# Patient Record
Sex: Female | Born: 1935 | Race: White | Hispanic: No | Smoking: Former smoker
Health system: Southern US, Community
[De-identification: ages and names within clinical notes are randomized; demographics above are authoritative.]

## PROBLEM LIST (undated history)

## (undated) DIAGNOSIS — I2510 Atherosclerotic heart disease of native coronary artery without angina pectoris: Secondary | ICD-10-CM

## (undated) DIAGNOSIS — Z87440 Personal history of urinary (tract) infections: Secondary | ICD-10-CM

## (undated) DIAGNOSIS — K219 Gastro-esophageal reflux disease without esophagitis: Secondary | ICD-10-CM

## (undated) DIAGNOSIS — Z8719 Personal history of other diseases of the digestive system: Secondary | ICD-10-CM

## (undated) DIAGNOSIS — N183 Chronic kidney disease, stage 3 unspecified: Secondary | ICD-10-CM

## (undated) DIAGNOSIS — E039 Hypothyroidism, unspecified: Secondary | ICD-10-CM

## (undated) DIAGNOSIS — E785 Hyperlipidemia, unspecified: Secondary | ICD-10-CM

## (undated) DIAGNOSIS — J449 Chronic obstructive pulmonary disease, unspecified: Secondary | ICD-10-CM

## (undated) DIAGNOSIS — G309 Alzheimer's disease, unspecified: Secondary | ICD-10-CM

## (undated) DIAGNOSIS — F419 Anxiety disorder, unspecified: Secondary | ICD-10-CM

## (undated) DIAGNOSIS — F0280 Dementia in other diseases classified elsewhere without behavioral disturbance: Secondary | ICD-10-CM

## (undated) DIAGNOSIS — I4891 Unspecified atrial fibrillation: Principal | ICD-10-CM

## (undated) DIAGNOSIS — I10 Essential (primary) hypertension: Secondary | ICD-10-CM

## (undated) HISTORY — PX: OTHER SURGICAL HISTORY: SHX169

## (undated) HISTORY — PX: TOTAL VAGINAL HYSTERECTOMY: SHX2548

## (undated) HISTORY — PX: ABDOMINAL HYSTERECTOMY: SHX81

## (undated) HISTORY — PX: CORONARY ANGIOPLASTY WITH STENT PLACEMENT: SHX49

---

## 1997-07-29 ENCOUNTER — Ambulatory Visit (HOSPITAL_COMMUNITY): Admission: RE | Admit: 1997-07-29 | Discharge: 1997-07-29 | Payer: Self-pay | Admitting: Specialist

## 1998-09-27 ENCOUNTER — Encounter: Payer: Self-pay | Admitting: Neurological Surgery

## 1998-09-27 ENCOUNTER — Ambulatory Visit (HOSPITAL_COMMUNITY): Admission: RE | Admit: 1998-09-27 | Discharge: 1998-09-27 | Payer: Self-pay | Admitting: Neurological Surgery

## 1998-12-16 ENCOUNTER — Ambulatory Visit (HOSPITAL_COMMUNITY): Admission: RE | Admit: 1998-12-16 | Discharge: 1998-12-16 | Payer: Self-pay | Admitting: Neurological Surgery

## 1998-12-16 ENCOUNTER — Encounter: Payer: Self-pay | Admitting: Neurological Surgery

## 1999-07-28 ENCOUNTER — Encounter: Admission: RE | Admit: 1999-07-28 | Discharge: 1999-07-28 | Payer: Self-pay | Admitting: Internal Medicine

## 1999-07-28 ENCOUNTER — Encounter: Payer: Self-pay | Admitting: Internal Medicine

## 1999-08-25 ENCOUNTER — Ambulatory Visit (HOSPITAL_COMMUNITY): Admission: RE | Admit: 1999-08-25 | Discharge: 1999-08-26 | Payer: Self-pay | Admitting: Cardiology

## 2000-05-03 ENCOUNTER — Other Ambulatory Visit: Admission: RE | Admit: 2000-05-03 | Discharge: 2000-05-03 | Payer: Self-pay | Admitting: *Deleted

## 2000-08-11 ENCOUNTER — Encounter: Admission: RE | Admit: 2000-08-11 | Discharge: 2000-08-11 | Payer: Self-pay | Admitting: Internal Medicine

## 2000-08-11 ENCOUNTER — Encounter: Payer: Self-pay | Admitting: Internal Medicine

## 2001-11-08 ENCOUNTER — Encounter: Admission: RE | Admit: 2001-11-08 | Discharge: 2001-11-08 | Payer: Self-pay | Admitting: Internal Medicine

## 2001-11-08 ENCOUNTER — Encounter: Payer: Self-pay | Admitting: Internal Medicine

## 2002-08-18 ENCOUNTER — Encounter: Payer: Self-pay | Admitting: Emergency Medicine

## 2002-08-19 ENCOUNTER — Inpatient Hospital Stay (HOSPITAL_COMMUNITY): Admission: EM | Admit: 2002-08-19 | Discharge: 2002-08-21 | Payer: Self-pay | Admitting: Emergency Medicine

## 2002-11-12 ENCOUNTER — Encounter: Payer: Self-pay | Admitting: Internal Medicine

## 2002-11-12 ENCOUNTER — Encounter: Admission: RE | Admit: 2002-11-12 | Discharge: 2002-11-12 | Payer: Self-pay | Admitting: Internal Medicine

## 2004-02-17 ENCOUNTER — Ambulatory Visit (HOSPITAL_COMMUNITY): Admission: RE | Admit: 2004-02-17 | Discharge: 2004-02-17 | Payer: Self-pay | Admitting: Internal Medicine

## 2005-02-16 ENCOUNTER — Ambulatory Visit (HOSPITAL_COMMUNITY): Admission: RE | Admit: 2005-02-16 | Discharge: 2005-02-16 | Payer: Self-pay | Admitting: Internal Medicine

## 2005-10-12 ENCOUNTER — Emergency Department (HOSPITAL_COMMUNITY): Admission: EM | Admit: 2005-10-12 | Discharge: 2005-10-12 | Payer: Self-pay | Admitting: Emergency Medicine

## 2006-02-22 ENCOUNTER — Ambulatory Visit (HOSPITAL_COMMUNITY): Admission: RE | Admit: 2006-02-22 | Discharge: 2006-02-22 | Payer: Self-pay | Admitting: Internal Medicine

## 2007-02-01 ENCOUNTER — Encounter: Payer: Self-pay | Admitting: Emergency Medicine

## 2007-02-01 ENCOUNTER — Inpatient Hospital Stay (HOSPITAL_COMMUNITY): Admission: AD | Admit: 2007-02-01 | Discharge: 2007-02-10 | Payer: Self-pay | Admitting: Internal Medicine

## 2007-02-06 ENCOUNTER — Encounter (INDEPENDENT_AMBULATORY_CARE_PROVIDER_SITE_OTHER): Payer: Self-pay | Admitting: Internal Medicine

## 2007-03-30 ENCOUNTER — Inpatient Hospital Stay (HOSPITAL_COMMUNITY): Admission: EM | Admit: 2007-03-30 | Discharge: 2007-03-31 | Payer: Self-pay | Admitting: Emergency Medicine

## 2007-04-05 ENCOUNTER — Ambulatory Visit: Payer: Self-pay | Admitting: Gastroenterology

## 2007-10-27 ENCOUNTER — Ambulatory Visit (HOSPITAL_COMMUNITY): Admission: RE | Admit: 2007-10-27 | Discharge: 2007-10-27 | Payer: Self-pay | Admitting: Cardiology

## 2008-06-05 ENCOUNTER — Encounter: Admission: RE | Admit: 2008-06-05 | Discharge: 2008-06-05 | Payer: Self-pay | Admitting: Cardiology

## 2008-06-14 ENCOUNTER — Inpatient Hospital Stay (HOSPITAL_COMMUNITY): Admission: AD | Admit: 2008-06-14 | Discharge: 2008-06-15 | Payer: Self-pay | Admitting: Cardiology

## 2008-08-02 ENCOUNTER — Ambulatory Visit (HOSPITAL_COMMUNITY): Admission: RE | Admit: 2008-08-02 | Discharge: 2008-08-02 | Payer: Self-pay | Admitting: Cardiology

## 2009-01-10 ENCOUNTER — Ambulatory Visit (HOSPITAL_COMMUNITY): Admission: RE | Admit: 2009-01-10 | Discharge: 2009-01-10 | Payer: Self-pay | Admitting: Family Medicine

## 2009-01-23 ENCOUNTER — Inpatient Hospital Stay (HOSPITAL_COMMUNITY): Admission: EM | Admit: 2009-01-23 | Discharge: 2009-01-25 | Payer: Self-pay | Admitting: Emergency Medicine

## 2009-05-10 ENCOUNTER — Inpatient Hospital Stay (HOSPITAL_COMMUNITY): Admission: EM | Admit: 2009-05-10 | Discharge: 2009-05-12 | Payer: Self-pay | Admitting: Emergency Medicine

## 2009-07-01 ENCOUNTER — Encounter: Admission: RE | Admit: 2009-07-01 | Discharge: 2009-07-01 | Payer: Self-pay | Admitting: Family Medicine

## 2010-02-09 ENCOUNTER — Observation Stay (HOSPITAL_COMMUNITY)
Admission: EM | Admit: 2010-02-09 | Discharge: 2010-02-10 | Payer: Self-pay | Source: Home / Self Care | Admitting: Emergency Medicine

## 2010-06-16 LAB — CBC
Hemoglobin: 12.7 g/dL (ref 12.0–15.0)
MCH: 29 pg (ref 26.0–34.0)
MCHC: 32.6 g/dL (ref 30.0–36.0)
MCV: 89 fL (ref 78.0–100.0)
Platelets: 161 10*3/uL (ref 150–400)
RDW: 13.9 % (ref 11.5–15.5)
WBC: 10.9 10*3/uL — ABNORMAL HIGH (ref 4.0–10.5)

## 2010-06-16 LAB — URINE CULTURE
Colony Count: >=100000
Culture  Setup Time: 201111071322

## 2010-06-16 LAB — URINALYSIS, ROUTINE W REFLEX MICROSCOPIC
Bilirubin Urine: NEGATIVE
Ketones, ur: NEGATIVE mg/dL
Nitrite: POSITIVE — AB
Protein, ur: >300 mg/dL — AB
Urobilinogen, UA: 0.2 mg/dL (ref 0.0–1.0)

## 2010-06-16 LAB — BASIC METABOLIC PANEL
Calcium: 8.5 mg/dL (ref 8.4–10.5)
GFR calc Af Amer: 45 mL/min — ABNORMAL LOW (ref >60)
GFR calc Af Amer: 53 mL/min — ABNORMAL LOW (ref >60)
GFR calc non Af Amer: 37 mL/min — ABNORMAL LOW (ref >60)
GFR calc non Af Amer: 43 mL/min — ABNORMAL LOW (ref >60)
Glucose, Bld: 120 mg/dL — ABNORMAL HIGH (ref 70–99)
Sodium: 137 mEq/L (ref 135–145)
Sodium: 141 mEq/L (ref 135–145)

## 2010-06-16 LAB — POCT CARDIAC MARKERS
CKMB, poc: 1.7 ng/mL (ref 1.0–8.0)
Troponin i, poc: <0.05 ng/mL (ref 0.00–0.09)

## 2010-06-16 LAB — POCT I-STAT, CHEM 8
Calcium, Ion: 1.07 mmol/L — ABNORMAL LOW (ref 1.12–1.32)
Chloride: 107 mEq/L (ref 96–112)
Glucose, Bld: 124 mg/dL — ABNORMAL HIGH (ref 70–99)
HCT: 41 % (ref 36.0–46.0)
TCO2: 27 mmol/L (ref 0–100)

## 2010-06-16 LAB — PROTIME-INR: INR: 0.97 (ref 0.00–1.49)

## 2010-06-16 LAB — URINE MICROSCOPIC-ADD ON

## 2010-06-16 LAB — DIFFERENTIAL
Lymphocytes Relative: 12 % (ref 12–46)
Lymphs Abs: 1.3 10*3/uL (ref 0.7–4.0)
Monocytes Relative: 6 % (ref 3–12)
Neutro Abs: 8.8 10*3/uL — ABNORMAL HIGH (ref 1.7–7.7)

## 2010-06-16 LAB — TROPONIN I: Troponin I: 0.01 ng/mL (ref 0.00–0.06)

## 2010-06-16 LAB — CK TOTAL AND CKMB (NOT AT ARMC): Total CK: 129 U/L (ref 7–177)

## 2010-06-16 LAB — T4, FREE: Free T4: 0.68 ng/dL — ABNORMAL LOW (ref 0.80–1.80)

## 2010-06-16 LAB — TSH: TSH: 23.079 u[IU]/mL — ABNORMAL HIGH (ref 0.350–4.500)

## 2010-06-16 LAB — APTT: aPTT: 28 seconds (ref 24–37)

## 2010-06-16 LAB — T3: T3, Total: 91.1 ng/dl (ref 80.0–204.0)

## 2010-06-16 LAB — CARDIAC PANEL(CRET KIN+CKTOT+MB+TROPI)
Relative Index: INVALID (ref 0.0–2.5)
Total CK: 102 U/L (ref 7–177)
Total CK: 96 U/L (ref 7–177)

## 2010-06-24 LAB — CBC
HCT: 28.9 % — ABNORMAL LOW (ref 36.0–46.0)
HCT: 32 % — ABNORMAL LOW (ref 36.0–46.0)
MCHC: 33.7 g/dL (ref 30.0–36.0)
MCHC: 33.7 g/dL (ref 30.0–36.0)
MCV: 87.3 fL (ref 78.0–100.0)
MCV: 87.8 fL (ref 78.0–100.0)
MCV: 88.5 fL (ref 78.0–100.0)
Platelets: 168 10*3/uL (ref 150–400)
Platelets: 181 10*3/uL (ref 150–400)
Platelets: 199 10*3/uL (ref 150–400)
RBC: 3.32 MIL/uL — ABNORMAL LOW (ref 3.87–5.11)
RBC: 3.65 MIL/uL — ABNORMAL LOW (ref 3.87–5.11)
RDW: 13.4 % (ref 11.5–15.5)
RDW: 13.7 % (ref 11.5–15.5)
WBC: 6.7 10*3/uL (ref 4.0–10.5)
WBC: 6.9 10*3/uL (ref 4.0–10.5)
WBC: 9.6 10*3/uL (ref 4.0–10.5)

## 2010-06-24 LAB — CK TOTAL AND CKMB (NOT AT ARMC): Relative Index: 3.8 — ABNORMAL HIGH (ref 0.0–2.5)

## 2010-06-24 LAB — URINALYSIS, ROUTINE W REFLEX MICROSCOPIC
Hgb urine dipstick: NEGATIVE
Ketones, ur: NEGATIVE mg/dL
Nitrite: NEGATIVE
Protein, ur: 100 mg/dL — AB
Specific Gravity, Urine: 1.021 (ref 1.005–1.030)
Urobilinogen, UA: 0.2 mg/dL (ref 0.0–1.0)

## 2010-06-24 LAB — BASIC METABOLIC PANEL
BUN: 20 mg/dL (ref 6–23)
CO2: 26 mEq/L (ref 19–32)
Calcium: 9.5 mg/dL (ref 8.4–10.5)
Chloride: 102 mEq/L (ref 96–112)
Creatinine, Ser: 1.19 mg/dL (ref 0.4–1.2)
Creatinine, Ser: 1.29 mg/dL — ABNORMAL HIGH (ref 0.4–1.2)
GFR calc Af Amer: 48 mL/min — ABNORMAL LOW (ref >60)
GFR calc non Af Amer: 40 mL/min — ABNORMAL LOW (ref >60)
Glucose, Bld: 151 mg/dL — ABNORMAL HIGH (ref 70–99)

## 2010-06-24 LAB — CARDIAC PANEL(CRET KIN+CKTOT+MB+TROPI)
CK, MB: 6.6 ng/mL (ref 0.3–4.0)
Relative Index: 5.4 — ABNORMAL HIGH (ref 0.0–2.5)
Total CK: 109 U/L (ref 7–177)
Total CK: 123 U/L (ref 7–177)
Troponin I: 0.02 ng/mL (ref 0.00–0.06)
Troponin I: 0.02 ng/mL (ref 0.00–0.06)

## 2010-06-24 LAB — POCT CARDIAC MARKERS

## 2010-06-24 LAB — LIPID PANEL
Total CHOL/HDL Ratio: 1.9 RATIO
Triglycerides: 69 mg/dL (ref <150)
VLDL: 14 mg/dL (ref 0–40)

## 2010-06-24 LAB — BRAIN NATRIURETIC PEPTIDE: Pro B Natriuretic peptide (BNP): 614 pg/mL — ABNORMAL HIGH (ref 0.0–100.0)

## 2010-06-24 LAB — HEPATIC FUNCTION PANEL
ALT: 11 U/L (ref 0–35)
Indirect Bilirubin: 0.3 mg/dL (ref 0.3–0.9)
Total Protein: 5.9 g/dL — ABNORMAL LOW (ref 6.0–8.3)

## 2010-06-24 LAB — DIFFERENTIAL
Basophils Absolute: 0 10*3/uL (ref 0.0–0.1)
Basophils Relative: 0 % (ref 0–1)
Monocytes Absolute: 0.2 10*3/uL (ref 0.1–1.0)
Neutro Abs: 11.1 10*3/uL — ABNORMAL HIGH (ref 1.7–7.7)
Neutrophils Relative %: 93 % — ABNORMAL HIGH (ref 43–77)

## 2010-06-24 LAB — HEPARIN LEVEL (UNFRACTIONATED): Heparin Unfractionated: 0.41 IU/mL (ref 0.30–0.70)

## 2010-06-24 LAB — LIPASE, BLOOD: Lipase: 25 U/L (ref 11–59)

## 2010-06-24 LAB — HEMOGLOBIN A1C
Hgb A1c MFr Bld: 6 % (ref 4.6–6.1)
Mean Plasma Glucose: 126 mg/dL

## 2010-06-24 LAB — TSH: TSH: 1.539 u[IU]/mL (ref 0.350–4.500)

## 2010-06-24 LAB — PROTIME-INR: INR: 1.06 (ref 0.00–1.49)

## 2010-06-24 LAB — AMYLASE: Amylase: 82 U/L (ref 0–105)

## 2010-07-09 LAB — CBC
HCT: 30.3 % — ABNORMAL LOW (ref 36.0–46.0)
HCT: 36.8 % (ref 36.0–46.0)
Hemoglobin: 10.3 g/dL — ABNORMAL LOW (ref 12.0–15.0)
MCHC: 34.1 g/dL (ref 30.0–36.0)
MCHC: 34.8 g/dL (ref 30.0–36.0)
MCV: 87.4 fL (ref 78.0–100.0)
MCV: 87.6 fL (ref 78.0–100.0)
Platelets: 171 10*3/uL (ref 150–400)
Platelets: 178 10*3/uL (ref 150–400)
RBC: 3.43 MIL/uL — ABNORMAL LOW (ref 3.87–5.11)
RBC: 4.21 MIL/uL (ref 3.87–5.11)
RDW: 13.2 % (ref 11.5–15.5)
RDW: 13.4 % (ref 11.5–15.5)
WBC: 4.9 10*3/uL (ref 4.0–10.5)
WBC: 6.1 10*3/uL (ref 4.0–10.5)

## 2010-07-09 LAB — CK TOTAL AND CKMB (NOT AT ARMC)
CK, MB: 11.5 ng/mL — ABNORMAL HIGH (ref 0.3–4.0)
Total CK: 308 U/L — ABNORMAL HIGH (ref 7–177)

## 2010-07-09 LAB — BASIC METABOLIC PANEL
BUN: 22 mg/dL (ref 6–23)
BUN: 28 mg/dL — ABNORMAL HIGH (ref 6–23)
BUN: 31 mg/dL — ABNORMAL HIGH (ref 6–23)
CO2: 21 mEq/L (ref 19–32)
CO2: 29 mEq/L (ref 19–32)
Calcium: 8.4 mg/dL (ref 8.4–10.5)
Calcium: 8.6 mg/dL (ref 8.4–10.5)
Chloride: 101 mEq/L (ref 96–112)
Creatinine, Ser: 1.19 mg/dL (ref 0.4–1.2)
Creatinine, Ser: 1.2 mg/dL (ref 0.4–1.2)
GFR calc Af Amer: 53 mL/min — ABNORMAL LOW (ref >60)
GFR calc non Af Amer: 51 mL/min — ABNORMAL LOW (ref >60)
Glucose, Bld: 134 mg/dL — ABNORMAL HIGH (ref 70–99)
Potassium: 3.9 mEq/L (ref 3.5–5.1)
Potassium: 4.2 mEq/L (ref 3.5–5.1)
Sodium: 136 mEq/L (ref 135–145)

## 2010-07-09 LAB — LIPID PANEL
LDL Cholesterol: 59 mg/dL (ref 0–99)
Total CHOL/HDL Ratio: 2.6 RATIO
VLDL: 19 mg/dL (ref 0–40)

## 2010-07-09 LAB — PROTIME-INR
INR: 1.2 (ref 0.00–1.49)
INR: 1.2 (ref 0.00–1.49)
Prothrombin Time: 15.1 seconds (ref 11.6–15.2)

## 2010-07-09 LAB — HEPATIC FUNCTION PANEL
Albumin: 4.4 g/dL (ref 3.5–5.2)
Total Protein: 6.9 g/dL (ref 6.0–8.3)

## 2010-07-09 LAB — URINE MICROSCOPIC-ADD ON

## 2010-07-09 LAB — POCT CARDIAC MARKERS
CKMB, poc: 10.7 ng/mL (ref 1.0–8.0)
Myoglobin, poc: 268 ng/mL (ref 12–200)
Troponin i, poc: <0.05 ng/mL (ref 0.00–0.09)

## 2010-07-09 LAB — CARDIAC PANEL(CRET KIN+CKTOT+MB+TROPI)
CK, MB: 5.9 ng/mL — ABNORMAL HIGH (ref 0.3–4.0)
Relative Index: 3.6 — ABNORMAL HIGH (ref 0.0–2.5)
Total CK: 163 U/L (ref 7–177)
Troponin I: 0.02 ng/mL (ref 0.00–0.06)

## 2010-07-09 LAB — URINALYSIS, ROUTINE W REFLEX MICROSCOPIC
Bilirubin Urine: NEGATIVE
Ketones, ur: NEGATIVE mg/dL
Leukocytes, UA: NEGATIVE
Nitrite: NEGATIVE
Protein, ur: 30 mg/dL — AB
Urobilinogen, UA: 0.2 mg/dL (ref 0.0–1.0)

## 2010-07-09 LAB — HEMOGLOBIN A1C
Hgb A1c MFr Bld: 5.6 % (ref 4.6–6.1)
Mean Plasma Glucose: 114 mg/dL

## 2010-07-09 LAB — TSH: TSH: 5.614 u[IU]/mL — ABNORMAL HIGH (ref 0.350–4.500)

## 2010-07-09 LAB — TROPONIN I: Troponin I: 0.02 ng/mL (ref 0.00–0.06)

## 2010-07-15 LAB — BASIC METABOLIC PANEL
Calcium: 9 mg/dL (ref 8.4–10.5)
Creatinine, Ser: 1 mg/dL (ref 0.4–1.2)
GFR calc Af Amer: >60 mL/min (ref >60)
Sodium: 139 mEq/L (ref 135–145)

## 2010-07-15 LAB — PROTIME-INR
INR: 4.5 — ABNORMAL HIGH (ref 0.00–1.49)
Prothrombin Time: 47.3 seconds — ABNORMAL HIGH (ref 11.6–15.2)

## 2010-07-15 LAB — APTT: aPTT: 45 seconds — ABNORMAL HIGH (ref 24–37)

## 2010-07-16 LAB — CARDIAC PANEL(CRET KIN+CKTOT+MB+TROPI)
CK, MB: 3 ng/mL (ref 0.3–4.0)
Relative Index: 2.5 (ref 0.0–2.5)
Total CK: 122 U/L (ref 7–177)

## 2010-07-16 LAB — BASIC METABOLIC PANEL
Calcium: 9 mg/dL (ref 8.4–10.5)
GFR calc Af Amer: >60 mL/min (ref >60)
GFR calc non Af Amer: 52 mL/min — ABNORMAL LOW (ref >60)
Potassium: 4.4 mEq/L (ref 3.5–5.1)
Sodium: 137 mEq/L (ref 135–145)

## 2010-07-16 LAB — CBC
Hemoglobin: 11.8 g/dL — ABNORMAL LOW (ref 12.0–15.0)
RBC: 4.09 MIL/uL (ref 3.87–5.11)
RDW: 14.2 % (ref 11.5–15.5)
WBC: 6.9 10*3/uL (ref 4.0–10.5)

## 2010-07-16 LAB — PROTIME-INR: INR: 1.1 (ref 0.00–1.49)

## 2010-08-18 NOTE — Cardiovascular Report (Signed)
NAMEFOY, MUNGIA                 ACCOUNT NO.:  [id number]   MEDICAL RECORD NO.:  [id number]          PATIENT TYPE:  INP   LOCATION:  2501                         FACILITY:  [facility]   PHYSICIAN:  Cristy Hilts. Jacinto Halim, MD       DATE OF BIRTH:  [birth year]   DATE OF PROCEDURE:  06/14/2008  DATE OF DISCHARGE:                            CARDIAC CATHETERIZATION   PROCEDURES PERFORMED:  Left heart catheterization including:  1. Left ventriculography.  2. Selective right and left coronary arteriography.  3. Percutaneous transluminal coronary angioplasty and stenting of the      proximal right coronary artery.  4. Right femoral arteriography and closure of the right femoral      arterial access with [employer].   INDICATIONS:  [name] is a pleasant 77 year old female with  known coronary artery disease.  She had undergone balloon angioplasty to  the mid LAD in 2001.  She also had moderate disease in the right  coronary artery, which she had intravascular ultrasound interrogation in  2004 and was opted for medical therapy.  She was admitted with non-Q-  wave myocardial infarction in December 2008 with a negative nuclear  stress test, hence was being medically treated.  Again however she had  developed atrial fibrillation and she underwent a stress test to exclude  ischemic etiology, and this revealed severe inferior wall ischemia.  Given this, she was brought to the cardiac catheterization lab to  evaluate her coronary anatomy.   HEMODYNAMIC DATA:  The left ventricular pressure was 106/4 with an end-  diastolic pressure of 11 mmHg.  Aortic pressure was 108/52 with a mean  of 76 mmHg.  There is no significant pressure gradient across the aortic  valve.   ANGIOGRAPHIC DATA:  Left ventricle:  Left ventricular systolic function  was normal with an ejection fraction of 65%.  There was no significant  wall motion abnormality or mitral regurgitation.   Right coronary artery:  Right coronary  artery is a large caliber and a  super dominant vessel.  It supplies a large part of the posterolateral  wall and inferior wall.  It has got diffuse mild calcification.  The  proximal segment shows a focal 80% stenoses.  This was progressed from  prior cardiac catheterization.   Left main coronary artery:  Left main coronary artery is large-caliber  vessel.  It has got mild calcification.   Circumflex artery:  Circumflex artery is a very small vessel.  It has  got mild luminal irregularity.   LAD:  LAD is a moderate caliber vessel, gives origin to several small to  moderate sized diagonals.  The LAD has diffuse 30-40% luminal  irregularity.   INTERVENTION DATA:  Successful PTCA and direct stenting of the proximal  right coronary artery with implantation of a 4.0 x 18 mm Vision stent  deployed at a peak of 16 atmospheric pressure.  The stenosis was reduced  from 80% to 0% with brisk TIMI 3 to TIMI-3 flow maintained at the end of  the procedure.   RECOMMENDATIONS:  The patient is on Coumadin for  atrial fibrillation.  I  am going to restart her back on Coumadin.  She will need Plavix at least  for a period of 6 weeks, then she will be on Coumadin and aspirin only.  She will be discharged home on Coumadin plus Plavix.   A total of 130 mL of contrast was utilized for diagnostic angiography.   I will probably attempt to cardiovert her in 3-4 weeks if she does not  spontaneously convert after her revascularization of the right coronary  artery.   TECHNIQUE OF THE PROCEDURE:  Under usual sterile precautions, using a 6-  French right femoral arterial access and 6-Frech multipurpose B2  catheter was advanced into ascending aorta, then the left ventricle.  The catheter was advanced into the left ventricle, left ventricular  pressure was monitored, catheter was pulled into the ascending aorta,  right coronary artery was selectively engaged, and angiography was  performed.  The left main  coronary artery was selectively engaged and  angiography was performed, then catheter advanced into the left  ventricle.  Left ventriculography was performed both in LAO and RAO  projection.  Catheter was pulled out of the body.   Using Angiomax for anticoagulation, a 6-French JR4 guide with side hole  was utilized to engage the right coronary artery, and using ATW  guidewire, I directly stented this with a 4.0 x 18 mm Vision stent at 12  atmospheric pressure.  I gently pulled the balloon proximally and second  inflation was performed at 16 atmospheric pressure.  Excellent position  of the balloon was noted, then the wire was withdrawn, angiography  repeated, guide catheter pulled out of the body.  The patient tolerated  the procedure.  No immediate complications.      Cristy Hilts. Jacinto Halim, MD  Electronically Signed     JRG/MEDQ  D:  06/14/2008  T:  06/14/2008  Job:  [id number]   cc:   Holley Bouche, M.D.

## 2010-08-18 NOTE — Assessment & Plan Note (Signed)
[name]                                 ON-CALL NOTE   NAME:FIELDSGenavie, [name]                          MRN:          [id number]  DATE:03/30/2007                            DOB:          [birth year]    TELEPHONE NOTE:  [name] called early this a.m. stating that she is passing bright red  blood per rectum.  Two days prior, she underwent an upper endoscopy and  colonoscopy with polypectomy.  She is without abdominal pain.   I instructed [name] to go to [facility] where she will be  evaluated.     Barbette Hair. Arlyce Dice, MD,FACG  Electronically Signed    RDK/MedQ  DD: 03/30/2007  DT: 03/30/2007  Job #: [id number]   cc:   Jordan Hawks. Elnoria Howard, MD

## 2010-08-18 NOTE — Discharge Summary (Signed)
[name], [name]                 ACCOUNT NO.:  [id number]   MEDICAL RECORD NO.:  [id number]          PATIENT TYPE:  INP   LOCATION:  3706                         FACILITY:  [facility]   PHYSICIAN:  Isidor Holts, M.D.  DATE OF BIRTH:  1926-04-05   DATE OF ADMISSION:  02/01/2007  DATE OF DISCHARGE:                               DISCHARGE SUMMARY   DATE OF DISCHARGE:  To be determined.   PMD:  Unassigned.   PRIMARY CARDIOLOGIST:  Dr. Aram Candela. Tysinger   DISCHARGE DIAGNOSES:  1. Paroxysmal atrial fibrillation.  2. Acute coronary syndrome.  3. Hypertension.  4. History of coronary artery disease, status post PCI to LAD.  5. Sepsis secondary to E-coli and Klebsiella pneumoniae UTI/E-coli      bacteremia.  6. Aspiration  7. History of gout.  8. History of osteoarthritis.  9. History of degenerative joint disease  status post back surgery.  10.Iron deficiency anemia.   DISCHARGE MEDICATIONS:  These will be listed in addendum at the  appropriate time by discharging MD   CONSULTATIONS:  1. Dr. Charolette Child,  cardiologist.   PROCEDURES:  1. Chest x-ray dated 02/01/2007.  This showed minimal bibasilar      atelectasis without acute findings.  2. Chest CT angiogram dated 02/01/2007.  This showed no evidence of      pulmonary embolism.  There was small left pleural effusion.  3. Chest x-ray dated 02/03/2007.  This showed new bibasilar airspace      disease, left greater than right, suspicious for aspiration and/or      multilobar pneumonia.  4. Abdominal x-ray dated 02/03/2007.  This showed nonobstructive bowel      gas pattern.   ADMISSION HISTORY:  As in H&P notes of 02/01/2007, dictated by Dr.  Michaelyn Barter.  However in brief, this is a 77 year old female, with  known history of coronary artery disease status post cardiac  catheterization 08/05/2002 which revealed a 80% stenosis of LAD addressed  with PTCA.  History of herniated disk, osteoarthritis, gout,  hypertension,  macular degeneration, previous hysterectomy, status post  bilateral cataract removal, status post back surgery, who presents with  palpitations and recurrent chest pain.  In addition she had a pyrexia of  101.  She was admitted for further evaluation, investigation and  management.   CLINICAL COURSE.:  1. Paroxysmal atrial fibrillation.  The patient at the time of      presentation, was in rapid atrial fibrillation with a ventricular      response rate in the 140s.  This was addressed with intravenous      infusion of Cardizem.  Cardiac enzymes were cycled.  Chest x-ray      demonstrated no acute pulmonary pathology.  Chest CT angiogram was      unremarkable for pulmonary embolism.  Cardiology consultation was      provided by Dr. Charolette Child.  The patient was placed on      intravenous infusion of heparin with a view to possible direct      current cardioversion.  The patient's heart rate became adequately  controlled with Cardizem infusion as well as beta-blocker      treatment.  As of 02/06/2007 she had reverted to sinus rhythm with      frequent PACs. The idea of possible cardioversion has been      abandoned and decision at present is to manage the patient      medically.   1. Acute coronary syndrome.  The patient's cardiac enzymes were cycled      and was found to be elevated, ranging from about 0.78-1.01, raising      the specter of possible sub-endocardial ischemia versus acute      coronary syndrome.  The patient has been continued on intravenous      heparin during the course of her hospitalization and beta-blocker      treatment.  2-D echocardiogram has been requested although report      is not yet available at the time of this dictation on 02/06/2007.      Per cardiology it is likely that the patient may indeed due course,      require cardiac catheterization.   1. Sepsis.  As mentioned in the admission history above, the patient      was noted to have a pyrexia of  101.  She continued to spike pyrexia      in the initial few days of hospitalization.  Septic workup was done      and urinalysis demonstrated positive urinary sediment consistent      with urinary tract infection.  The patient was therefore commenced      on intravenous Ciprofloxacin.  Subsequent urine cultures grew E-      coli, as well as Klebsiella pneumonia sensitive to Ciprofloxacin.      However, the patient's blood cultures also grew E-coli.  It is      clear that the patient has urosepsis secondary to gram-negative      organisms.  She has done well with antibiotic therapy, defervesced      and her clinical wellbeing significantly improved as of 02/06/2007.      At the time of this dictation she was on day #6 of Ciprofloxacin      therapy, i.e. on 02/06/2007.  A total course of antibiotic therapy      of 14 days is anticipated, although she may be transitioned to oral      Ciprofloxacin, in due course.   1. Aspiration.  The patient on 02/03/2007 felt very unwell, with      nausea, abdominal cramps and vomiting.  Chest auscultation revealed      bibasilar and expiratory crackles.  Chest x-ray done on the same      day revealed bilateral airspace disease which was new, compared to      initial admitting chest x-ray.  Clearly the patient had aspirated.      She was therefore commenced on Zosyn intravenously and speech      pathology consultation was requested.  The patient however, showed      no evidence of aspiration and has been continued on a regular diet.      Repeat chest x-ray of 02/06/2007 showed no evidence of pulmonary      consolidation.  Clearly the patient has only aspirated without      actually having developed a pneumonia.  Zosyn was therefore      discontinued after 4 days of treatment.   1. Hypertension.  This was addressed with a combination of  Imdur,  calcium channel blocker, ACE inhibitor and beta-blocker.   1. Iron-deficiency anemia.  The patient was  found to have an anemia      with hemoglobin ranging between 8.8 and 9.0 during the course of      this hospitalization.  Iron studies showed an iron of 25, TIBC 238,      percentage saturation 11, ferritin 534, B12 was 1184, folate over      20.  These findings appeared consistent with iron-deficiency      anemia.  The patient has been commenced on iron supplements      accordingly.  Also stool guaiac studies have been requested but at      the time of this dictation, results were still pending.   1. History of gout. There have been no problems referable to this,      during the course of the patient's hospitalization.   DISPOSITION:  This to be detailed in addendum at the appropriate time,  by discharging MD.  However, it is anticipated that over the next few  days the patient will recover sufficiently to be discharged. The  question of possible cardiac catheterization will be addressed by  cardiologist, at the appropriate time.      Isidor Holts, M.D.  Electronically Signed     CO/MEDQ  D:  02/06/2007  T:  02/07/2007  Job:  [id number]   cc:   Antionette Char, MD

## 2010-08-18 NOTE — Discharge Summary (Signed)
[name], BRALLEY                 ACCOUNT NO.:  [id number]   MEDICAL RECORD NO.:  [id number]          PATIENT TYPE:  INP   LOCATION:  2501                         FACILITY:  [facility]   PHYSICIAN:  Cristy Hilts. Jacinto Halim, MD       DATE OF BIRTH:  01-05-1926   DATE OF ADMISSION:  06/14/2008  DATE OF DISCHARGE:  06/15/2008                               DISCHARGE SUMMARY   DISCHARGE DIAGNOSES:  1. Atrial fibrillation secondary to ischemia.  2. Coronary artery disease with history of angioplasty to the right      coronary artery in 2004.      a.     Severe inferior wall ischemia on lexiscan Myoview.      b.     Right coronary artery stenosis with percutaneous       transluminal coronary arteriography and Multilink Vision stent.      c.     Nonobstructive disease within the small circumflex and the       left anterior descending coronary artery.      d.     Ejection fraction 65%.  3. Hypertension.  4. Anticoagulation with Lovenox, Coumadin, and Plavix; and then      Coumadin and Plavix for 6 weeks; then aspirin and Coumadin.   DISCHARGE CONDITION:  Improved.   PROCEDURES:  1. Combined left heart cath, June 14, 2008, by Dr. Yates Decamp, MD.  2. June 14, 2008, PTCA and stent deployment to the RCA with the      Multilink Vision stent.   DISCHARGE MEDICATIONS:  1. Imdur 30 mg daily.  2. Zocor 40 mg daily.  3. Lopressor 50 mg twice a day.  4. Quinapril 40 mg daily.  5. Cardizem CD 180 mg daily.  6. Do not take omeprazole use Pepcid 40 mg daily.  7. Potassium 10 mEq daily.  8. Lasix 20 mg daily.  9. Digoxin 0.125 mg daily.  10.Lovenox injection 60 mg subcu twice a day.  11.Aricept 5 mg daily.  12.Plavix 75 mg daily.  Do not stop, stopping can cause a heart      attack.  13.Coumadin 3 mg tablet take 2 tablets tonight, June 15, 2008; 2      tablets, June 16, 2008; then resume 1-1/2 tablets, which is equal      to 4.5 mg daily.   DISCHARGE INSTRUCTIONS:  1. Low-sodium, heart-healthy diet.  2. Wash cath site with soap and water.  3. Call if any bleeding, swelling, or drainage.  4. Increase activity slowly.  May shower.  No lifting for 2 days.  No      driving.  5. Follow up with Dr. Jacinto Halim in 2 weeks.  The office will call with      date and time.  6. Follow up with [facility] on Monday.   Please note, the patient was also given a prescription for nitroglycerin  1/150 one sublingual for chest pain, may have one every 5 minutes, up to  2 tablets every 10 minutes while sitting.  If the pain continues, call  [phone].   HISTORY  OF PRESENT ILLNESS:  An 77 year old female patient of Dr. Jacinto Halim  with a history of right coronary angioplasty in 2004 and history of non-  Q-wave MI in December 2008 with a negative nuclear stress test after the  hospital discharge, has been managed medically until the patient began  having atrial fibrillation with rapid ventricular response.  She was  arranged to undergo stress test.  The concern was this was ischemic  mediated atrial fib.  She had the stress test which was positive for  severe inferior wall ischemia, which was new and was followed up with  Dr. Jacinto Halim.  The patient complained of extreme fatigue, but no chest  pain, shortness of breath, or paroxysmal nocturnal dyspnea or orthopnea.   She was arranged to come in for cardiac catheterization on June 14, 2008, when she did underwent cath and then stent to the RCA.  The  patient did well and by the next morning, she had no complaints.  Vital  signs were stable.  She ambulated with cardiac rehab without problems  and was ready for discharge home.   PAST MEDICAL HISTORY:  As stated.   ALLERGIES:  CODEINE, VALIUM, DARVOCET, MORPHINE, B12, and FLU VACCINE.   FAMILY HISTORY:  See H&P.   SOCIAL HISTORY:  See H&P.   REVIEW OF SYSTEMS:  See H&P.   PHYSICAL EXAMINATION AT DISCHARGE:  VITAL SIGNS:  Blood pressure 119/81,  pulse 72, respiratory rate 17, temperature was 99.1 at the peak,  oxygen  saturation 95%.  HEART:  S1 and S2, irregularly irregular.  LUNGS:  Clear.  ABDOMEN: Positive bowel sounds.  EXTREMITIES: Without edema.  Right groin cath site was stable.   DISCHARGE LABORATORIES:  Hemoglobin 11.8, hematocrit 34.7, and platelets  203, WBC 6.9.  Sodium 137, potassium 4.4, BUN 18, creatinine 1.02,  glucose 117, INR of 1.1.  CK-MBs postprocedure 122-132, CKs MBs 3.01 and  3.4, troponin I 0.03-0.01.   EKG postprocedure atrial fibrillation.  Nonspecific ST-T wave  abnormalities.  After the patient ambulated, she was discharged home and  will follow up as an outpatient.      [name]. [name], [name]      Cristy Hilts. Jacinto Halim, MD  Electronically Signed    LRI/MEDQ  D:  07/26/2008  T:  07/27/2008  Job:  [id number]   cc:   Holley Bouche, M.D.

## 2010-08-18 NOTE — Discharge Summary (Signed)
NAMESTARLA, DELLER                 ACCOUNT NO.:  [id number]   MEDICAL RECORD NO.:  [id number]          PATIENT TYPE:  INP   LOCATION:  3706                         FACILITY:  [facility]   PHYSICIAN:  Hettie Holstein, D.O.    DATE OF BIRTH:  30-Apr-1925   DATE OF ADMISSION:  02/01/2007  DATE OF DISCHARGE:  02/10/2007                               DISCHARGE SUMMARY   MEDICATION ADDENDUM  Please append to job number [id number]    This is to address medications as well as the subsequent course. Mrs.  Terrones hospital course has been that of a improvement and she it was  otherwise uneventful.  She underwent a consultation by Dr. Aleen Campi  who  had extended discussions with Mrs. Schmit at which point it was  ultimately concluded that her cardiac catheterization be deferred until  resolution of her acute illness concludes, she was diagnosed with E-coli  sepsis with most recent blood cultures performed on October 31 revealing  no growth to date, on appropriate therapy with quinolones.  She had some  spikes of temperature in the evenings.  This began to subside. We are  transitioning her home on Levaquin to complete another week's course and  follow-up with Dr. Aleen Campi next week for blood work.  She had a  exacerbation of her gout which promptly responded to multiple doses of  colchicine, enabling her to ambulate pain free.  In any event, she was  on heparin drip for acute coronary syndrome.  I discussed this with Dr.  Aleen Campi and due to her diagnosed history of warfarin intolerance  ?allergy with reference her paroxysmal atrial fibrillation.  This will  be further deferred to address in the outpatient setting by her  cardiologist.  Mrs. Glanzer upon discussion states that she cannot recall  the reaction she had with warfarin but feels as though this was  predominantly gastrointestinal with some nausea.  This was quite a long  time ago. I suspect that Dr. Aleen Campi may have records as to what her  intolerances were with regards to her warfarin in the clinic records and  she states that she amendable to trying warfarin again if this is  implemented in the outpatient setting, as we certainly have no clear  history of where her reaction was to this medication.  In any event,  also she had some loose stools with implementation of colchicine though  her gout responded.  Her creatinine went up slightly to 1.47.  For this  reason I am decreasing her ACE inhibitor and recommending follow-up  basic metabolic panel to assure that her renal function remains intact.   MEDICATIONS ON DISCHARGE:  1. Levaquin 500 mg daily for another weeks duration  2. Metoprolol 50 mg twice daily.  She is dispense #30 to conduct      refills of these medications through her primary care physician.  3. Lisinopril 10 mg daily, dispense #15, to receive subsequent refills      through her primary care physician.  4. Cardizem CD 180 mg daily  5. She will resume her allopurinol 150 mg daily  as before  6. Aspirin 81 mg daily,  7. Atenolol she would discontinue and replace this with metoprolol as      prescribed  8. Isosorbide mononitrate 30 mg daily as before  9. Lasix 20 mg daily as before, with instructions to have a basic      metabolic panel performed within the next week  10.Protonix 40 mg as before  11.Plavix as prescribed by Dr. Aleen Campi at 75 daily  12.I will also write a p.r.n. dose of colchicine that she can take      during her gouty flares.      Hettie Holstein, D.O.  Electronically Signed     ESS/MEDQ  D:  02/10/2007  T:  02/10/2007  Job:  [id number]   cc:   Antionette Char, MD

## 2010-08-18 NOTE — H&P (Signed)
[name], [name]                 ACCOUNT NO.:  [id number]   MEDICAL RECORD NO.:  [id number]          PATIENT TYPE:  INP   LOCATION:  1234                         FACILITY:  [facility]   PHYSICIAN:  Barbette Hair. Arlyce Dice, MD,FACGDATE OF BIRTH:  04-22-1925   DATE OF ADMISSION:  03/30/2007  DATE OF DISCHARGE:  03/31/2007                              HISTORY & PHYSICAL   CHIEF COMPLAINT:  Rectal bleeding.   [name] is a pleasant 77 year old white female admitted with  hematochezia.  Two days ago she underwent colonoscopy and upper  endoscopy for workup of an iron deficiency anemia.  Endoscopy was  pertinent only for a single duodenal diverticulum.  On colonoscopy,  several dimunitive polyps were removed with snare, possibly cautery and  cold snare.  Few diverticula were seen.  Capsule endoscopy was ordered  for further workup of her iron deficiency anemia.  Early this a.m., she  developed painless hematochezia.  She has had several bloody bowel  movements subsequently.  In the emergency room, her hemoglobin was 8.4.  It had previously been approximately 11.   PAST MEDICAL HISTORY:  Pertinent for coronary artery disease.  She is  status post MI.  She apparently had two MIs in October 2008.  She has  hypertension.   FAMILY HISTORY:  Noncontributory.   MEDICATIONS:  Isordil, Lopressor, Plavix, diltiazem, Maxzide, Zocor,  allopurinol, Protonix, and quinapril.   ALLERGIES:  CODEINE AND DARVOCET.   SOCIAL HISTORY:  She neither smokes or drinks.  She is widowed and lives  alone.   REVIEW OF SYSTEMS:  Reviewed and is negative.   PHYSICAL EXAMINATION:  VITAL SIGNS:  Pulse 73, blood pressure 124/69.  She is afebrile.  She is anicteric.  HEENT:  Within normal limits.  CHEST:  Clear.  CARDIAC:  No murmurs, gallops, or rubs.  ABDOMEN:  She has hyperactive bowel sounds.  She has mild right lower  quadrant tenderness without guarding or rebound.  There are no abdominal  masses or  organomegaly.  EXTREMITIES:  Without cyanosis, clubbing, or edema.  RECTAL:  She has frank blood in the rectal area.   Hemoglobin is 8.4, MCV is 82, platelet count 219.  Electrolytes were  within normal limits.  Glucose is 143.  BUN 31, creatinine 1.16.   IMPRESSION:  1. Acute lower gastrointestinal (GI) bleed.  This very likely is a      post polypectomy bleed.  2. Coronary artery disease.  3. History of iron deficiency anemia.  4. Hypertension.   RECOMMENDATIONS:  1. Transfuse 2 units of packed cells.  We will keep hemoglobin not      less than 9, up to 10.  2. Protonix.  3. Observe over the next 12 to 24 hours.  If bleeding continues, then      I will prep her for colonoscopy with the intention of cauterizing      or otherwise treating a bleeding source.  4. Hold Plavix.      Barbette Hair. Arlyce Dice, MD,FACG  Electronically Signed     RDK/MEDQ  D:  03/30/2007  T:  03/30/2007  Job:  [id number]   cc:   [name]. Teresa Howard, MD  Fax: [phone]   [name]. Teresa Halim, MD  Fax: [phone]

## 2010-08-18 NOTE — H&P (Signed)
[name], [name]                 ACCOUNT NO.:  [id number]   MEDICAL RECORD NO.:  [id number]          PATIENT TYPE:  EMS   LOCATION:  ED                           FACILITY:  [facility]   PHYSICIAN:  Michaelyn Barter, M.D. DATE OF BIRTH:  February 12, 1926   DATE OF ADMISSION:  02/01/2007  DATE OF DISCHARGE:                              HISTORY & PHYSICAL   CHIEF COMPLAINT:  Chest pain and fast heartbeat.   HISTORY OF PRESENT ILLNESS:  [name] is a 77 year old female with the  past medical history of coronary artery disease, status post stent  placement.  She indicates that she experienced at least one episode of a  rapid heartbeat approximately two months ago, which was shortly lived.  She did not have any repeat episodes up until this morning at  approximately 2:30 a.m.  She indicates that, shortly after waking up,  she felt her heart beating very fast.  She stated that it felt as if her  heart was going to leave her chest.  Shortly afterwards, she became  diaphoretic and felt very weak.  She indicates that she also experienced  chest pain off and on since approximately 2:30 this morning.  She has  had at least three episodes of chest pain.  Later this morning, she  became nauseated and experienced at least one episode of emesis.  She  indicates that she had a fever this past Sunday of 101.  She denies any  cough, no shortness of breath.  She had at least one episode of  diarrhea.  Currently, she indicates that she feels very cold and she  states that this is new for her.  She denies having any current chest  pain.   PAST MEDICAL HISTORY:  1. Coronary artery disease.  The patient underwent a cardiac      catheterization, Aug 20, 2002, by Dr. Aleen Campi.  At that particular      time, the cardiac cath report indicated that the patient's left      ventricular function was normal.  It appears that the patient also      underwent a prior cardiac catheterization, back in May of 2001.  At  that time, Dr. Aleen Campi also performed the procedure.  It appeared      that the mid-LAD had a focal 80% stenosis at that particular time      and patient underwent PTCA.  2. Herniated disc.  3. Hyperglycemia.  4. Osteoarthritis.  5. Gout.  6. Angina.  7. Vertigo.  8. Hypertension.  9. Macular degeneration.  10.Tachycardia.  11.Palpitations.   PAST SURGICAL HISTORY:  1. Hysterectomy, secondary to menorrhagia.  2. Bilateral cataract removal.  3. Back surgery.   ALLERGIES:  1. DARVOCET.  2. CODEINE.  3. VALIUM.  4. COUMADIN.  The patient actually indicates that Coumadin makes her      sick on the stomach.  Therefore, this does not sound like a true      allergy, per se.   CURRENT HOME MEDICATIONS:  1. Allopurinol.  The patient currently takes 150 mg p.o. daily.  2.  Aspirin 81 mg p.o. daily.  3. Atenolol 25 mg p.o. b.i.d.  4. Isosorbide mononitrate 30 mg p.o. daily.  5. Lasix 20 mg p.o. daily.  6. Lisinopril/hydrochlorothiazide 20/12.5 p.o. daily.  7. Pantoprazole 40 mg p.o. daily.  8. Plavix 75 mg p.o. daily.   FAMILY HISTORY:  The patient's father died at the age of 79, secondary  to a heart attack.  The patient's mother died at the age of 38.  She  suffered from heart disease.  The patient also has one brother who died  at the age of 45 from an MI.  A second brother died from heart-related  problems.   SOCIAL HISTORY:  Cigarettes:  The patient started smoking at the age of  89.  She smoked up to one half pack of cigarettes per day.  She stopped  smoking approximately 40 years ago.  Alcohol:  The patient drinks  alcohol only occasionally.   REVIEW OF SYSTEMS:  Positive for chest pain, positive for nausea and  vomiting, positive for diarrhea.  All other systems are as per HPI.   PHYSICAL EXAM:  GENERAL:  The patient is awake.  She is cooperative.  She is in no obvious respiratory distress.  VITALS:  Her temperature is 97.2, blood pressure 138/75.  Heart rate:   Prior to IV Diltiazem being started, the patient's heart rate was noted  to be in the 140s.  After Diltiazem was started, the patient's heart  rate declined into the 60s and 50s.  Respiratory rate 16, O2 sat is 99%.  HEENT:  Normocephalic, atraumatic, anicteric.  Extraocular movements are  intact.  Oral mucosa is pink.  The patient has dentures present.  No  thrush is noted in the posterior region of the patient's oral mucosa.  NECK:  Supple, no JVD, no lymphadenopathy, no thyromegaly.  CARDIAC:  Heart sounds are very distant.  RESPIRATORY:  Breath sounds are slightly decreased bilaterally.  No  crackles or wheezes are auscultated.  ABDOMEN:  Flat.  There is a well-healed surgical scar, distal to the  patient's umbilicus.  Her abdomen is soft, nontender, nondistended.  Bowel sounds are present times four quadrants, no masses are palpated.  EXTREMITIES:  No leg edema appreciated.  NEUROLOGICALLY:  The patient is alert and oriented times three.  Cranial  nerves II through XII are intact.  Gag reflex was not assessed, however,  secondary to the patient's complaint of nausea and previous episodes of  vomiting.  MUSCULOSKELETAL:  5/5 upper and lower extremity strength.   LABORATORY DATA:  The patient's hemoglobin is 11.6, hematocrit is 33.5,  white blood cell count 14.7, platelets are 239.  Sodium is 140,  potassium 3.6, chloride 100, CO2 26, BUN is 28, creatinine 1.24, glucose  191, calcium 9.3.  PTT 37, PT 15.2, INR is 1.2.  The patient had a  urinalysis completed.  Nitrites are positive, moderate leukocytes are  present.  WBC are 21-50, bacteria - many are noted.   The patient had an initial EKG completed at 4:46 this morning, which  reveals atrial fibrillation with RVR.  No obvious Q-wave abnormalities  were noted.  No obvious ST segment abnormalities were noted.  At  approximately 11:39, the patient had a repeat EKG completed, which  reveals atrial fibrillation with a slow RVR.    ASSESSMENT AND PLAN:  1. Newly-diagnosed atrial fibrillation with RVR.  Cardiology, i.e. Dr.      Aleen Campi, has already seen the patient.  The patient has been  started on the Cardizem IV drip and, as a result, the patient's      heart rate has declined significantly.  When I reviewed the      telemetry monitor, it appears that the patient currently is going      in and out of A-fib with occasional episodes of normal sinus      rhythm.  Dr. Aleen Campi, again, has seen the patient and it appears,      per his note, that consideration is currently being given to      electrically cardioverting the patient sometime tomorrow morning.      Given the fact that the patient's heart rate has currently declined      I will go ahead and discontinue the patient's IV Cardizem and      convert her over to oral Cardizem.  Likewise, will also check a TSH      and T4.  2. Chest pain.  This most likely was related to the episode of atrial      fibrillation with RVR.  Given the fact that the patient does have a      significant cardiovascular history, we will cycle the patient's      cardiac enzymes, consisting of troponin I and CK-MB times three q.8      hours apart as an attempt to rule the patient out for cardiac      event.  3. Urinary tract infection.  Currently, the patient does not complain      of any urinary-related symptoms.  However, she does complain of      nausea and vomiting.  Whether or not these are related is      questionable.  We will start the patient on empiric IV      ciprofloxacin.  We will also check a urine culture.  4. Hypertension.  The patient's blood pressure is currently      acceptable.  We will resume her prior antihypertensive medication      and titrate the dosages up as necessary.  5. Nausea and vomiting.  We will start the patient on p.r.n.      antiemetics.  6. Gastrointestinal prophylaxis.  We will provide Protonix.  7. For DVT prophylaxis, we will provide  Lovenox.      Michaelyn Barter, M.D.  Electronically Signed     OR/MEDQ  D:  02/01/2007  T:  02/01/2007  Job:  [id number]   cc:   Antionette Char, MD  Fax: [phone]

## 2010-08-21 NOTE — Cardiovascular Report (Signed)
[place]. [facility]  Patient:    [name], [name]                        MRN: [id number] Proc. Date: 08/25/99 Adm. Date:  [id number] Disc. Date: [id number] Attending:  Silvestre Mesi CC:         Jaclyn Prime. Lucas Mallow, M.D.             John R. Aleen Campi, M.D.             Cardiac Catheterization Lab                        Cardiac Catheterization  REFERRING PHYSICIAN:  Jaclyn Prime. Lucas Mallow, M.D.  PROCEDURES: 1. Left heart catheterization. 2. Coronary cineangiography. 3. Left internal mammary artery cineangiography. 4. Left ventricular cineangiography. 5. Abdominal aortogram. 6. PTCA of the mid LAD. 7. PTCA of the second diagonal branch.  INDICATIONS FOR PROCEDURE:  This 77 year old female has a five-year history of angina.  She had a prior cardiac catheterization on Aug 25, 1995, showing moderate stenosis of her mid LAD and severe stenosis of her second anterolateral branch, along with minor irregularity in the dominant right coronary artery.  She now returns with unstable angina and a treadmill exercise tolerance test done on Aug 20, 1999 was early double-positive for chest pain and ST segment changes.  She is now scheduled for cardiac catheterization and possible angioplasty.  DESCRIPTION OF PROCEDURE:  After signing an informed consent, the patient was premedicated with 50 mg of Benadryl intravenously and brought to the cardiac catheterization lab.  The right groin was prepped and draped in a sterile fashion, and anesthetized locally with 1% lidocaine.  Six-French introducer sheath was inserted percutaneously into the right femoral artery.  Then, 6-French #4 Judkins coronary catheters were used to make injections into the coronary arteries.  The right coronary catheter was used to make a midstream injection into the left subclavian, visualizing the left internal mammary artery.  A 6-French pigtail catheter was used to measure pressures in the left ventricle and  aorta, and to make midstream injections into the left ventricle and abdominal aorta.  After noting marked progression of disease in the mid LAD lesion, along with progression of disease in the second anterolateral branch, we elected to proceed with angioplasty of this site.  PERCUTANEOUS PROCEDURE:  We selected a JL4 7-French guiding catheter, and after exchanging the 6-French introducer sheath in the right femoral artery for a 7-French sheath, the 7-French guiding catheter was inserted through this new sheath and advanced to the aorta.  Two short Hi-Torque Floppy guide wires were then inserted through a double Tuohy and advanced to the tip of the guide catheter.  After engaging the tip of the guide catheter in the ostium of the left coronary artery, both guide wires were advanced into the LAD; the first wire was advanced through the mid LAD lesion and into the distal segment.  The second wire was then advanced into the mid LAD and directed into the second anterolateral branch.  We then selected a 2.5 x 10 mm balloon catheter (a Maverick model).  This was advanced over the first guide wire and positioned within the mid LAD lesion.  Three inflations were made, with the maximum pressure of 16 atm at a maximum time of 62 sec.  The balloon catheter was then removed and injection into the left coronary artery showed a good angiographic result  in the mid LAD lesion, with a very minor dissection line on the side of the second anterolateral branch.  It also showed further compromise of the second anterolateral branch ostial lesion.  We then reinserted this same balloon over the second wire; positioned it within the second anterolateral ostial lesion.  One inflation was made at 2 atm for 63 sec.  After this final inflation, the balloon catheter was removed and injection again in the left coronary artery showed very good angiographic result in both lesions, resolution of the minor dissection line,  which was previously present; and, reestablishment of normal antegrade flow.  There was no clot and no further evidence of dissection.  The patient tolerated the procedure well.  No complications are noted at the end of the procedure.  The catheter was removed from the right femoral artery sheath, and the sheath was sutured in place using 1-0 silk.  She was then admitted to 6500 for further monitoring.  MEDICATIONS GIVEN:  ReoPro per pharmacy protocol.  Heparin 5400 units IV.  HEMODYNAMIC DATA: 1. Left ventricular pressure:  150/10-26. 2. Aortic pressure:  148/59 with a mean of 88. 3. Left ventricular ejection fraction estimated at 60%.  CINEANGIOGRAPHIC FINDINGS: 1. Left Coronary Artery:  The ostium and left main appeared normal.  The left    main is a short structure, followed by an early takeoff of the small    diminutive circumflex coronary artery.  This small circumflex is    mildly irregular, but has antegrade flow throughout. 2. Left Anterior Descending:  The proximal LAD has a focal concentric lesion    of 30-40% prior to the takeoff of the first septal and first diagonal    branch.  This is mildly calcified.  The mid LAD has a focal 80% stenosis in    the area of the takeoff of the second anterolateral branch.  The ostium of    the second anterolateral branch is involved with this lesion, and has a    90% ostial lesion. 3. Right Coronary Artery:  The ostium appears normal.  There is a 30-40%    segmental lesion in the proximal segment, just after the takeoff of the    conus branch.  The right coronary artery then has minor irregularities    in the proximal segment down to the takeoff of the right ventricular    branch.  There are minor irregularities in the middle segment at the    acute angle.  The right coronary artery is a large, dominant vessel;    supplying the posterior descending and posterolateral circulation.  The    distal segments appear normal with antegrade  flow. 4. GRAFTS:  The left internal mammary artery appears normal.   LEFT VENTRICULAR CINEANGIOGRAM:  The left ventricle chamber size and contractility appear normal.  The ejection fraction is estimated at 60%.   The mitral and aortic valves appear normal.  ABDOMINAL AORTOGRAM:  The proximal, mid and distal abdominal aorta appear normal.  The renal arteries appear normal.  ANGIOPLASTY CINEANGIOGRAMS:  Cineangiography taken during the angioplasty procedure shows proper positioning of the two guide wires in the distal LAD and second anterolateral branch.  The cineangiogram showed proper positioning of the balloon catheter with a good balloon form obtained.  Cineangiograms after the first dilation in the mid LAD lesion shows a good angiographic result, with a 20% residual lesion and a minor dissection line in the area of the second anterolateral branch.  Further cineangiograms show proper  balloon placement in the second anterolateral branch, and final injections show a very good angiographic result with a good opening of the second anterolateral branch ostial lesion, with a 20% residual lesion and almost total resolution of the prior mentioned minor dissection.  FINAL DIAGNOSES: 1. Single-vessel coronary artery disease, with 80% mid LAD lesion and 90%    second diagonal branch ostial lesion. 2. Moderate lesion, proximal LAD of 40%. 3. Proximal right coronary artery at 40%. 4. Normal left ventricular function. 5. Normal mitral and aortic valves. 6. Normal abdominal aortogram and renal arteries. 7. Successful PTCA of the mid LAD lesion. 8. Successful PTCA of the second diagonal lesion.  DISPOSITION:  Will monitor on 6500 and continue ReoPro per pharmacy protocol. Will also start Plavix. DD:  08/25/99 TD:  08/28/99 Job: [id number]

## 2010-08-21 NOTE — Discharge Summary (Signed)
[place]. [facility]  Patient:    [name], [name]                        MRN: [id number] Adm. Date:  [id number] Disc. Date: [id number] Attending:  Silvestre Mesi Dictator:   Donzetta Matters, P.A. CC:         Trudee Kuster, M.D.                           Discharge Summary  DATE OF BIRTH:  [birth year]  PRINCIPAL DISCHARGE DIAGNOSIS:  Coronary artery disease.  OTHER DIAGNOSIS:  Hypertension.  PROCEDURES:  Left heart catheterization with angioplasty x 2, Dr. Aleen Campi.  COMPLICATIONS:  None.  CONSULTATIONS:  None.  CONDITION ON DISCHARGE:  Stable and improved.  HISTORY OF PRESENT ILLNESS:  This is a 77 year old female that was scheduled for outpatient heart catheterization when she had abnormal treadmill and continued chest discomfort.  After screening laboratories were obtained. Chest x-ray, EKG, she was then ready for the catheterization laboratory on Aug 25, 1999, with results showing single vessel coronary artery disease with 80% lesion in the mid LAD and 90% in the second diagonal lesion.  She had mild to moderate stenosis in the proximal right coronary artery, approximately 40%. She had normal left ventricular function, normal mitral and aortic valves, normal abdominal aorta and renal arteries.  She underwent successful PTCA of the mid LAD and PTCA of the second diagonal lesion.  She was then monitored overnight, with no specific problems.  Blood pressure the next day was 138/58, pulse 58, respirations 18, temperature 97.  Pulse oximetry 95% on room air. She has been up and walking this morning.  States she feels the best she has in years.  Chest is clear.  Heart is regular rate and rhythm.  Right groin does have a small area of discomfort approximately 3 cm.  She had good pulses. No problems with ambulation.  She was ambulating in the halls when I came to the unit.  It was felt that she was stable enough for discharge to home.  LABORATORY  DATA:  CBC on admission showed white count of 6400, hemoglobin 12.2, hematocrit 36.4, platelet count 263.  PTT 31.0, pro time 13.0, INR 1.12. Chemistry shows sodium 146, potassium 4.6, chloride 105, CO2 31, BUN 28, creatinine 0.6, albumin 5.2, otherwise normal liver function tests.  Normal TSH at 1.97.  Repeat CBC the next day does show hemoglobin at 10.6, hematocrit 30.0, platelet count 244.  White count 5700.  The patient was ready for discharge home on Aug 26, 1999.  MEDICATIONS: 1. New medication, Plavix 75 mg once daily, #30, prescription is written. 2. Other medications to continue:    a. Cardizem CD 180 mg daily.    b. Claritin 10 mg daily.    c. Celebrex 20 mg daily.    d. Pepcid AC daily.    e. ICAPS daily.    f. Atenolol 50 mg 1/2 tablet daily.    g. Coated aspirin 325 mg daily. 3. She is to hold her Imdur at this time.  DIET:  Low cholesterol.  ACTIVITY:  As tolerated.  WOUND CARE:  Watch the groin for any further bleeding.  FOLLOW-UP:  With Dr. Aleen Campi in two weeks.  Obtain fasting lipid profile in two weeks.  See Dr. Jearl Klinefelter in two to three weeks. DD:  08/26/99 TD:  08/29/99 Job: [id number]

## 2010-08-21 NOTE — H&P (Signed)
[facility]  Patient:    [name], [name]                       MRN: [id number] Adm. Date:  08/25/99 Attending:  Aram Candela. Aleen Campi, M.D. Dictator:   Donzetta Matters, P.A.-C. CC:         Trudee Kuster, M.D.                         History and Physical  DATE OF BIRTH:  April 08, 1925  CHIEF COMPLAINT:  Chest pain.  HISTORY OF PRESENT ILLNESS:  This is a 77 year old female who was seen in the office with history of chest pain and abnormal treadmill.  She had been having fatigue for many years.  Sometimes she would go to sleep in her chair.  She began having pain in her mid-chest with exertion over the past year.  She got worse in the last month with shortness of breath, no diaphoresis and no nausea, lasting up to two hours.  It did not awaken the patient.  Yesterday, she states her back began to hurt under the left shoulder blade.  On Aug 20, 1999, she had a double-positive treadmill and she was scheduled for heart catheterization on this date of admission.  Her primary care physician is Dr. Trudee Kuster at [facility].  PAST MEDICAL HISTORY:  She has known tachycardia.  History of herniated disk, palpitations, history of smoking, non-insulin-dependent diabetes, and hyperglycemia.  Fatigue, osteoarthritis, vertigo, angina, hypertension, kyphosis, and macular degeneration.  PAST SURGICAL HISTORY:  Hysterectomy, bilateral cataract removals, and back surgery.  ALLERGIES:  1. DARTEL.  2. DARVOCET.  3. CODEINE.  4. VALIUM.  5. VACCINES.  6. COUMADIN.  CURRENT MEDICATIONS:  1. Lasix 40 mg 1 p.r.n.  2. Imdur 30 mg 1/2 q.p.m.  3. Atenolol 50 mg 1/2 q.d.  4. Nitroglycerin 0.4 mg p.r.n.  5. Iron capsules 1 daily.  6. Pepcid AC 1 daily.  7. Multivitamin 1 daily.  8. Celebrex 200 mg daily.  9. Claritin 10 mg 1 p.r.n. 10. Benadryl 50 mg p.r.n. 11. Cardizem CD 180 1 q.d. 12. Plavix 75 mg q.d.  FAMILY HISTORY:  Father died at age 39 of  a heart attack.  Mother died at age 72 of heart disease.  One brother with coronary artery bypass grafting x 4. One sister with mitral valve prolapse.  SOCIAL HISTORY:  She is widowed.  She does drink caffeine.  She is retired and is a nonsmoker now.  Denies any alcohol use.  Denies any drug use.  She is right handed.  She also states she eats lots of candy.  REVIEW OF SYSTEMS:  Denies any fevers, chills, sweating.  Her weight is fluctuating.  She does have edema in her hands, as well as ankles.  She does wear glasses.  She states no problems with her vision.  She denies any problems with glaucoma.  She has had bilateral cataract extractions.  She does wear a hearing aid.  She states she does have some ringing in the ears.  She does have some seasonal allergies.  She does have a sore on her lower lip. She does have full dentures, upper and lower.  Cardiovascular:  Chest pain as noted above.  She has had some tachycardia.  She states she has shortness of breath.  She does have some cough, which does produce a clear sputum.  No wheeze.  Denies any dysphagia.  No nausea and no vomiting.  Some heartburn is noted, for which she uses over-the-counter Pepcid.  Denies any diarrhea or constipation.  No difficulty with urinating.  She is status post hysterectomy. She states she does have painful joints in her back, knees, and hands. Myalgias are mainly in her legs.  She does have some fatigue but states she is steady in her walking.  Denies any problems with skin rashes.  Dizziness has improved over time.  She states she does have some stress and worry.  She states she is a borderline diabetic.  Denies any thyroid disease.  She states she bruises easily.  Again, her allergies are DARVOCET, VACCINE, COUMADIN, CODEINE, and VALIUM.  NONDRUG ALLERGIES: 1. HOUSEHOLD CHEMICALS. 2. PESTICIDES.  PHYSICAL EXAMINATION:  VITAL SIGNS:  She is afebrile.  Pulse 52, respirations 16, blood pressure 140/68,  weight 161 pounds.  GENERAL:  A well-developed, well-nourished white female at the present time in no acute distress.  HEENT:  She is normocephalic, atraumatic.  Pupils equal and reactive at 2 mm. Her mouth is moist.  Dentures in.  NECK:  Supple with midline trachea.  No JVD, no bruits, no thyromegaly.  No cervical adenopathy.  CHEST:  Clear to auscultation.  She does have kyphosis.  BREASTS:  Normal contour.  No discharge or tenderness.  HEART:  Regular rate and rhythm.  Normal S1, S2.  No murmurs, rubs, gallops, or clicks noted.  ABDOMEN:  Nondistended, nontender, and active bowel sounds.  She is nontender over the bladder.  EXTREMITIES:  She moves both upper and lower extremities without any difficulty.  Strength is 5/5.  Normal capillary refill.  SKIN:  Warm and dry without jaundice, cyanosis, pallor, or rashes.  Nails are somewhat brittle.  NEUROLOGIC:  She is oriented to person, place, time, and situation.  Cranial nerves grossly intact.  Speech is clear.  There is no obvious resting tremor.  LABORATORY DATA:  The treadmill on Aug 20, 1999 showed double-positive test. Chest x-ray on Aug 24, 1999 showed no acute disease.  IMPRESSION: 1. Exertional angina. 2. Fatigue. 3. Brittle nails. 4. Dyspnea.  PLAN:  Screening labs on admission, which does show on CBC a white count at 6400, hemoglobin 12.2, hematocrit 36.4, platelet count 263.  PTT is 31.  Pro time is 13.0.  INR is 1.12.  Chemistries show sodium at 146, potassium 4.7, chloride 105, CO2 31, BUN 28, creatinine 0.6, glucose 90.  Albumin is slightly elevated at 5.2.  Otherwise, normal liver function tests.  TSH is 1.97, which is normal range.  She is then scheduled for a heart catheterization today with possible angioplasty as indicated. DD:  08/25/99 TD:  08/25/99 Job: [id number]

## 2010-08-21 NOTE — Cardiovascular Report (Signed)
NAME:  [name], [name]                           ACCOUNT NO.:  [id number]   MEDICAL RECORD NO.:  [id number]                   PATIENT TYPE:  INP   LOCATION:  2867                                 FACILITY:  [facility]   PHYSICIAN:  Aram Candela. Tysinger, M.D.              DATE OF BIRTH:  06-09-1925   DATE OF PROCEDURE:  08/20/2002  DATE OF DISCHARGE:                              CARDIAC CATHETERIZATION   PROCEDURES:  1. Left heart catheterization.  2. Coronary cineangiography.  3. Intravascular ultrasound of the right coronary artery.  4. Left ventricular cineangiography.  5. Perclose of the right femoral artery.   INDICATION FOR PROCEDURES:  This 77 year old female has a history of  coronary artery disease, status post coronary artery angioplasty of her mid  LAD and second diagonal branch in May of 2001.  She has had episodes of PAF  requiring medication which made her heart rate lower to the point of  symptomatology and the medications were decreased.  She was admitted  yesterday because of another episode of atrial fibrillation with a rapid  ventricular response which was markedly symptomatic.  Her CK-MB enzymes were  abnormal with elevation on admission of 278, which rose on her fourth  determination to 836.  Her CK-MB on admission was 7.0 and this also  increased to 22.7.  Her troponin remained normal.  With the enzyme elevation  and previous history of coronary artery disease, she was brought to [facility] for cardiac catheterization.   PROCEDURE:  After signing an informed consent, the patient was premedicated  with 5 mg of Valium by mouth and transported from her bed at [facility] to the [facility] cardiac catheterization lab.  Her right  groin was prepped and draped in a sterile fashion and anesthetized locally  with 1% lidocaine.  A 6-French introducer sheath was inserted percutaneously  into the right femoral artery.  The 6-French #4 Judkins coronary  catheters  were used to make injections into the native coronary arteries.  A 6-French  pigtail catheter was used to measure pressures in the left ventricle and  aorta and to make a mid-stream injection into the left ventricle.  After  noting a very good long-term result in the mid LAD and diagonal branch  angioplasty sites, we turned our attention to her proximal right coronary  artery lesion, which appeared to be slightly increased from 2001.  It still  did not appear to be critical, however, as it had fairly good antegrade  flow, and to make that decision, we elected to proceed with an IVUS of the  proximal right coronary lesion.  We then selected a 6-French JR4 guide  catheter, which was advanced to the root of the aorta, and after engaging  the tip of the guide catheter in the ostium of the right coronary artery, a  short HDF guidewire was easily passed into  the right coronary artery.  We  then inserted an IVUS catheter and a rate-controlled pullback was performed  using the IVUS catheter.  After studying the pullback run, we found less  plaque than expected and a maximum stenotic area of approximately 40-50%.  With this information, we felt that angioplasty should not be performed and  this apparatus was removed and after removing the catheter anesthesia sheath  from the right femoral artery, hemostasis was easily obtained with a  Perclose closure system.   MEDICATIONS GIVEN:  Heparin 3000 units IV.   HEMODYNAMIC DATA:  Left ventricular pressure 159/3-11.  Aortic pressure  155/51 with a mean of 92.  Left ventricular ejection fraction 60-70%.   CINE FINDINGS:   CORONARY CINEANGIOGRAPHY:  Left coronary artery:  The ostium and left main  appear normal.   Left anterior descending:  The LAD has mild-to-moderate plaque about the  middle segment but without a significant stenosis.  The area of angioplasty  performed in 2002 now has a very good long-term appearance and has normal   flow.  The third diagonal branch which had the angioplasty previously also  has a very good long-term appearance and has normal flow.  There is no  significant restenosis.   Circumflex coronary artery:  The circumflex is a small diminutive vessel  which appears normal and is unchanged.   Right coronary artery:  The ostium appears normal.  The proximal segment has  a plaque which is mildly eccentric and has one focal area of 40-50%  stenosis.  Following this lesion, there is a short area of minor narrowing  of less than 20%.  This is followed by a slightly dilated area and the  remainder of the large dominant right coronary artery appears normal.   LEFT VENTRICULAR CINEANGIOGRAM:  The left ventricular chamber size and  contractility appear normal without segmental abnormality.  The mitral and  aortic valves appear normal.   FINAL DIAGNOSES:  1. Very good long-term appearance of the angioplasty sites from May of 2001     in the mid left anterior descending and third diagonal branch.  2. Essentially unchanged appearance of the proximal right coronary artery     lesion, 40-50%, with good antegrade flow.  3. Normal left ventricular function.  4. Successful intravascular ultrasound procedure showing less than predicted     plaque burden in the proximal right coronary artery lesion.  5. Successful Perclose of the right femoral artery.   DISPOSITION:  We will keep the patient here at [facility] on  telemetry and review her office records for documentation of tachybrady  syndrome.  If documentation is made that she has had two anti-tachycardia  medications causing symptomatic bradycardia, she would be a candidate for  insertion of a pacemaker because of the need for medications to prevent her  PAF.                                               John R. Aleen Campi, M.D.    JRT/MEDQ  D:  08/20/2002  T:  08/20/2002  Job:  [id number]   cc:   [facility]   [facility] Medical Records

## 2010-08-21 NOTE — Discharge Summary (Signed)
[name], [name]                 ACCOUNT NO.:  [id number]   MEDICAL RECORD NO.:  [id number]          PATIENT TYPE:  INP   LOCATION:  1234                         FACILITY:  [facility]   PHYSICIAN:  Barbette Hair. Arlyce Dice, MD,FACGDATE OF BIRTH:  October 24, 1925   DATE OF ADMISSION:  03/30/2007  DATE OF DISCHARGE:  03/31/2007                               DISCHARGE SUMMARY   ADMITTING DIAGNOSES:  11. A 77 year old female with acute lower GI bleed most consistent with      post polypectomy hemorrhage.  2. Iron deficiency anemia  3. History of coronary artery disease.  4. Hypertension.   DISCHARGE DIAGNOSES:  1. Stable status post polypectomy hemorrhage requiring repeat      colonoscopy with endoclipping of ulcer site and transfusion x4.  2. Anemia acute and chronic.  3. Other diagnoses as listed above.   CONSULTATIONS:  None.   PROCEDURES:  Colonoscopy.   BRIEF HISTORY:  [name] is a 77 year old white female who was admitted by  Dr. Arlyce Dice on March 30, 2007 with an acute lower GI bleed.  The  patient had undergone colonoscopy and EGD per Dr. Elnoria Howard on the 23rd for  evaluation of an iron deficiency anemia.  The EGD showed just a single  duodenal diverticulum. Colonoscopy revealed several diminutive polyps  which were removed with snare and left-sided diverticula. She is to  undergo capsule endoscopy for further workup.  On the morning of  admission, she developed painless hematochezia with several bloody bowel  movements. She presented to the emergency room where her hemoglobin was  8.4, her baseline previously been had been around 11.  She was seen and  evaluated by Dr. Arlyce Dice and admitted with an acute post polypectomy  bleed for stabilization and medical management.   LABS ON ADMISSION:  December 25 showed a WBC of 5.6, hemoglobin 8.4,  hematocrit of 24.0, MCV of 82. Follow up later on the 25th showed a  hemoglobin of 7.5, hematocrit of 21.7. On the 26, hemoglobin 11.7,  hematocrit of 33.7.  This was repeated later in the day and was stable  after 11.7 and 33.8.  Pro time was 14.3, INR of 1.1.  Electrolytes  within normal limits.  Glucose was 143 on admission, BUN was 31,  creatinine 1.16.  Repeat on December 26 showed a BUN of 15th, creatinine  of 0.79, albumin 3.5.  Liver function studies within normal limits.   HOSPITAL COURSE:  The patient was admitted to the service of Dr. Melvia Heaps who was covering the hospital. She was initially transfused 2  units of packed RBCs, kept on a clear liquid diet and monitored. Later  that evening despite transfusion her hemoglobin had drifted down to 7.5  consistent with ongoing active bleed and she was transfused two more  units of packed RBCs.  She was given a bowel prep and underwent repeat  colonoscopy with Dr. Arlyce Dice the following morning on the 26th. She was  noted to have a solitary ulcer in the cecum approximately 15 mm  corresponding to previous polypectomy site, there was minimal oozing.  She was endoclipped  x3 with hemostasis.  The patient returned to the  floor, hemoglobin was 11.7 post transfusion, remained stable for 8  hours and she was allowed discharge to home with instructions to follow  up with Dr. Elnoria Howard in 2 weeks or sooner as needed and was to continue her  home meds as previous.   ADDENDUM:  Dictation taken entirely from the notes of Dr. Arlyce Dice.  The  patient was not seen by this dictator.      Amy Esterwood, PA-C      Robert D. Arlyce Dice, MD,FACG  Electronically Signed    AE/MEDQ  D:  05/23/2007  T:  05/24/2007  Job:  [id number]   cc:   Jordan Hawks. Elnoria Howard, MD  Fax: [phone]

## 2011-01-08 LAB — COMPREHENSIVE METABOLIC PANEL
AST: 23
Albumin: 3.5
Alkaline Phosphatase: 64
BUN: 31 — ABNORMAL HIGH
CO2: 21
Chloride: 109
Potassium: 4.6
Total Bilirubin: 0.8

## 2011-01-08 LAB — DIFFERENTIAL
Basophils Absolute: 0
Basophils Relative: 1
Lymphocytes Relative: 15
Monocytes Absolute: 0.3
Monocytes Relative: 5
Neutro Abs: 4.4
Neutrophils Relative %: 78 — ABNORMAL HIGH

## 2011-01-08 LAB — HEMOGLOBIN AND HEMATOCRIT, BLOOD: HCT: 33.7 — ABNORMAL LOW

## 2011-01-08 LAB — CBC
HCT: 24 — ABNORMAL LOW
HCT: 33.8 — ABNORMAL LOW
Hemoglobin: 11.7 — ABNORMAL LOW
Hemoglobin: 8.4 — ABNORMAL LOW
MCHC: 34.9
MCV: 82.4
MCV: 84.9
Platelets: 219
RBC: 2.91 — ABNORMAL LOW
RBC: 3.98
RDW: 14
WBC: 5.6
WBC: 6.1

## 2011-01-08 LAB — CROSSMATCH: ABO/RH(D): O POS

## 2011-01-08 LAB — APTT: aPTT: 21 — ABNORMAL LOW

## 2011-01-08 LAB — BASIC METABOLIC PANEL
Chloride: 111
GFR calc Af Amer: >60
Potassium: 4.1
Sodium: 138

## 2011-01-08 LAB — PROTIME-INR: INR: 1.1

## 2011-01-12 LAB — BASIC METABOLIC PANEL
BUN: 15
BUN: 15
BUN: 15
BUN: 21
BUN: 22
CO2: 27
CO2: 28
Calcium: 8.9
Chloride: 102
Chloride: 104
Chloride: 98
Chloride: 99
Chloride: 99
Creatinine, Ser: 1.09
Creatinine, Ser: 1.15
Creatinine, Ser: 1.19
Creatinine, Ser: 1.47 — ABNORMAL HIGH
GFR calc Af Amer: 41 — ABNORMAL LOW
GFR calc Af Amer: 53 — ABNORMAL LOW
GFR calc Af Amer: 55 — ABNORMAL LOW
GFR calc Af Amer: 59 — ABNORMAL LOW
GFR calc non Af Amer: 34 — ABNORMAL LOW
Glucose, Bld: 103 — ABNORMAL HIGH
Glucose, Bld: 107 — ABNORMAL HIGH
Glucose, Bld: 96
Potassium: 3.1 — ABNORMAL LOW
Potassium: 3.9

## 2011-01-12 LAB — IRON AND TIBC
Iron: 25 — ABNORMAL LOW
TIBC: 238 — ABNORMAL LOW
UIBC: 213

## 2011-01-12 LAB — CBC
HCT: 25.7 — ABNORMAL LOW
HCT: 32.1 — ABNORMAL LOW
MCHC: 33.8
MCHC: 33.8
MCHC: 34.1
MCV: 83.3
MCV: 83.4
MCV: 84.3
MCV: 84.5
MCV: 84.9
Platelets: 208
Platelets: 289
Platelets: 333
Platelets: 380
Platelets: 460 — ABNORMAL HIGH
RBC: 2.9 — ABNORMAL LOW
RBC: 3.09 — ABNORMAL LOW
RBC: 3.43 — ABNORMAL LOW
RBC: 3.56 — ABNORMAL LOW
RBC: 3.78 — ABNORMAL LOW
RDW: 13.8
RDW: 14
RDW: 14
RDW: 14.1 — ABNORMAL HIGH
WBC: 11 — ABNORMAL HIGH
WBC: 12.9 — ABNORMAL HIGH
WBC: 5.9
WBC: 6.7

## 2011-01-12 LAB — MAGNESIUM: Magnesium: 1.6

## 2011-01-12 LAB — HEPARIN LEVEL (UNFRACTIONATED)
Heparin Unfractionated: 0.27 — ABNORMAL LOW
Heparin Unfractionated: 0.3
Heparin Unfractionated: 0.41
Heparin Unfractionated: 0.55
Heparin Unfractionated: 0.59
Heparin Unfractionated: 0.62

## 2011-01-12 LAB — B-NATRIURETIC PEPTIDE (CONVERTED LAB): Pro B Natriuretic peptide (BNP): 295 — ABNORMAL HIGH

## 2011-01-12 LAB — FERRITIN: Ferritin: 534 — ABNORMAL HIGH (ref 10–291)

## 2011-01-13 LAB — TYPE AND SCREEN
ABO/RH(D): O POS
Antibody Screen: NEGATIVE

## 2011-01-13 LAB — URINE CULTURE: Colony Count: >=100000

## 2011-01-13 LAB — T4: T4, Total: 7.4

## 2011-01-13 LAB — CK TOTAL AND CKMB (NOT AT ARMC)
CK, MB: 4.8 — ABNORMAL HIGH
Relative Index: 1.4
Relative Index: 1.7
Total CK: 204 — ABNORMAL HIGH
Total CK: 352 — ABNORMAL HIGH
Total CK: 217 — ABNORMAL HIGH

## 2011-01-13 LAB — URINALYSIS, ROUTINE W REFLEX MICROSCOPIC
Bilirubin Urine: NEGATIVE
Glucose, UA: NEGATIVE
Ketones, ur: NEGATIVE
Ketones, ur: NEGATIVE
Nitrite: NEGATIVE
Specific Gravity, Urine: 1.017
Urobilinogen, UA: 1
pH: 6

## 2011-01-13 LAB — CBC
HCT: 26.9 — ABNORMAL LOW
HCT: 33.5 — ABNORMAL LOW
Hemoglobin: 9.2 — ABNORMAL LOW
MCHC: 34.1
MCV: 83.5
Platelets: 239
RBC: 3.03 — ABNORMAL LOW
RBC: 3.2 — ABNORMAL LOW
RDW: 13.9
RDW: 13.7
WBC: 14.7 — ABNORMAL HIGH

## 2011-01-13 LAB — DIFFERENTIAL
Basophils Absolute: 0
Eosinophils Absolute: 0
Eosinophils Relative: 0
Lymphocytes Relative: 7 — ABNORMAL LOW
Lymphs Abs: 1
Neutrophils Relative %: 88 — ABNORMAL HIGH

## 2011-01-13 LAB — CARDIAC PANEL(CRET KIN+CKTOT+MB+TROPI)
CK, MB: 3
Relative Index: 1.5
Total CK: 263 — ABNORMAL HIGH
Troponin I: 1.01

## 2011-01-13 LAB — FOLATE RBC: RBC Folate: 1840 — ABNORMAL HIGH

## 2011-01-13 LAB — BASIC METABOLIC PANEL
BUN: 28 — ABNORMAL HIGH
CO2: 26
Calcium: 8 — ABNORMAL LOW
Creatinine, Ser: 1.5 — ABNORMAL HIGH
Creatinine, Ser: 1.24 — ABNORMAL HIGH
GFR calc Af Amer: 41 — ABNORMAL LOW
GFR calc non Af Amer: 33 — ABNORMAL LOW
GFR calc non Af Amer: 42 — ABNORMAL LOW
Glucose, Bld: 191 — ABNORMAL HIGH
Potassium: 3.6

## 2011-01-13 LAB — URINE MICROSCOPIC-ADD ON

## 2011-01-13 LAB — CULTURE, BLOOD (ROUTINE X 2): Culture: NO GROWTH

## 2011-01-13 LAB — B-NATRIURETIC PEPTIDE (CONVERTED LAB)
Pro B Natriuretic peptide (BNP): 474 — ABNORMAL HIGH
Pro B Natriuretic peptide (BNP): 806 — ABNORMAL HIGH

## 2011-01-13 LAB — COMPREHENSIVE METABOLIC PANEL
AST: 28
BUN: 34 — ABNORMAL HIGH
CO2: 26
Calcium: 8.1 — ABNORMAL LOW
Chloride: 98
Creatinine, Ser: 1.63 — ABNORMAL HIGH
GFR calc non Af Amer: 30 — ABNORMAL LOW
Glucose, Bld: 157 — ABNORMAL HIGH
Total Bilirubin: 0.8

## 2011-01-13 LAB — VITAMIN B12: Vitamin B-12: 642 (ref 211–911)

## 2011-01-13 LAB — T4, FREE: Free T4: 0.88 — ABNORMAL LOW

## 2011-01-13 LAB — LIPID PANEL
HDL: 44
Total CHOL/HDL Ratio: 2.1
Triglycerides: 62
VLDL: 12

## 2011-01-13 LAB — PROTIME-INR
INR: 1.2
INR: 1.2
Prothrombin Time: 15.2
Prothrombin Time: 15.6 — ABNORMAL HIGH

## 2011-01-13 LAB — POCT CARDIAC MARKERS
Operator id: 4531
Troponin i, poc: <0.05

## 2011-01-13 LAB — D-DIMER, QUANTITATIVE
D-Dimer, Quant: 0.75 — ABNORMAL HIGH
D-Dimer, Quant: 0.94 — ABNORMAL HIGH

## 2011-01-13 LAB — ABO/RH: ABO/RH(D): O POS

## 2011-01-13 LAB — LIPASE, BLOOD: Lipase: 16

## 2011-01-13 LAB — TROPONIN I: Troponin I: 0.51

## 2011-01-13 LAB — HEPARIN LEVEL (UNFRACTIONATED)
Heparin Unfractionated: 0.36
Heparin Unfractionated: 0.48

## 2011-01-13 LAB — HEMOGLOBIN A1C: Mean Plasma Glucose: 136

## 2011-01-13 LAB — TSH: TSH: 2.164

## 2011-01-13 LAB — IRON AND TIBC

## 2011-03-18 ENCOUNTER — Emergency Department (HOSPITAL_COMMUNITY): Payer: Medicare Other

## 2011-03-18 ENCOUNTER — Encounter: Payer: Self-pay | Admitting: *Deleted

## 2011-03-18 ENCOUNTER — Inpatient Hospital Stay (HOSPITAL_COMMUNITY)
Admission: EM | Admit: 2011-03-18 | Discharge: 2011-03-21 | DRG: 310 | Disposition: A | Discharge status: Home / Self Care | Payer: Medicare Other | Attending: Internal Medicine | Admitting: Internal Medicine

## 2011-03-18 ENCOUNTER — Other Ambulatory Visit: Payer: Self-pay

## 2011-03-18 DIAGNOSIS — W19XXXA Unspecified fall, initial encounter: Secondary | ICD-10-CM

## 2011-03-18 DIAGNOSIS — J4489 Other specified chronic obstructive pulmonary disease: Secondary | ICD-10-CM | POA: Diagnosis present

## 2011-03-18 DIAGNOSIS — I10 Essential (primary) hypertension: Secondary | ICD-10-CM | POA: Diagnosis present

## 2011-03-18 DIAGNOSIS — N183 Chronic kidney disease, stage 3 unspecified: Secondary | ICD-10-CM | POA: Diagnosis present

## 2011-03-18 DIAGNOSIS — R0989 Other specified symptoms and signs involving the circulatory and respiratory systems: Secondary | ICD-10-CM | POA: Diagnosis present

## 2011-03-18 DIAGNOSIS — F0280 Dementia in other diseases classified elsewhere without behavioral disturbance: Secondary | ICD-10-CM | POA: Diagnosis present

## 2011-03-18 DIAGNOSIS — E785 Hyperlipidemia, unspecified: Secondary | ICD-10-CM | POA: Diagnosis present

## 2011-03-18 DIAGNOSIS — E876 Hypokalemia: Secondary | ICD-10-CM | POA: Diagnosis present

## 2011-03-18 DIAGNOSIS — F411 Generalized anxiety disorder: Secondary | ICD-10-CM | POA: Diagnosis present

## 2011-03-18 DIAGNOSIS — J449 Chronic obstructive pulmonary disease, unspecified: Secondary | ICD-10-CM | POA: Diagnosis present

## 2011-03-18 DIAGNOSIS — I129 Hypertensive chronic kidney disease with stage 1 through stage 4 chronic kidney disease, or unspecified chronic kidney disease: Secondary | ICD-10-CM | POA: Diagnosis present

## 2011-03-18 DIAGNOSIS — Z9181 History of falling: Secondary | ICD-10-CM

## 2011-03-18 DIAGNOSIS — Z955 Presence of coronary angioplasty implant and graft: Secondary | ICD-10-CM

## 2011-03-18 DIAGNOSIS — E039 Hypothyroidism, unspecified: Secondary | ICD-10-CM | POA: Diagnosis present

## 2011-03-18 DIAGNOSIS — I2510 Atherosclerotic heart disease of native coronary artery without angina pectoris: Secondary | ICD-10-CM | POA: Diagnosis present

## 2011-03-18 DIAGNOSIS — F419 Anxiety disorder, unspecified: Secondary | ICD-10-CM

## 2011-03-18 DIAGNOSIS — I4891 Unspecified atrial fibrillation: Principal | ICD-10-CM | POA: Diagnosis present

## 2011-03-18 DIAGNOSIS — K219 Gastro-esophageal reflux disease without esophagitis: Secondary | ICD-10-CM | POA: Diagnosis present

## 2011-03-18 DIAGNOSIS — G309 Alzheimer's disease, unspecified: Secondary | ICD-10-CM | POA: Diagnosis present

## 2011-03-18 HISTORY — DX: Alzheimer's disease, unspecified: G30.9

## 2011-03-18 HISTORY — DX: Anxiety disorder, unspecified: F41.9

## 2011-03-18 HISTORY — DX: Chronic kidney disease, stage 3 unspecified: N18.30

## 2011-03-18 HISTORY — DX: Personal history of urinary (tract) infections: Z87.440

## 2011-03-18 HISTORY — DX: Dementia in other diseases classified elsewhere, unspecified severity, without behavioral disturbance, psychotic disturbance, mood disturbance, and anxiety: F02.80

## 2011-03-18 HISTORY — DX: Hypothyroidism, unspecified: E03.9

## 2011-03-18 HISTORY — DX: Chronic kidney disease, stage 3 (moderate): N18.3

## 2011-03-18 HISTORY — DX: Gastro-esophageal reflux disease without esophagitis: K21.9

## 2011-03-18 HISTORY — DX: Atherosclerotic heart disease of native coronary artery without angina pectoris: I25.10

## 2011-03-18 HISTORY — DX: Chronic obstructive pulmonary disease, unspecified: J44.9

## 2011-03-18 HISTORY — DX: Essential (primary) hypertension: I10

## 2011-03-18 HISTORY — DX: Hyperlipidemia, unspecified: E78.5

## 2011-03-18 HISTORY — DX: Personal history of other diseases of the digestive system: Z87.19

## 2011-03-18 HISTORY — DX: Unspecified atrial fibrillation: I48.91

## 2011-03-18 LAB — PROTIME-INR
INR: 1.22 (ref 0.00–1.49)
Prothrombin Time: 15.7 seconds — ABNORMAL HIGH (ref 11.6–15.2)

## 2011-03-18 LAB — BASIC METABOLIC PANEL
BUN: 20 mg/dL (ref 6–23)
Chloride: 99 mEq/L (ref 96–112)
GFR calc non Af Amer: 47 mL/min — ABNORMAL LOW (ref >90)
Glucose, Bld: 88 mg/dL (ref 70–99)
Potassium: 3.3 mEq/L — ABNORMAL LOW (ref 3.5–5.1)

## 2011-03-18 LAB — CBC
HCT: 37.5 % (ref 36.0–46.0)
Hemoglobin: 12.4 g/dL (ref 12.0–15.0)
Hemoglobin: 10.9 g/dL — ABNORMAL LOW (ref 12.0–15.0)
MCH: 28.2 pg (ref 26.0–34.0)
MCHC: 33.1 g/dL (ref 30.0–36.0)
MCHC: 32.8 g/dL (ref 30.0–36.0)
RDW: 12.9 % (ref 11.5–15.5)

## 2011-03-18 LAB — APTT: aPTT: 31 seconds (ref 24–37)

## 2011-03-18 LAB — CREATININE, SERUM
Creatinine, Ser: 1.14 mg/dL — ABNORMAL HIGH (ref 0.50–1.10)
GFR calc non Af Amer: 43 mL/min — ABNORMAL LOW (ref >90)

## 2011-03-18 LAB — TROPONIN I: Troponin I: <0.3 ng/mL (ref <0.30)

## 2011-03-18 MED ORDER — DILTIAZEM HCL 25 MG/5ML IV SOLN: 10.0000 mg | Freq: Once | INTRAVENOUS | Status: DC

## 2011-03-18 MED ORDER — DILTIAZEM HCL ER COATED BEADS 120 MG PO CP24
120.0000 mg | ORAL_CAPSULE | Freq: Every day | ORAL | Status: DC
  Filled 2011-03-18: qty 1

## 2011-03-18 MED ORDER — HYDROCODONE-ACETAMINOPHEN 5-325 MG PO TABS
1.0000 | ORAL_TABLET | ORAL | Status: DC | PRN
  Administered 2011-03-20: 2 via ORAL
  Filled 2011-03-18: qty 2

## 2011-03-18 MED ORDER — ONDANSETRON HCL 4 MG/2ML IJ SOLN: 4.0000 mg | Freq: Four times a day (QID) | INTRAMUSCULAR | Status: DC | PRN

## 2011-03-18 MED ORDER — NITROGLYCERIN 2 % TD OINT
0.5000 [in_us] | TOPICAL_OINTMENT | Freq: Once | TRANSDERMAL | Status: AC
  Administered 2011-03-18: 0.5 [in_us] via TOPICAL
  Filled 2011-03-18: qty 30

## 2011-03-18 MED ORDER — ROSUVASTATIN CALCIUM 10 MG PO TABS
10.0000 mg | ORAL_TABLET | Freq: Every day | ORAL | Status: DC
  Administered 2011-03-19 – 2011-03-20 (×2): 10 mg via ORAL
  Filled 2011-03-18 (×5): qty 1

## 2011-03-18 MED ORDER — DEXTROSE 5 % IV SOLN
5.0000 mg/h | Freq: Once | INTRAVENOUS | Status: DC
  Filled 2011-03-18: qty 100

## 2011-03-18 MED ORDER — CHLORDIAZEPOXIDE HCL 5 MG PO CAPS: 10.0000 mg | ORAL_CAPSULE | Freq: Two times a day (BID) | ORAL | Status: DC | PRN

## 2011-03-18 MED ORDER — HEPARIN BOLUS VIA INFUSION
2500.0000 [IU] | Freq: Once | INTRAVENOUS | Status: AC
  Administered 2011-03-18: 2500 [IU] via INTRAVENOUS
  Filled 2011-03-18: qty 2500

## 2011-03-18 MED ORDER — CLOPIDOGREL BISULFATE 75 MG PO TABS
75.0000 mg | ORAL_TABLET | Freq: Every day | ORAL | Status: DC
  Administered 2011-03-19 – 2011-03-21 (×3): 75 mg via ORAL
  Filled 2011-03-18 (×4): qty 1

## 2011-03-18 MED ORDER — HEPARIN SODIUM (PORCINE) 5000 UNIT/ML IJ SOLN
5000.0000 [IU] | Freq: Three times a day (TID) | INTRAMUSCULAR | Status: DC
  Filled 2011-03-18 (×5): qty 1

## 2011-03-18 MED ORDER — DILTIAZEM LOAD VIA INFUSION
10.0000 mg | Freq: Once | INTRAVENOUS | Status: AC
  Administered 2011-03-18: 10 mg via INTRAVENOUS
  Filled 2011-03-18: qty 10

## 2011-03-18 MED ORDER — LEVOTHYROXINE SODIUM 100 MCG PO TABS
100.0000 ug | ORAL_TABLET | ORAL | Status: DC
  Administered 2011-03-19 – 2011-03-21 (×3): 100 ug via ORAL
  Filled 2011-03-18 (×6): qty 1

## 2011-03-18 MED ORDER — POLYSACCHARIDE IRON 150 MG PO CAPS
150.0000 mg | ORAL_CAPSULE | Freq: Two times a day (BID) | ORAL | Status: DC
  Administered 2011-03-19 – 2011-03-21 (×6): 150 mg via ORAL
  Filled 2011-03-18 (×11): qty 1

## 2011-03-18 MED ORDER — ALLOPURINOL 150 MG HALF TABLET
150.0000 mg | ORAL_TABLET | Freq: Every day | ORAL | Status: DC
  Administered 2011-03-19 – 2011-03-21 (×3): 150 mg via ORAL
  Filled 2011-03-18 (×4): qty 1

## 2011-03-18 MED ORDER — ALBUTEROL SULFATE (5 MG/ML) 0.5% IN NEBU: 2.5000 mg | INHALATION_SOLUTION | RESPIRATORY_TRACT | Status: DC | PRN

## 2011-03-18 MED ORDER — SODIUM CHLORIDE 0.9 % IV BOLUS (SEPSIS)
250.0000 mL | Freq: Once | INTRAVENOUS | Status: AC
  Administered 2011-03-18: 250 mL via INTRAVENOUS

## 2011-03-18 MED ORDER — DILTIAZEM HCL 90 MG PO TABS: 240.0000 mg | ORAL_TABLET | Freq: Once | ORAL | Status: DC

## 2011-03-18 MED ORDER — FAMOTIDINE 40 MG PO TABS
40.0000 mg | ORAL_TABLET | Freq: Every day | ORAL | Status: DC
  Administered 2011-03-19 – 2011-03-21 (×3): 40 mg via ORAL
  Filled 2011-03-18 (×5): qty 1

## 2011-03-18 MED ORDER — GUAIFENESIN-DM 100-10 MG/5ML PO SYRP
5.0000 mL | ORAL_SOLUTION | ORAL | Status: DC | PRN
  Filled 2011-03-18: qty 5

## 2011-03-18 MED ORDER — DILTIAZEM HCL 100 MG IV SOLR
5.0000 mg/h | Freq: Once | INTRAVENOUS | Status: AC
  Administered 2011-03-18: 5 mg/h via INTRAVENOUS
  Filled 2011-03-18: qty 100

## 2011-03-18 MED ORDER — FUROSEMIDE 20 MG PO TABS
20.0000 mg | ORAL_TABLET | Freq: Every day | ORAL | Status: DC
  Administered 2011-03-19 – 2011-03-21 (×3): 20 mg via ORAL
  Filled 2011-03-18 (×4): qty 1

## 2011-03-18 MED ORDER — ONDANSETRON HCL 4 MG PO TABS: 4.0000 mg | ORAL_TABLET | Freq: Four times a day (QID) | ORAL | Status: DC | PRN

## 2011-03-18 MED ORDER — SODIUM CHLORIDE 0.9 % IV BOLUS (SEPSIS)
500.0000 mL | Freq: Once | INTRAVENOUS | Status: AC
  Administered 2011-03-18: 500 mL via INTRAVENOUS

## 2011-03-18 MED ORDER — POTASSIUM CHLORIDE 20 MEQ/15ML (10%) PO LIQD
40.0000 meq | Freq: Once | ORAL | Status: AC
  Administered 2011-03-18: 40 meq via ORAL
  Filled 2011-03-18 (×2): qty 15

## 2011-03-18 MED ORDER — QUINAPRIL HCL 10 MG PO TABS
80.0000 mg | ORAL_TABLET | Freq: Every day | ORAL | Status: DC
  Filled 2011-03-18: qty 8

## 2011-03-18 MED ORDER — DILTIAZEM HCL ER COATED BEADS 240 MG PO CP24
240.0000 mg | ORAL_CAPSULE | Freq: Once | ORAL | Status: AC
  Administered 2011-03-18: 240 mg via ORAL
  Filled 2011-03-18: qty 1

## 2011-03-18 MED ORDER — HEPARIN SOD (PORCINE) IN D5W 100 UNIT/ML IV SOLN
750.0000 [IU]/h | INTRAVENOUS | Status: DC
  Administered 2011-03-18: 750 [IU]/h via INTRAVENOUS
  Filled 2011-03-18 (×2): qty 250

## 2011-03-18 MED ORDER — SIMVASTATIN 40 MG PO TABS: 40.0000 mg | ORAL_TABLET | Freq: Every day | ORAL | Status: DC

## 2011-03-18 MED ORDER — ISOSORBIDE MONONITRATE ER 30 MG PO TB24
30.0000 mg | ORAL_TABLET | Freq: Every day | ORAL | Status: DC
  Administered 2011-03-19 – 2011-03-21 (×3): 30 mg via ORAL
  Filled 2011-03-18 (×4): qty 1

## 2011-03-18 MED ORDER — CITALOPRAM HYDROBROMIDE 10 MG PO TABS
10.0000 mg | ORAL_TABLET | Freq: Every day | ORAL | Status: DC
  Administered 2011-03-19 – 2011-03-21 (×3): 10 mg via ORAL
  Filled 2011-03-18 (×4): qty 1

## 2011-03-18 NOTE — ED Notes (Signed)
Pt seen by wellness RN in home today, began c/o shob after cleaning around home. RN checked pulse and found it was elevated, suggested pt come to ED for eval since pt has Hx of a fib. Pt also reports chest pain/pressure that began in waiting room 6/10. Pt A&O per norm, early stage Alzheimers per son.

## 2011-03-18 NOTE — Consults (Signed)
[name] is an 77 y.o. female.   Chief Complaint: Palpitations and chest pain. HPI: [name] is 77 year old Caucasian female with history of known coronary artery disease and history of paroxysmal atrial fibrillation in the past. She had been doing well and had maintained sinus rhythm. She was not felt to be a candidate for Coumadin because of massive rectal bleed in the past in 2008 when her INR was in therapeutic range. She had been doing well until this morning when a visiting nurse was checking on her she stated that she's been having chest pain and heart racing. Her son had visited her yesterday and she felt fine. Since this morning she was not feeling well. After having checked her by the visiting nurse, the visiting nurse felt that she probably should go to the emergency department to be evaluated both for weakness, chest discomfort end palpitations. She presented to [facility] long emergency department and was found to be in atrial fibrillation with rapid ventricle response. I was called to evaluate the patient.  On further questioning, family members including her son and daughter state that patient has been very forgetful, has been missing her medications, they're also worried about her safety as at times she has walked out of the house with those wide open. She has also had several falls in the house and has bruised her some pretty bad. Patient resolve denies any shortness of breath, PND or orthopnea. She states that presently she can feel her heart going fast at times but than dates she was doing fine until this morning.  Review of systems:.   General: No fever, chills, or weight change.   Head: No headaches or migraines.   Gastrointestinal: No abdominal pain, nausea, diarrhea, or constipation.   Musculoskeletal: No joint or back pain or muscle problems.   Neurologic: No weakness, no stroke, no seizures, no numbness or tingling.   Recent diagnosis of dementia.   Endocrine: No excessive  thirst or excessive heat or cold.   Hypothyroid on replacement.   Vascular: Denies claudication, no varicose veins, no skin discoloration.   Other systems negative.  Past Medical History  Diagnosis Date  . Alzheimer disease prolapsed bladder  . Atrial fibrillation   . Anxiety   . Hypertension   . COPD (chronic obstructive pulmonary disease)   . Hx: UTI (urinary tract infection)   . Chronic kidney disease, stage III (moderate)   . GERD (gastroesophageal reflux disease)   . Dyslipidemia   . Hypothyroid   . CAD (coronary artery disease), native coronary artery     H/O PTCA, balloon angioplasty Mid LAD in 2001, 4.0x18 vision in proximal RCA 3.2010 and cardiac cath 01/2009 revealed patent PCI sites  . H/O: GI bleed     Massive rectal bleed 2008. Not a coumadin candidate. A. Fib without recurrence.      Past Surgical History  Procedure Date  . Coronary angioplasty with stent placement bladder tacking  . Myocardial infarct   . Total vaginal hysterectomy     History reviewed. No pertinent family history. Social History:  reports that she quit smoking about 52 years ago. She does not have any smokeless tobacco history on file. She reports that she drinks about .6 ounces of alcohol per week. She reports that she does not use illicit drugs.  Allergies:  Allergies  Allergen Reactions  . Codeine Nausea And Vomiting    Medications Prior to Admission  Medication Dose Route Frequency Provider Last Rate Last Dose  .  diltiazem (CARDIZEM CD) 24 hr capsule 240 mg  240 mg Oral Once Berkley Harvey, PHARMD   240 mg at 03/18/11 1730  . diltiazem (CARDIZEM) 1 mg/mL load via infusion 10 mg  10 mg Intravenous Once Nat Christen, MD   10 mg at 03/18/11 1535  . diltiazem (CARDIZEM) 100 mg in dextrose 5 % 100 mL infusion  5-15 mg/hr Intravenous Once Nat Christen, MD 10 mL/hr at 03/18/11 1609 10 mg/hr at 03/18/11 1609  . potassium chloride 20 MEQ/15ML (10%) liquid 40 mEq  40 mEq Oral Once  Nat Christen, MD   40 mEq at 03/18/11 1651  . sodium chloride 0.9 % bolus 500 mL  500 mL Intravenous Once Nat Christen, MD   500 mL at 03/18/11 1446  . DISCONTD: diltiazem (CARDIZEM) injection 10 mg  10 mg Intravenous Once Nat Christen, MD      . DISCONTD: diltiazem (CARDIZEM) tablet 240 mg  240 mg Oral Once Nat Christen, MD       No current outpatient prescriptions on file as of 03/18/2011.    Results for orders placed during the hospital encounter of 03/18/11 (from the past 48 hour(s))  CBC     Status: Normal   Collection Time   03/18/11  2:30 PM      Component Value Range Comment   WBC 6.0  4.0 - 10.5 (K/uL)    RBC 4.35  3.87 - 5.11 (MIL/uL)    Hemoglobin 12.4  12.0 - 15.0 (g/dL)    HCT 16.1  09.6 - 04.5 (%)    MCV 86.2  78.0 - 100.0 (fL)    MCH 28.5  26.0 - 34.0 (pg)    MCHC 33.1  30.0 - 36.0 (g/dL)    RDW 40.9  81.1 - 91.4 (%)    Platelets 193  150 - 400 (K/uL)   BASIC METABOLIC PANEL     Status: Abnormal   Collection Time   03/18/11  2:30 PM      Component Value Range Comment   Sodium 140  135 - 145 (mEq/L)    Potassium 3.3 (*) 3.5 - 5.1 (mEq/L)    Chloride 99  96 - 112 (mEq/L)    CO2 29  19 - 32 (mEq/L)    Glucose, Bld 88  70 - 99 (mg/dL)    BUN 20  6 - 23 (mg/dL)    Creatinine, Ser 7.82  0.50 - 1.10 (mg/dL)    Calcium 95.6  8.4 - 10.5 (mg/dL)    GFR calc non Af Amer 47 (*) >90 (mL/min)    GFR calc Af Amer 54 (*) >90 (mL/min)   PROTIME-INR     Status: Normal   Collection Time   03/18/11  2:30 PM      Component Value Range Comment   Prothrombin Time 13.7  11.6 - 15.2 (seconds)    INR 1.03  0.00 - 1.49    APTT     Status: Normal   Collection Time   03/18/11  2:30 PM      Component Value Range Comment   aPTT 31  24 - 37 (seconds)   TROPONIN I     Status: Normal   Collection Time   03/18/11  2:30 PM      Component Value Range Comment   Troponin I <0.30  <0.30 (ng/mL)    Dg Chest Portable 1 View  03/18/2011  *RADIOLOGY REPORT*  Clinical  Data: Chest pain, weakness  PORTABLE  CHEST - 1 VIEW  Comparison: Portable exam 1525 hours compared to 02/09/2010  Findings: Borderline enlargement of cardiac silhouette. Minimal pulmonary vascular congestion. Calcified tortuous aorta. Chronic peribronchial thickening and question underlying emphysematous changes. No definite acute infiltrate, pleural effusion or pneumothorax. Minimal subsegmental atelectasis at left base. Bones diffusely demineralized.  IMPRESSION: Borderline enlargement of cardiac silhouette. Emphysematous and bronchitic changes with minimal left basilar atelectasis.  Original Report Authenticated By: Lollie Marrow, M.D.     Blood pressure 118/96, pulse 120, temperature 98.1 F (36.7 C), temperature source Oral, resp. rate 19, height 5' (1.524 m), weight 63.504 kg (140 lb), SpO2 95.00%. General appearance: alert, cooperative and appears stated age Eyes: negative, conjunctivae/corneas clear. PERRL, EOM's intact. Fundi benign. Neck: no adenopathy, no carotid bruit, no JVD, supple, symmetrical, trachea midline and thyroid not enlarged, symmetric, no tenderness/mass/nodules Neck: JVP - normal, carotids 2+= without bruits Resp: clear to auscultation bilaterally Chest wall: no tenderness Cardio: irregularly irregular rhythm, S2: normal and systolic murmur: I-II/VI systolic ejection murmur in the apex. 2/6, crescendo at apex GI: soft, non-tender; bowel sounds normal; no masses,  no organomegaly Extremities: extremities normal, atraumatic, no cyanosis or edema Pulses: 2+ and symmetric Skin: Skin color, texture, turgor normal. No rashes or lesions or Mild ecchymosis right left after a recent fall noted. Neurologic: Alert and oriented X 3, normal strength and tone. Normal symmetric reflexes. Normal coordination and gait    Assessment/Plan 1.  A. Fibrillation with RVR, now rate better controlled. H/O  A.     Fibrillation in the past not felt to be a candidate for coumadin.     Presently  in sinus.  Had severe rectal bleed in the past. EKG: A. Fibrillation with RVR, non specific T change.   2. CAD/ASHD stable on appropriate medications.     H/O PTCA, balloon angioplasty Mid LAD in 2001, 4.0x18 vision in proximal RCA 3.2010 and cardiac cath 01/2009 revealed patent PCI sites.   3.   H/O GIB in the past.     Rectal Bleed in 2008. Spontaneous and not felt to be a coumadin candidate.  4. Carotid bruit.     03/09/10: carotid Mild disease. Tortuosity. 5. Hyperlipidemia:  labs from pcp 08/2010 TSH is elevated 8.31 CBC normal, s. cr1.3, TC 164, TG 97, HDL 79, LDL 59 6. Recurrent falls recently. 7. Dementia. 8. Chest pain appears to be atypical. Also family leaning towards conservative therapy. Unless recurrent chest pain, no further evaluation at this time or if EKG shows significant ischemic change, then further evalution.  Rec: Had a little discussion with the patient and her family including daughter and son. At this point and if I do not feel she is a candidate for Coumadin and given her dementia am not sure the onset of initial fibrillation, although by history it appears that she was doing well until this morning when she started to complain of palpitations and chest pain. Patient had refused pacemaker implantation in the past the family wants to have conservative approach. Hence rate control strategy always indicated. Agree with starting Cardizem CD by mouth. If rate is not well controlled we could certainly consider a beta blocker at a low dose. Patient has underlying baseline bradycardia prior to onset of atrial fibrillation. Her baseline heart rate is around 60 beats per minute. Hence I would not use high doses of combination therapy. Patient's family is very concerned about her safety at home as they have found her wandering out of the house without too and  closing the door. There also concerned about her not taking the medications and forgetting to take medications at times. They would  like to consider placement in a nursing home. Patient states that she does not want to move out of her house, one sister at home and cook her own meals. Anticoagulation for atrial fibrillation is probably risky as recently in the last one month she has had 3 falls. She has mild bruising on her right shin. Hence only aspirin is indicated. I will follow the patient again in the morning and if rate control becomes difficult then consider cardioversion. I will start the patient on IV heparin only for short duration of time for potential cardioversion if is needed. In the past she spontaneously converted to sinus rhythm and maintained sinus rhythm for several years. Hopefully she'll convert herself to sinus on Cardizem.   Pamella Pert, MD 03/18/2011, 7:27 PM

## 2011-03-18 NOTE — ED Provider Notes (Signed)
History     CSN: [id number] Arrival date & time: 03/18/2011 12:37 PM   First MD Initiated Contact with Patient 03/18/11 1411      No chief complaint on file.   (Consider location/radiation/quality/duration/timing/severity/associated sxs/prior treatment) HPI Comments: Patient with known history of atrial fibrillation who presents with some left-sided chest pain today and some shortness of breath.  Patient was doing well earlier this morning but then had a home health nurse come by for an assessment and apparently the patient and cleaning and then noted she was short of breath.  When the nurse checked the patient's heart rate she noted it to be elevated and called the son.  The son prior here to the emergency department for evaluation.  Patient has otherwise been well without fevers or coughing.  He notes that she has had this many times in the past the last 20 years and often comes in is able to be treated and have her heart rate got down and she is able to go home the next day.  She often would have any further A. fib exacerbations for many months or a year.  The son actually gives me most of the history because of the patient's mild dementia.  Patient is a 77 y.o. female presenting with chest pain. The history is provided by the patient. No language interpreter was used.  Chest Pain Primary symptoms include shortness of breath. Pertinent negatives for primary symptoms include no fever, no cough, no abdominal pain, no nausea and no vomiting.     Past Medical History  Diagnosis Date  . Alzheimer disease prolapsed bladder  . Atrial fibrillation   . Anxiety   . Hypertension   . COPD (chronic obstructive pulmonary disease)     Past Surgical History  Procedure Date  . Coronary angioplasty with stent placement bladder tacking  . Myocardial infarct   . Total vaginal hysterectomy     History reviewed. No pertinent family history.  History  Substance Use Topics  . Smoking status:  Former Smoker    Quit date: 03/18/1959  . Smokeless tobacco: Not on file  . Alcohol Use: 0.6 oz/week    1 Glasses of wine per week    OB History    Grav Para Term Preterm Abortions TAB SAB Ect Mult Living   4 4 4       4       Review of Systems  Constitutional: Negative.  Negative for fever and chills.  HENT: Negative.   Eyes: Negative.  Negative for discharge and redness.  Respiratory: Positive for shortness of breath. Negative for cough.   Cardiovascular: Positive for chest pain.  Gastrointestinal: Negative.  Negative for nausea, vomiting, abdominal pain and diarrhea.  Genitourinary: Negative.  Negative for dysuria and vaginal discharge.  Musculoskeletal: Negative.  Negative for back pain.  Skin: Negative.  Negative for color change and rash.  Neurological: Negative.  Negative for syncope and headaches.  Hematological: Negative.  Negative for adenopathy.  Psychiatric/Behavioral: Positive for confusion.  All other systems reviewed and are negative.    Allergies  Codeine  Home Medications   Current Outpatient Rx  Name Route Sig Dispense Refill  . ALISKIREN FUMARATE 300 MG PO TABS Oral Take 300 mg by mouth daily.      . ALLOPURINOL 300 MG PO TABS Oral Take 150 mg by mouth daily.      . CHLORDIAZEPOXIDE HCL 10 MG PO CAPS Oral Take 10 mg by mouth 2 (two) times daily as needed.  For anxiety     . CITALOPRAM HYDROBROMIDE 10 MG PO TABS Oral Take 10 mg by mouth daily.      Marland Kitchen CLOPIDOGREL BISULFATE 75 MG PO TABS Oral Take 75 mg by mouth daily.      Marland Kitchen FAMOTIDINE 40 MG PO TABS Oral Take 40 mg by mouth at bedtime as needed. For heartburn/reflux.     . FUROSEMIDE 20 MG PO TABS Oral Take 20 mg by mouth daily.      . ISOSORBIDE MONONITRATE ER 30 MG PO TB24 Oral Take 30 mg by mouth daily.      Marland Kitchen LEVOTHYROXINE SODIUM 100 MCG PO TABS Oral Take 100 mcg by mouth every morning. ON AN EMPTY STOMACH     . POLYSACCHARIDE IRON 150 MG PO CAPS Oral Take 150 mg by mouth 2 (two) times daily.      .  QUINAPRIL HCL 40 MG PO TABS Oral Take 80 mg by mouth daily.      Marland Kitchen SIMVASTATIN 40 MG PO TABS Oral Take 40 mg by mouth daily.        BP 130/88  Pulse 63  Temp(Src) 98.1 F (36.7 C) (Oral)  Resp 16  Ht 5' (1.524 m)  Wt 140 lb (63.504 kg)  BMI 27.34 kg/m2  SpO2 99%  Physical Exam  Constitutional: She appears well-developed and well-nourished.  Non-toxic appearance. She does not have a sickly appearance.  HENT:  Head: Normocephalic and atraumatic.  Eyes: Conjunctivae, EOM and lids are normal. Pupils are equal, round, and reactive to light. No scleral icterus.  Neck: Trachea normal and normal range of motion. Neck supple.  Cardiovascular: Normal heart sounds.  An irregular rhythm present. Tachycardia present.   Pulmonary/Chest: Effort normal and breath sounds normal. No respiratory distress. She has no wheezes. She has no rales.  Abdominal: Soft. Normal appearance. There is no tenderness. There is no rebound, no guarding and no CVA tenderness.  Musculoskeletal: Normal range of motion.  Neurological: She is alert. She has normal strength.  Skin: Skin is warm, dry and intact. No rash noted.  Psychiatric: She has a normal mood and affect. Her behavior is normal.    ED Course  Procedures (including critical care time)  Results for orders placed during the hospital encounter of 03/18/11  CBC      Component Value Range   WBC 6.0  4.0 - 10.5 (K/uL)   RBC 4.35  3.87 - 5.11 (MIL/uL)   Hemoglobin 12.4  12.0 - 15.0 (g/dL)   HCT 21.3  08.6 - 57.8 (%)   MCV 86.2  78.0 - 100.0 (fL)   MCH 28.5  26.0 - 34.0 (pg)   MCHC 33.1  30.0 - 36.0 (g/dL)   RDW 46.9  62.9 - 52.8 (%)   Platelets 193  150 - 400 (K/uL)  BASIC METABOLIC PANEL      Component Value Range   Sodium 140  135 - 145 (mEq/L)   Potassium 3.3 (*) 3.5 - 5.1 (mEq/L)   Chloride 99  96 - 112 (mEq/L)   CO2 29  19 - 32 (mEq/L)   Glucose, Bld 88  70 - 99 (mg/dL)   BUN 20  6 - 23 (mg/dL)   Creatinine, Ser 4.13  0.50 - 1.10 (mg/dL)    Calcium 24.4  8.4 - 10.5 (mg/dL)   GFR calc non Af Amer 47 (*) >90 (mL/min)   GFR calc Af Amer 54 (*) >90 (mL/min)  PROTIME-INR      Component Value Range  Prothrombin Time 13.7  11.6 - 15.2 (seconds)   INR 1.03  0.00 - 1.49   APTT      Component Value Range   aPTT 31  24 - 37 (seconds)  TROPONIN I      Component Value Range   Troponin I <0.30  <0.30 (ng/mL)   Dg Chest Portable 1 View  03/18/2011  *RADIOLOGY REPORT*  Clinical Data: Chest pain, weakness  PORTABLE CHEST - 1 VIEW  Comparison: Portable exam 1525 hours compared to 02/09/2010  Findings: Borderline enlargement of cardiac silhouette. Minimal pulmonary vascular congestion. Calcified tortuous aorta. Chronic peribronchial thickening and question underlying emphysematous changes. No definite acute infiltrate, pleural effusion or pneumothorax. Minimal subsegmental atelectasis at left base. Bones diffusely demineralized.  IMPRESSION: Borderline enlargement of cardiac silhouette. Emphysematous and bronchitic changes with minimal left basilar atelectasis.  Original Report Authenticated By: Lollie Marrow, M.D.      Date: 03/18/2011  Rate: 123  Rhythm: atrial fibrillation  QRS Axis: normal  Intervals: normal  ST/T Wave abnormalities: normal  Conduction Disutrbances:none  Narrative Interpretation:   Old EKG Reviewed: changes noted from 02/10/2010 when the patient was in a normal sinus rhythm    MDM  Patient with atrial fibrillation with RVR here.  She did have some associated chest pain shortness of breath but her initial troponin is negative and her EKG has no acute ischemic changes.  I will start the patient on a diltiazem drip and called her cardiologist for admission of this patient.  She is otherwise stable at this point in time.   Discussed pt with Dr. Anselm Jungling who will see and admit the pt.  Will give 240 mg PO cardizem per his request for rate control.         Nat Christen, MD 03/18/11 [phone]

## 2011-03-18 NOTE — Progress Notes (Signed)
PHARMACIST - PHYSICIAN COMMUNICATION DR:   Thedore Mins CONCERNING:  Zocor and Cardizem interaction  RECOMMENDATION: Per P&T we subbed Crestor 10mg  for Zocor 40mg  due to drug interaction.  Please consider this at discharge.  Thanks!!

## 2011-03-18 NOTE — ED Notes (Signed)
Called lab to check delay on troponin. Informed test was still being ran and results would be available in approx 15 minutes. EDP notified.

## 2011-03-18 NOTE — ED Notes (Signed)
Pt's son reports that pt is here for chest pain. Pt has a history of Afib and is being followed by Dr. Nadara Eaton. Pt states that she started experiencing chest pain this am and had some shortness of breath. Pt has not exhibited any SOB during triage. Pt does report chest pain/pressure 6/10. Pt does have anxiety and has had anxiety related chest pain in the past. Pt was told that she needs a pacemaker, but pt has not wanted one. Pt is a full code.

## 2011-03-18 NOTE — ED Notes (Signed)
EKG performed by Berniece Pap, NT in Triage

## 2011-03-18 NOTE — H&P (Addendum)
[name] [id number]  Outpatient Primary MD for the patient is Melvia Heaps, MD, MD, [facility]  With History of -  Past Medical History  Diagnosis Date  . Alzheimer disease prolapsed bladder  . Atrial fibrillation   . Anxiety   . Hypertension   . COPD (chronic obstructive pulmonary disease)   . Hx: UTI (urinary tract infection)   . Chronic kidney disease, stage III (moderate)   . GERD (gastroesophageal reflux disease)   . Dyslipidemia   . Hypothyroid   . CAD (coronary artery disease), native coronary artery     H/O PTCA, balloon angioplasty Mid LAD in 2001, 4.0x18 vision in proximal RCA 3.2010 and cardiac cath 01/2009 revealed patent PCI sites  . H/O: GI bleed     Massive rectal bleed 2008. Not a coumadin candidate. A. Fib without recurrence.        Past Surgical History  Procedure Date  . Coronary angioplasty with stent placement bladder tacking  . Myocardial infarct   . Total vaginal hysterectomy     in for   Chief Complaint  Patient presents with  . Chest Pain  . Shortness of Breath     HPI [name]  is a 77 y.o. female,  With history of coronary artery disease status post stenting few years ago, who is a patient of Dr. Jacinto Halim, patient also suffers with Alzheimer's dementia, currently lives all by herself at her house, has had 3 or 4 mechanical falls over the past few months. Patient now comes in with 24-36 hour history of some palpitations and substernal chest tightness which she sees at times feel like chest pain, she says that this discomfort has been there for about 24-36 hours it is nonradiating no aggravating or relieving factors it not is not associated with any shortness of breath, nausea, dizziness.   In the ER patient is now pain-free, she was found to be in atrial fibrillation with RVR, she was seen by Dr. Jacinto Halim. patient's primary cardiologist who has placed her on a Cardizem drip and requested me to admit the patient.    Review of Systems   Now all -ve  In addition to the HPI above,   No Fever-chills, No Headache, No changes with Vision or hearing, No problems swallowing food or Liquids, No Chest pain, Cough or Shortness of Breath, No Abdominal pain, No Nausea or Vommitting, Bowel movements are regular, No Blood in stool or Urine, No dysuria, No new skin rashes or bruises, No new joints pains-aches,  No new weakness, tingling, numbness in any extremity, No recent weight gain or loss, No polyuria, polydypsia or polyphagia, No significant Mental Stressors.  A full 10 point Review of Systems was done, except as stated above, all other Review of Systems were negative.   Social History History  Substance Use Topics  . Smoking status: Former Smoker    Quit date: 03/18/1959  . Smokeless tobacco: Not on file  . Alcohol Use: 0.6 oz/week    1 Glasses of wine per week      Family History CAD in both parents  Prior to Admission medications   Medication Sig Start Date End Date Taking? Authorizing Provider  aliskiren (TEKTURNA) 300 MG tablet Take 300 mg by mouth daily.     Yes Historical Provider, MD  allopurinol (ZYLOPRIM) 300 MG tablet Take 150 mg by mouth daily.     Yes Historical Provider, MD  chlordiazePOXIDE (LIBRIUM) 10 MG capsule Take 10 mg by mouth 2 (two) times daily as  needed. For anxiety    Yes Historical Provider, MD  citalopram (CELEXA) 10 MG tablet Take 10 mg by mouth daily.     Yes Historical Provider, MD  clopidogrel (PLAVIX) 75 MG tablet Take 75 mg by mouth daily.     Yes Historical Provider, MD  famotidine (PEPCID) 40 MG tablet Take 40 mg by mouth at bedtime as needed. For heartburn/reflux.    Yes Historical Provider, MD  furosemide (LASIX) 20 MG tablet Take 20 mg by mouth daily.     Yes Historical Provider, MD  isosorbide mononitrate (IMDUR) 30 MG 24 hr tablet Take 30 mg by mouth daily.     Yes Historical Provider, MD  levothyroxine (SYNTHROID, LEVOTHROID) 100 MCG tablet Take 100 mcg by mouth every  morning. ON AN EMPTY STOMACH    Yes Historical Provider, MD  polysaccharide iron (NIFEREX) 150 MG CAPS capsule Take 150 mg by mouth 2 (two) times daily.     Yes Historical Provider, MD  quinapril (ACCUPRIL) 40 MG tablet Take 80 mg by mouth daily.     Yes Historical Provider, MD  simvastatin (ZOCOR) 40 MG tablet Take 40 mg by mouth daily.     Yes Historical Provider, MD    Allergies  Allergen Reactions  . Codeine Nausea And Vomiting    Physical Exam No intake or output data in the 24 hours ending 03/18/11 1941 Blood pressure 118/96, pulse 120, temperature 98.1 F (36.7 C), temperature source Oral, resp. rate 19, height 5' (1.524 m), weight 63.504 kg (140 lb), SpO2 95.00%.  1. General elderly frail female lying in bed in NAD,     2. Normal affect and insight, Not Suicidal or Homicidal, Awake Alert, Oriented *3.  3. No F.N deficits, ALL C.Nerves Intact, Strength 5/5 all 4 extremities, Sensation intact all 4 extremities, Plantars down going.  4. Ears and Eyes appear Normal, Conjunctivae clear, PERRLA. Moist Oral Mucosa.  5. Supple Neck, No JVD, No cervical lymphadenopathy appriciated, No Carotid Bruits.  6. Symmetrical Chest wall movement, Good air movement bilaterally, CTAB.  7. iRRR, No Gallops, Rubs or Murmurs, No Parasternal Heave.  8. Positive Bowel Sounds, Abdomen Soft, Non tender, No organomegaly appriciated,       No rebound -guarding or rigidity.  9.  No Cyanosis, Normal Skin Turgor, No Skin Rash or Bruise.  10. Good muscle tone,  joints appear normal , no effusions, Normal ROM.  11. No Palpable Lymph Nodes in Neck or Axillae     Data Review  CBC  Lab 03/18/11 1430  WBC 6.0  HGB 12.4  HCT 37.5  PLT 193  MCV 86.2  MCH 28.5  MCHC 33.1  RDW 12.8  LYMPHSABS --  MONOABS --  EOSABS --  BASOSABS --  BANDABS --   ------------------------------------------------------------------------------------------------------------------ Chemistries   Lab 03/18/11 1430   NA 140  K 3.3*  CL 99  CO2 29  GLUCOSE 88  BUN 20  CREATININE 1.07  CALCIUM 10.1  MG --  AST --  ALT --  ALKPHOS --  BILITOT --   ------------------------------------------------------------------------------------------------------------------ estimated creatinine clearance is 33.1 ml/min (by C-G formula based on Cr of 1.07). ------------------------------------------------------------------------------------------------------------------ No results found for this basename: TSH,T4TOTAL,FREET3,T3FREE,THYROIDAB in the last 72 hours  Coagulation profile  Lab 03/18/11 1430  INR 1.03  PROTIME --   ------------------------------------------------------------------------------------------------------------------- No results found for this basename: DDIMER:2 in the last 72 hours ------------------------------------------------------------------------------------------------------------------- Cardiac Enzymes  Lab 03/18/11 1430  CKMB --  TROPONINI <0.30  MYOGLOBIN --   ------------------------------------------------------------------------------------------------------------------ No  components found with this basename: POCBNP:3 ------------------------------------------------------------------------------------------------------------------  Imaging results:   Dg Chest Portable 1 View  03/18/2011  *RADIOLOGY REPORT*  Clinical Data: Chest pain, weakness  PORTABLE CHEST - 1 VIEW  Comparison: Portable exam 1525 hours compared to 02/09/2010  Findings: Borderline enlargement of cardiac silhouette. Minimal pulmonary vascular congestion. Calcified tortuous aorta. Chronic peribronchial thickening and question underlying emphysematous changes. No definite acute infiltrate, pleural effusion or pneumothorax. Minimal subsegmental atelectasis at left base. Bones diffusely demineralized.  IMPRESSION: Borderline enlargement of cardiac silhouette. Emphysematous and bronchitic changes with  minimal left basilar atelectasis.  Original Report Authenticated By: Lollie Marrow, M.D.    My personal review of EKG: Rhythm Afib, Rate 123 /min, QTc 481, no Acute ST changes   Assessment & Plan  #1. Chest discomfort i.e. initial palpitation and some chest pressure secondary to atrial fibrillation with RVR. Troponin is negative patient has been seen by patient's cardiologist in the ER, she has been placed on oral and IV Cardizem with good results, she is now symptom-free, I have discussed the plan with patient's cardiologist Dr.Ganji, plan is to admit her to step down, place her on IV and oral Cardizem, check TSH and echogram, continue her Plavix and statin, will not check enzymes as Dr. Jacinto Halim says it will not affect the outcome i.e. family not wishing to undergo any further invasive procedures, I concur. Will apply 1 time half inch nitro paste. Tele.  Note per cardiology patient is on IV heparin, this is in case she needs to be cardioverted in the morning. For now I am not putting her on aspirin as patient is already on Plavix and currently on heparin drip.  #2. History of CAD (H/O PTCA, balloon angioplasty Mid LAD in 2001, 4.0x18 vision in proximal RCA 3.2010 and cardiac cath 01/2009 revealed patent PCI sites )  status post stent in the past - will monitor on telemetry medications as dictated above will be continued, plan of care is to continue medical treatment only no invasive procedures. Next  #3. Hypokalemia - potassium has been replaced will be checked again in the morning the, note patient on Lasix.  #4. History of hypertension. No acute issues we'll continue home medications with the exception of externa which I am holding has patient also appears to be on Accupril I have requested pharmacy to reconsult her medications and clarify if she is on both of these medically istory of hypertension he she will be continued on her home medications.   #5. History of Alzheimer's dementia with 3-4  mechanical falls recently, home medications to be continued outpatient followup, PT consult, consider placement, patient not a Coumadin candidate.  #6. History of hypothyroidism home dose Synthroid will be continued we'll check TSH.  #7. History of anxiety will continue long acting benzodiazepine no acute issues. Next  #8. History of COPD no acute issues no wheezes oxygenation above 95% on room air when necessary nebulizers and oxygen as needed.  #9. Past history of rectal bleed secondary to Coumadin. No acute issues continue PPI patient again not a Coumadin candidate.  Pharmacy consult for home medication reconsiliation in the morning again.  DVT Prophylaxis Heparin   AM Labs Ordered, also please review Full Orders Admission, patients condition and plan of care including tests being ordered have been discussed with the patient and daughter who indicate understanding and agree with the plan and Code Status.  Code Status Full  Condition [name] M.D 03/18/2011, 7:41 PM  [facility]  Office  [phone]

## 2011-03-18 NOTE — Progress Notes (Signed)
ANTICOAGULATION CONSULT NOTE - Initial Consult  Pharmacy Consult for Heparin Indication: atrial fibrillation  Allergies  Allergen Reactions  . Codeine Nausea And Vomiting    Patient Measurements: Height: 5' (152.4 cm) Weight: 140 lb (63.504 kg) IBW/kg (Calculated) : 45.5    Vital Signs: Temp: 98.1 F (36.7 C) (12/13 1246) Temp src: Oral (12/13 1246) BP: 118/96 mmHg (12/13 1730) Pulse Rate: 120  (12/13 1653)  Labs:  Basename 03/18/11 1430  HGB 12.4  HCT 37.5  PLT 193  APTT 31  LABPROT 13.7  INR 1.03  HEPARINUNFRC --  CREATININE 1.07  CKTOTAL --  CKMB --  TROPONINI <0.30   Estimated Creatinine Clearance: 33.1 ml/min (by C-G formula based on Cr of 1.07).  Medical History: Past Medical History  Diagnosis Date  . Alzheimer disease prolapsed bladder  . Atrial fibrillation   . Anxiety   . Hypertension   . COPD (chronic obstructive pulmonary disease)   . Hx: UTI (urinary tract infection)   . Chronic kidney disease, stage III (moderate)   . GERD (gastroesophageal reflux disease)   . Dyslipidemia   . Hypothyroid   . CAD (coronary artery disease), native coronary artery     H/O PTCA, balloon angioplasty Mid LAD in 2001, 4.0x18 vision in proximal RCA 3.2010 and cardiac cath 01/2009 revealed patent PCI sites  . H/O: GI bleed     Massive rectal bleed 2008. Not a coumadin candidate. A. Fib without recurrence.      Medications:  Scheduled:    . diltiazem  240 mg Oral Once  . diltiazem  10 mg Intravenous Once  . diltiazem (CARDIZEM) infusion  5-15 mg/hr Intravenous Once  . nitroGLYCERIN  0.5 inch Topical Once  . potassium chloride  40 mEq Oral Once  . sodium chloride  500 mL Intravenous Once  . DISCONTD: diltiazem  10 mg Intravenous Once  . DISCONTD: diltiazem  240 mg Oral Once   Infusions:    Assessment: 77 yo female in a-fib MD ordering IV Heparin x 24 hrs for possible cardioversion if patient is not rate controlled or converts to sinus rhythm. Goal of  Therapy:  Heparin level 0.3-0.7 units/ml   Plan:  Heparin 2500 unit bolus IV x1. Start Heparin drip @ 750 unit/hr. Daily CBC/Heparin level. Check 1st heparin level in am.  [name] 03/18/2011,8:09 PM

## 2011-03-19 LAB — BASIC METABOLIC PANEL
BUN: 20 mg/dL (ref 6–23)
Creatinine, Ser: 1.03 mg/dL (ref 0.50–1.10)
GFR calc Af Amer: 57 mL/min — ABNORMAL LOW (ref >90)
GFR calc non Af Amer: 49 mL/min — ABNORMAL LOW (ref >90)
Glucose, Bld: 95 mg/dL (ref 70–99)
Potassium: 4.1 mEq/L (ref 3.5–5.1)

## 2011-03-19 LAB — CBC
HCT: 32.5 % — ABNORMAL LOW (ref 36.0–46.0)
Hemoglobin: 10.8 g/dL — ABNORMAL LOW (ref 12.0–15.0)
MCH: 29 pg (ref 26.0–34.0)
MCHC: 33.2 g/dL (ref 30.0–36.0)
MCV: 87.4 fL (ref 78.0–100.0)
RDW: 13 % (ref 11.5–15.5)

## 2011-03-19 MED ORDER — DILTIAZEM HCL 60 MG PO TABS
120.0000 mg | ORAL_TABLET | Freq: Two times a day (BID) | ORAL | Status: DC
  Filled 2011-03-19 (×2): qty 2

## 2011-03-19 MED ORDER — SODIUM CHLORIDE 0.9 % IV BOLUS (SEPSIS)
500.0000 mL | Freq: Once | INTRAVENOUS | Status: AC
  Administered 2011-03-19: 500 mL via INTRAVENOUS

## 2011-03-19 MED ORDER — LISINOPRIL 40 MG PO TABS
80.0000 mg | ORAL_TABLET | Freq: Every day | ORAL | Status: DC
  Administered 2011-03-19 – 2011-03-21 (×3): 80 mg via ORAL
  Filled 2011-03-19 (×4): qty 2

## 2011-03-19 MED ORDER — DILTIAZEM HCL ER COATED BEADS 120 MG PO CP24
120.0000 mg | ORAL_CAPSULE | Freq: Two times a day (BID) | ORAL | Status: DC
  Administered 2011-03-19 – 2011-03-21 (×5): 120 mg via ORAL
  Filled 2011-03-19 (×9): qty 1

## 2011-03-19 MED ORDER — ENOXAPARIN SODIUM 40 MG/0.4ML ~~LOC~~ SOLN
40.0000 mg | SUBCUTANEOUS | Status: DC
  Administered 2011-03-20: 40 mg via SUBCUTANEOUS
  Filled 2011-03-19 (×3): qty 0.4

## 2011-03-19 NOTE — ED Notes (Signed)
Report given to Topher RN

## 2011-03-19 NOTE — ED Notes (Signed)
Nitro paste removed per Dr. Thedore Mins orders

## 2011-03-19 NOTE — Consult Note (Signed)
Subjective: I saw this patient in followup this morning for consultation medical management the patient is sitting in a chair at the bedside and states that she feels well enough to be discharged home. The patient has no complaints at this time. All of her chronic conditions appear to be quiescent and stable.  Objective: General: l the patient is well-appearing in no acute distress. Vital signs: Temperature 97.4, heart rate 65, blood pressure 112/57, respiratory rate 16, O2 sats 90% room air. HEENT: Normocephalic atraumatic pupils equally round and reactive to light and accommodation, extraocular movements are intact, oropharynx moist no exudate erythema lesions noted. Lungs: Lungs are clear to auscultation no wheezing rhonchi noted. No increased vocal fremitus. Resonant to percussion. Patient has no accessory muscle use. Equal excursion bilaterally. Cardiovascular: Patient has a normal S1 and S2 no murmurs rubs or gallops. Irregularly irregular heartbeat with a heart rate of about 80. No heaves or thrills on palpation PMI nondisplaced. Abdomen: Abdomen is obese soft nontender nondistended no masses no hepatosplenomegaly.  Extremities: Extremities show no clubbing cyanosis or edema.  Laboratory data: Hemoglobin 10.8, hematocrit 32.5 otherwise laboratories studies normal  Assessment: #1 hypothyroidism: Recommend continue Synthroid at present dose and check TSH to ensure that this is not contributing to the patient's atrial fibrillation with rapid ventricular response. #2 hypertension: Will defer to Dr. Newt Lukes in the context of medications being used to control atrial fibrillation. #3 anemia: We'll continue on iron supplementation. #4 hyperlipidemia: Continue simvastatin. I discussed the patient's case with Dr. Jacinto Halim noting that the patient's heart rate is now in good control. This patient can likely be transitioned to oral medications and sent to telemetry bed and simvastatin  unit.  [name] A. [phone]

## 2011-03-19 NOTE — Progress Notes (Signed)
ANTICOAGULATION CONSULT NOTE - Follow Up Consult  Pharmacy Consult for Heparin Indication: atrial fibrillation  Allergies  Allergen Reactions  . Codeine Nausea And Vomiting    Patient Measurements: Height: 5' (152.4 cm) Weight: 132 lb 3.2 oz (59.966 kg) IBW/kg (Calculated) : 45.5   Vital Signs: Temp: 97.6 F (36.4 C) (12/14 1334) Temp src: Oral (12/14 1334) BP: 106/68 mmHg (12/14 1334) Pulse Rate: 82  (12/14 1334)  Labs:  Basename 03/19/11 1258 03/19/11 0550 03/18/11 2230 03/18/11 1430  HGB -- 10.8* 10.9* --  HCT -- 32.5* 33.2* 37.5  PLT -- 156 193 193  APTT -- -- -- 31  LABPROT -- -- 15.7* 13.7  INR -- -- 1.22 1.03  HEPARINUNFRC 0.43 0.52 -- --  CREATININE -- 1.03 1.14* 1.07  CKTOTAL -- -- -- --  CKMB -- -- -- --  TROPONINI -- -- -- <0.30   Estimated Creatinine Clearance: 33.5 ml/min (by C-G formula based on Cr of 1.03).   Medications:  Scheduled:    . allopurinol  150 mg Oral Daily  . citalopram  10 mg Oral Daily  . clopidogrel  75 mg Oral Daily  . diltiazem  120 mg Oral BID  . diltiazem  240 mg Oral Once  . diltiazem  10 mg Intravenous Once  . diltiazem (CARDIZEM) infusion  5-15 mg/hr Intravenous Once  . famotidine  40 mg Oral Daily  . furosemide  20 mg Oral Daily  . heparin  2,500 Units Intravenous Once  . isosorbide mononitrate  30 mg Oral Daily  . levothyroxine  100 mcg Oral Q0700  . lisinopril  80 mg Oral Daily  . nitroGLYCERIN  0.5 inch Topical Once  . polysaccharide iron  150 mg Oral BID  . potassium chloride  40 mEq Oral Once  . rosuvastatin  10 mg Oral q1800  . sodium chloride  250 mL Intravenous Once  . sodium chloride  500 mL Intravenous Once  . sodium chloride  500 mL Intravenous Once  . DISCONTD: diltiazem  120 mg Oral Daily  . DISCONTD: diltiazem (CARDIZEM) infusion  5-15 mg/hr Intravenous Once  . DISCONTD: diltiazem  10 mg Intravenous Once  . DISCONTD: diltiazem  120 mg Oral Q12H  . DISCONTD: diltiazem  240 mg Oral Once  . DISCONTD:  heparin  5,000 Units Subcutaneous Q8H  . DISCONTD: quinapril  80 mg Oral Daily  . DISCONTD: simvastatin  40 mg Oral Daily   Infusions:    . heparin 750 Units/hr (03/18/11 2050)   PRN: albuterol, chlordiazePOXIDE, guaiFENesin-dextromethorphan, HYDROcodone-acetaminophen, ondansetron (ZOFRAN) IV, ondansetron  Assessment: 77 yo F on IV heparin for afib. Repeat heparin level remains therapeutic.   MD has written in the comments of the consult " Heparin for 24 hours for possible cardioversion if A.Fib is not rate controlled. D/C and change to VTE if rate controlled or converts to sinus rhythm." **This decision is outside the scope of practice of a PharmD --> decision as to when to stop the heparin must be made by an MD**  Heparin bolus given at 12/13 at 20:49, and is now running at 750 units/hr (7.5 ml/hr).   Goal of Therapy:  Heparin level 0.3-0.7 units/ml   Plan:  1)  Continue heparin at 750 units/hr. F/U hep level in am. 2)  MD to make decision re: when to stop heparin infusion.  [name] 03/19/2011,2:31 PM

## 2011-03-19 NOTE — Progress Notes (Signed)
Heparin level of 0.52 at goal.  Per RN no issues with drip. Will continue with current rate and recheck level at [address] [facility] Darlina Guys PharmD, BCPS  03/19/2011, 6:37 AM

## 2011-03-19 NOTE — Progress Notes (Signed)
  Echocardiogram 2D Echocardiogram has been performed.  Jorje Guild [facility] 03/19/2011, 11:09 AM

## 2011-03-19 NOTE — Progress Notes (Addendum)
Subjective:  Feels well and states she is ready to go home. Denies chest pain or dyspnea.   Objective:  Vital Signs in the last 24 hours: Temp:  [97.4 F (36.3 C)-97.8 F (36.6 C)] 97.8 F (36.6 C) (12/14 2105) Pulse Rate:  [39-136] 73  (12/14 2105) Resp:  [16-18] 17  (12/14 2105) BP: (88-140)/(43-101) 118/55 mmHg (12/14 2105) SpO2:  [88 %-98 %] 96 % (12/14 2105) Weight:  [59.966 kg (132 lb 3.2 oz)] 132 lb 3.2 oz (59.966 kg) (12/14 1334)  Intake/Output from previous day:    Physical Exam:   General appearance: alert, cooperative and appears stated age2 Eyes: conjunctivae/corneas clear. PERRL, EOM's intact. Fundi benign. Neck: no adenopathy, no carotid bruit, no JVD, supple, symmetrical, trachea midline and thyroid not enlarged, symmetric, no tenderness/mass/nodules Neck: JVP - normal, carotids 2+= without bruits Resp: clear to auscultation bilaterally Chest wall: no tenderness Cardio: irregularly irregular rhythm and no S3 or S4 GI: soft, non-tender; bowel sounds normal; no masses,  no organomegaly Extremities: extremities normal, atraumatic, no cyanosis or edema    Lab Results:  Basename 03/19/11 0550 03/18/11 2230  WBC 5.0 6.3  HGB 10.8* 10.9*  PLT 156 193    Basename 03/19/11 0550 03/18/11 2230 03/18/11 1430  NA 139 -- 140  K 4.1 -- 3.3*  CL 104 -- 99  CO2 28 -- 29  GLUCOSE 95 -- 88  BUN 20 -- 20  CREATININE 1.03 1.14* --     Cardiac Studies: Tele: A. Fibrillation rate controlled.  Assessment/Plan:  1.  A. Fibrillation with RVR, now rate better controlled. H/O A. Fibrillation in the past not felt to be a candidate for coumadin. Presently in sinus. Had severe rectal bleed in the past.  EKG: A. Fibrillation with RVR, non specific T change.  2. CAD/ASHD stable on appropriate medications. H/O PTCA, balloon angioplasty Mid LAD in 2001, 4.0x18 vision in proximal RCA 3.2010 and cardiac cath 01/2009 revealed patent PCI sites.  3. H/O GIB in the past. Rectal Bleed  in 2008. Spontaneous and not felt to be a coumadin candidate.  4. Carotid bruit. 03/09/10: carotid Mild disease. Tortuosity.  5. Hyperlipidemia: labs from pcp 08/2010 TSH is elevated 8.31 CBC normal, s. cr1.3, TC 164, TG 97, HDL 79, LDL 59  6. Recurrent falls recently.  7. Dementia.  8. Chest pain appears to be atypical. Also family leaning towards conservative therapy. Unless recurrent chest pain, no further evaluation at this time or if EKG shows significant ischemic change, then further evalution.   Rec: I will stop IV heparin due to the fact she is not a candidate for long-term anticoagulaiton. She is feeling better and is essentially asymptomatic and hence rate control strategy with cardizem is indicated. Continue at 120 mg PO bid for now and OV in 2 weeks. Please call if questions.  Patient seen at 645 pm today   Pamella Pert, M.D. 03/19/2011, 11:07 PM

## 2011-03-20 LAB — CBC
Hemoglobin: 9.8 g/dL — ABNORMAL LOW (ref 12.0–15.0)
MCV: 86.9 fL (ref 78.0–100.0)
Platelets: 144 10*3/uL — ABNORMAL LOW (ref 150–400)
RBC: 3.43 MIL/uL — ABNORMAL LOW (ref 3.87–5.11)
WBC: 5.6 10*3/uL (ref 4.0–10.5)

## 2011-03-20 MED ORDER — ASPIRIN 81 MG PO CHEW: 81.0000 mg | CHEWABLE_TABLET | Freq: Every day | ORAL | Status: AC

## 2011-03-20 MED ORDER — DILTIAZEM HCL ER COATED BEADS 120 MG PO CP24: 120.0000 mg | ORAL_CAPSULE | Freq: Two times a day (BID) | ORAL | Status: DC

## 2011-03-20 MED ORDER — LEVOTHYROXINE SODIUM 75 MCG PO TABS: 75.0000 ug | ORAL_TABLET | Freq: Every day | ORAL | Status: DC

## 2011-03-20 NOTE — Discharge Summary (Signed)
[name] MRN: [id number] DOB/AGE: 75/07/1925 75 y.o.  Admit date: 03/18/2011 Discharge date: 03/20/2011  Primary Care Physician:  Melvia Heaps, MD, MD, [facility]   Discharge Diagnoses:   Patient Active Problem List  Diagnoses  . Atrial fibrillation with RVR  . Alzheimer disease  . Anxiety  . Hypertension  . COPD (chronic obstructive pulmonary disease)  . Chronic kidney disease, stage III (moderate)  . GERD (gastroesophageal reflux disease)  . Dyslipidemia  . Hypothyroid  . Hypokalemia  . CAD (coronary artery disease)  . Stented coronary artery    DISCHARGE MEDICATION: Current Discharge Medication List    START taking these medications   Details  diltiazem (CARDIZEM CD) 120 MG 24 hr capsule Take 1 capsule (120 mg total) by mouth 2 (two) times daily. Qty: 60 capsule, Refills: 0      CONTINUE these medications which have CHANGED   Details  levothyroxine (SYNTHROID) 75 MCG tablet Take 1 tablet (75 mcg total) by mouth daily. Qty: 30 tablet, Refills: 0      CONTINUE these medications which have NOT CHANGED   Details  aliskiren (TEKTURNA) 300 MG tablet Take 300 mg by mouth daily.      allopurinol (ZYLOPRIM) 300 MG tablet Take 150 mg by mouth daily.      chlordiazePOXIDE (LIBRIUM) 10 MG capsule Take 10 mg by mouth 2 (two) times daily as needed. For anxiety     citalopram (CELEXA) 10 MG tablet Take 10 mg by mouth daily.      clopidogrel (PLAVIX) 75 MG tablet Take 75 mg by mouth daily.      famotidine (PEPCID) 40 MG tablet Take 40 mg by mouth at bedtime as needed. For heartburn/reflux.     furosemide (LASIX) 20 MG tablet Take 20 mg by mouth daily.      isosorbide mononitrate (IMDUR) 30 MG 24 hr tablet Take 30 mg by mouth daily.      polysaccharide iron (NIFEREX) 150 MG CAPS capsule Take 150 mg by mouth 2 (two) times daily.      quinapril (ACCUPRIL) 40 MG tablet Take 80 mg by mouth daily.      simvastatin (ZOCOR) 40 MG tablet Take 40 mg by mouth daily.              Consults:  Dr. Jacinto Halim   SIGNIFICANT DIAGNOSTIC STUDIES:  Dg Chest Portable 1 View  03/18/2011  *RADIOLOGY REPORT*  Clinical Data: Chest pain, weakness  PORTABLE CHEST - 1 VIEW  Comparison: Portable exam 1525 hours compared to 02/09/2010  Findings: Borderline enlargement of cardiac silhouette. Minimal pulmonary vascular congestion. Calcified tortuous aorta. Chronic peribronchial thickening and question underlying emphysematous changes. No definite acute infiltrate, pleural effusion or pneumothorax. Minimal subsegmental atelectasis at left base. Bones diffusely demineralized.  IMPRESSION: Borderline enlargement of cardiac silhouette. Emphysematous and bronchitic changes with minimal left basilar atelectasis.  Original Report Authenticated By: Lollie Marrow, M.D.     ECHO:   - Left ventricle: The cavity size was normal. There was mild concentric hypertrophy. Systolic function was normal. The estimated ejection fraction was in the range of 50% to 55%. The study is not technically sufficient to allow evaluation of LV diastolic function. - Mitral valve: Calcified annulus. Moderate regurgitation. - Left atrium: The atrium was moderately dilated. - Atrial septum: No defect or patent foramen ovale was identified. - Tricuspid valve: Mild-moderate regurgitation. - Pulmonary arteries: Systolic pressure was mildly to moderately increased. PA peak pressure: 36mm Hg (S).   BRIEF ADMITTING H & P:  [name] is a 77 y.o. female, With history of coronary artery disease status post stenting few years ago, who is a patient of Dr. Jacinto Halim, patient also suffers with Alzheimer's dementia, currently lives all by herself at her house, has had 3 or 4 mechanical falls over the past few months. Patient now comes in with 24-36 hour history of some palpitations and substernal chest tightness which she sees at times feel like chest pain, she says that this discomfort has been there for about 24-36 hours it is  nonradiating no aggravating or relieving factors it not is not associated with any shortness of breath, nausea, dizziness.  In the ER patient is now pain-free, she was found to be in atrial fibrillation with RVR, she was seen by Dr. Jacinto Halim. patient's primary cardiologist who has placed her on a Cardizem drip and requested me to admit the patient.      Hospital Course:  Present on Admission:  .Atrial fibrillation with RVR: The patient is admitted to fibrillation with rapid ventricular response. She was started on IV Cardizem and then transitioned over to by mouth Cardizem. Her heart rate is well-controlled and she was seen in consultation cardiologist Dr. Jacinto Halim recommended the patient be on 120 mg of Cardizem and a twice a day basis and ASA 325 mg daily. Additionally this patient does have hypothyroidism and is on thyroid supplementation. TSH was checked and was found to be low. Her dose of Synthroid is been reduced to 75 mcg daily from 100 mcg. I feel that this is likely contributing factor to her tachyarrhythmia. I would reassess the patient's TSH in about 6 weeks to assess for further appropriate titration.  .Dyslipidemia: Continue on simvastatin.   Marland KitchenHypothyroid: The patient has a TSH of 0.126. This could certainly be due to sick euthyroid versus old supplementation. However in light of the fact that the patient had atrial fibrillation at a rapid ventricular response I have decreased her Synthroid to 75 mcg and advise a recheck her TSH in about 4-6 weeks for further appropriate titration.   Marland KitchenGERD (gastroesophageal reflux disease): Continue Pepcid.   .Chronic kidney disease, stage III (moderate):  Kidney function stable   .COPD (chronic obstructive pulmonary disease): COPD quiescent.  .Hypertension: Blood pressure well-controlled   .Alzheimer disease: Although the patient has dementia she seems to be reasonably functional in her current state. There is mention of the fact that the patient had  had several falls however she shows no physical evidence of these falls.  However her PT evaluation is complete and there is no recommendation for skilled PT services.   .Hypokalemia: Repleted   .CAD (coronary artery disease): Stable no active signs or symptoms.  .Anxiety: Well compensated  Disposition and Follow-up: F/U with Dr. Jacinto Halim in 1 week- their office will call to set up appointment.F/U with Dr. Tiburcio Pea in 1 week. Dr. Tiburcio Pea to specifically address Thyroid dosing.   DISCHARGE EXAM:  General: l the patient is well-appearing in no acute distress.  Blood pressure 106/70, pulse 71, temperature 98.5 F (36.9 C), temperature source Oral, resp. rate 16, height 5' (1.524 m), weight 60.963 kg (134 lb 6.4 oz), SpO2 95.00%. HEENT: Normocephalic atraumatic pupils equally round and reactive to light and accommodation, extraocular movements are intact, oropharynx moist no exudate erythema lesions noted.  Lungs: Lungs are clear to auscultation no wheezing rhonchi noted. No increased vocal fremitus. Resonant to percussion. Patient has no accessory muscle use. Equal excursion bilaterally.  Cardiovascular: Patient has a normal S1 and S2 no  murmurs rubs or gallops. Irregularly irregular heartbeat with a heart rate of about 80. No heaves or thrills on palpation PMI nondisplaced.  Abdomen: Abdomen is obese soft nontender nondistended no masses no hepatosplenomegaly.  Extremities: Extremities show no clubbing cyanosis or edema.        Basename 03/19/11 0550 03/18/11 2230 03/18/11 1430  NA 139 -- 140  K 4.1 -- 3.3*  CL 104 -- 99  CO2 28 -- 29  GLUCOSE 95 -- 88  BUN 20 -- 20  CREATININE 1.03 1.14* --  CALCIUM 9.1 -- 10.1  MG -- -- --  PHOS -- -- --   No results found for this basename: AST:2,ALT:2,ALKPHOS:2,BILITOT:2,PROT:2,ALBUMIN:2 in the last 72 hours No results found for this basename: LIPASE:2,AMYLASE:2 in the last 72 hours  Basename 03/20/11 0523 03/19/11 0550  WBC 5.6 5.0  NEUTROABS  -- --  HGB 9.8* 10.8*  HCT 29.8* 32.5*  MCV 86.9 87.4  PLT 144* 156   Total time for discharge process including face to face time 45 minutes.  Signed: Dearius Hoffmann A. 03/20/2011, 8:09 AM

## 2011-03-20 NOTE — Progress Notes (Signed)
Dr Ashley Royalty notified of fall, callback with orders to not d/c patient at this time; obtain xray of knee.  Pt son notified of fall and pending xray. Son requests SW/CM consult to discuss d/c options.  Pt placed back in bed with bed alarm on.  Tele restarted.

## 2011-03-20 NOTE — Progress Notes (Signed)
Physical Therapy Evaluation Patient Details Name: [name] MRN: [id number] DOB: [birth year] Today's Date: 03/20/2011 10-10:30 EV (1x eval only) Problem List:  Patient Active Problem List  Diagnoses  . Atrial fibrillation with RVR  . Alzheimer disease  . Anxiety  . Hypertension  . COPD (chronic obstructive pulmonary disease)  . Chronic kidney disease, stage III (moderate)  . GERD (gastroesophageal reflux disease)  . Dyslipidemia  . Hypothyroid  . Hypokalemia  . CAD (coronary artery disease)  . Stented coronary artery    Past Medical History:  Past Medical History  Diagnosis Date  . Alzheimer disease prolapsed bladder  . Atrial fibrillation   . Anxiety   . Hypertension   . COPD (chronic obstructive pulmonary disease)   . Hx: UTI (urinary tract infection)   . Chronic kidney disease, stage III (moderate)   . GERD (gastroesophageal reflux disease)   . Dyslipidemia   . Hypothyroid   . CAD (coronary artery disease), native coronary artery     H/O PTCA, balloon angioplasty Mid LAD in 2001, 4.0x18 vision in proximal RCA 3.2010 and cardiac cath 01/2009 revealed patent PCI sites  . H/O: GI bleed     Massive rectal bleed 2008. Not a coumadin candidate. A. Fib without recurrence.     Past Surgical History:  Past Surgical History  Procedure Date  . Coronary angioplasty with stent placement bladder tacking  . Myocardial infarct   . Total vaginal hysterectomy     PT Assessment/Plan/Recommendation PT Assessment Clinical Impression Statement: Pt tolerate therapy eval wella nd ambulation without any LOB.  Discussed safety with exertion and ambulationwith patient.  Pt safe from mobilyt standpoint to DC home and son to assist with transition and chek on patient frequently.  PT Recommendation/Assessment: Patent does not need any further PT services No Skilled PT: Patient at baseline level of functioning PT Recommendation Follow Up Recommendations: None Equipment Recommended: None  recommended by PT PT Goals     PT Evaluation Precautions/Restrictions    Prior Functioning  Home Living Lives With: Alone Receives Help From: Family (son near by and checks on pt daily) Type of Home: House Home Layout: One level Home Access: Stairs to enter Entrance Stairs-Rails: None Entrance Stairs-Number of Steps: 1 Home Adaptive Equipment: None Additional Comments: PT very independent at home and son assists when ever.  Pt active adn performs own yardwork, etc.  Prior Function Level of Independence: Independent with basic ADLs;Independent with homemaking with ambulation;Independent with gait Able to Take Stairs?: Yes Driving: No Vocation: Retired Comments: I don't like to stay still!! I keep moving! Cognition Cognition Arousal/Alertness: Awake/alert Overall Cognitive Status: Appears within functional limits for tasks assessed Orientation Level: Oriented X4 Sensation/Coordination Sensation Light Touch: Appears Intact Coordination Gross Motor Movements are Fluid and Coordinated: Yes Extremity Assessment RLE Assessment RLE Assessment: Within Functional Limits LLE Assessment LLE Assessment: Within Functional Limits Mobility (including Balance) Bed Mobility Bed Mobility: Yes Supine to Sit: 7: Independent Sit to Supine - Right: 7: Independent Transfers Transfers: Yes Sit to Stand: 7: Independent Stand to Sit: 7: Independent Ambulation/Gait Ambulation/Gait: Yes Ambulation/Gait Assistance: 7: Independent Ambulation Distance (Feet): 250 Feet Assistive device: None Gait Pattern: Step-through pattern;Within Functional Limits (decreased base of support, however tested with stop, start a) Stairs: No  Balance Balance Assessed: Yes High Level Balance High Level Balance Activites: Sudden stops;Direction changes Exercise    End of Session PT - End of Session Equipment Utilized During Treatment: Gait belt Activity Tolerance: Patient tolerated treatment well Patient  left: in  bed Nurse Communication: Mobility status for ambulation General Behavior During Session: [facility] for tasks performed Cognition: [facility] for tasks performed  03/20/2011, 12:07 PM Marella Bile, PT Pager: [phone] 03/20/2011

## 2011-03-20 NOTE — Progress Notes (Signed)
Pt came up to nurse's station asking about son picking her up.  Says she needs to take a break to rest her knee.  Asked what happened to her knee and she replied "I fell down on one knee in my room".  Pt pulls up pant leg to reveal large hematoma on right knee.  Pt has d/c orders written, IV and tele both have been d/c.  Pt just waiting on son to come and pick her up.  Ice pack placed to right knee, chair brought to nurse's station for pt to sit there and wait for son to pick her up.  Pt w/ hx alzheimer's and is pleasantly confused at times.  Oriented to person, oriented to place with reminders.  Dr. Ashley Royalty paged - awaiting cb w/ orders.

## 2011-03-20 NOTE — Progress Notes (Signed)
The patient was waiting for a centigrade her dresser [place]. She got out of the bed without assistance despite being instructed to call for assistance. The patient got up and fell to her right knee. She states that she's had no significant pain unless a there significant pleasure pressure applied to the right kneecap. There is an area of a small hematoma on the right kneecap. The patient is fully alert and oriented x3. I have again discussed with the patient the possibility of going to the assisted living facility. In her words " I am not going to an assisted living facility. I would not mind is 19 and stated my house however it will not be my daughter ". Presently the patient is clinically stable. She has no other areas of trauma except to the right knee which at this time is assessed to be a superficial contusion with a superficial hematoma. Plan is to apply ice pack to the right knee and to the patient to perform active range of motion. I will reassess in the morning at this time the discharge was being held for extended assessment patient status post fall.

## 2011-03-21 DIAGNOSIS — W19XXXA Unspecified fall, initial encounter: Secondary | ICD-10-CM | POA: Diagnosis present

## 2011-03-21 NOTE — Progress Notes (Signed)
Subjective: Patient continues to reinforce that she will not go to an assisted living facility. She's learned x3 she has no complaints. She specifically called my attention to her right knee and notes that she has no pain associated with it even after having a fall yesterday. Her exact words are "I can push on it without any pain". The patient is also adamant that she does not want her medical information shared with her daughter today she was involved in any planning of her care. I have communicated this to the patient's son Mr. Bury states that he is well aware of it and that this is been the patient's ongoing physician regarding her daughter. Objective: Filed Vitals:   03/20/11 2140 03/21/11 0500 03/21/11 0549 03/21/11 0931  BP: 118/75  104/57 110/64  Pulse: 80  72   Temp: 98.9 F (37.2 C)  98.2 F (36.8 C)   TempSrc: Oral  Oral   Resp: 18  18   Height:      Weight:  61.1 kg (134 lb 11.2 oz)    SpO2: 90%  90%    Weight change: 1.135 kg (2 lb 8 oz)  Intake/Output Summary (Last 24 hours) at 03/21/11 1336 Last data filed at 03/21/11 1100  Gross per 24 hour  Intake      0 ml  Output    550 ml  Net   -550 ml    General: Alert, awake, oriented x3, in no acute distress.  HEENT: Miller/AT PEERL, EOMI Neck: Trachea midline,  no masses, no thyromegal,y no JVD, no carotid bruit OROPHARYNX:  Moist, No exudate/ erythema/lesions.  Heart: Regular rate and rhythm, without murmurs, rubs, gallops, PMI non-displaced, no heaves or thrills on palpation.  Lungs: Clear to auscultation, no wheezing or rhonchi noted. No increased vocal fremitus resonant to percussion  Abdomen: Soft, nontender, nondistended, positive bowel sounds, no masses no hepatosplenomegaly noted..  Neuro: No focal neurological deficits noted cranial nerves II through XII grossly intact. DTRs 2+ bilaterally upper and lower extremities. Strength 5 out of 5 in bilateral upper and lower extremities. Musculoskeletal: No warm swelling or  erythema around joints, the patient does have a contusion of the right knee from where she fell yesterday. She also has old contusions when she fell at home. She has no breakage of skin she has no warm swelling or erythema. no spinal tenderness noted. Psychiatric: Patient alert and oriented x3, good insight and cognition, good recent to remote recall. Lymph node survey: No cervical axillary or inguinal lymphadenopathy noted.     Lab Results:  Basename 03/19/11 0550 03/18/11 2230 03/18/11 1430  NA 139 -- 140  K 4.1 -- 3.3*  CL 104 -- 99  CO2 28 -- 29  GLUCOSE 95 -- 88  BUN 20 -- 20  CREATININE 1.03 1.14* --  CALCIUM 9.1 -- 10.1  MG -- -- --  PHOS -- -- --   No results found for this basename: AST:2,ALT:2,ALKPHOS:2,BILITOT:2,PROT:2,ALBUMIN:2 in the last 72 hours No results found for this basename: LIPASE:2,AMYLASE:2 in the last 72 hours  Basename 03/20/11 0523 03/19/11 0550  WBC 5.6 5.0  NEUTROABS -- --  HGB 9.8* 10.8*  HCT 29.8* 32.5*  MCV 86.9 87.4  PLT 144* 156    Basename 03/18/11 1430  CKTOTAL --  CKMB --  CKMBINDEX --  TROPONINI <0.30   No components found with this basename: POCBNP:3 No results found for this basename: DDIMER:2 in the last 72 hours No results found for this basename: HGBA1C:2 in the  last 72 hours No results found for this basename: CHOL:2,HDL:2,LDLCALC:2,TRIG:2,CHOLHDL:2,LDLDIRECT:2 in the last 72 hours  Basename 03/20/11 0523  TSH 0.094*  T4TOTAL --  T3FREE --  THYROIDAB --   No results found for this basename: VITAMINB12:2,FOLATE:2,FERRITIN:2,TIBC:2,IRON:2,RETICCTPCT:2 in the last 72 hours  Micro Results: No results found for this or any previous visit (from the past 240 hour(s)).  Studies/Results: Dg Chest Portable 1 View  03/18/2011  *RADIOLOGY REPORT*  Clinical Data: Chest pain, weakness  PORTABLE CHEST - 1 VIEW  Comparison: Portable exam 1525 hours compared to 02/09/2010  Findings: Borderline enlargement of cardiac silhouette.  Minimal pulmonary vascular congestion. Calcified tortuous aorta. Chronic peribronchial thickening and question underlying emphysematous changes. No definite acute infiltrate, pleural effusion or pneumothorax. Minimal subsegmental atelectasis at left base. Bones diffusely demineralized.  IMPRESSION: Borderline enlargement of cardiac silhouette. Emphysematous and bronchitic changes with minimal left basilar atelectasis.  Original Report Authenticated By: Lollie Marrow, M.D.    Medications: I have reviewed the patient's current medications. Scheduled Meds:   . allopurinol  150 mg Oral Daily  . citalopram  10 mg Oral Daily  . clopidogrel  75 mg Oral Daily  . diltiazem  120 mg Oral BID  . enoxaparin (LOVENOX) injection  40 mg Subcutaneous Q24H  . famotidine  40 mg Oral Daily  . furosemide  20 mg Oral Daily  . isosorbide mononitrate  30 mg Oral Daily  . levothyroxine  100 mcg Oral Q0700  . lisinopril  80 mg Oral Daily  . polysaccharide iron  150 mg Oral BID  . rosuvastatin  10 mg Oral q1800   Continuous Infusions:  PRN Meds:.albuterol, chlordiazePOXIDE, guaiFENesin-dextromethorphan, HYDROcodone-acetaminophen, ondansetron (ZOFRAN) IV, ondansetron Assessment/Plan: Patient Active Hospital Problem List: Fall: Patient had another mechanical fall yesterday prior to leaving the hospital. She was up walking around her room and states that she doesn't hurry and tripped over her shoes. The patient was kept overnight to observe for any complications from her fall. She had a small contusion to the right knee however there is no skin breakage and no evidence of infection or inflammation. Does the patient has a contusion to her right knee as well as a fall. I have discussed at length the patient about going to the assisted living facility. Patient is alert oriented x3 she is adamant that she will not go to an assisted living facility shaped by her experience with her mother. She said she would opt for going home  and having someone come into her home. As long as it was not her daughter. I communicated the patient's concern to her son Mr. Harjo and he will make the appropriate arrangements.   Atrial fibrillation with RVR: The patient is admitted to fibrillation with rapid ventricular response. She was started on IV Cardizem and then transitioned over to by mouth Cardizem. Her heart rate is well-controlled and she was seen in consultation cardiologist Dr. Jacinto Halim recommended the patient be on 120 mg of Cardizem and a twice a day basis and ASA 325 mg daily. Additionally this patient does have hypothyroidism and is on thyroid supplementation. TSH was checked and was found to be low. Her dose of Synthroid is been reduced to 75 mcg daily from 100 mcg. I feel that this is likely contributing factor to her tachyarrhythmia. I would reassess the patient's TSH in about 6 weeks to assess for further appropriate titration.   .Dyslipidemia: Continue on simvastatin.   Marland KitchenHypothyroid: The patient has a TSH of 0.126. This could certainly be due  to sick euthyroid versus old supplementation. However in light of the fact that the patient had atrial fibrillation at a rapid ventricular response I have decreased her Synthroid to 75 mcg and advise a recheck her TSH in about 4-6 weeks for further appropriate titration.   Marland KitchenGERD (gastroesophageal reflux disease): Continue Pepcid.   .Chronic kidney disease, stage III (moderate): Kidney function stable   .COPD (chronic obstructive pulmonary disease): COPD quiescent.   .Hypertension: Blood pressure well-controlled   .Alzheimer disease: Although the patient has dementia she seems to be reasonably functional in her current state. There is mention of the fact that the patient had had several falls however she shows no physical evidence of these falls. However her PT evaluation is complete and there is no recommendation for skilled PT services.  .Hypokalemia: Repleted   .CAD (coronary artery  disease): Stable no active signs or symptoms.   .Anxiety: Well compensated   LOS: 3 days  MATTHEWS,MICHELLE A. PAGER [phone]

## 2011-08-29 ENCOUNTER — Encounter (HOSPITAL_BASED_OUTPATIENT_CLINIC_OR_DEPARTMENT_OTHER): Payer: Self-pay | Admitting: *Deleted

## 2011-08-29 ENCOUNTER — Emergency Department (HOSPITAL_BASED_OUTPATIENT_CLINIC_OR_DEPARTMENT_OTHER): Payer: Medicare Other

## 2011-08-29 ENCOUNTER — Emergency Department (HOSPITAL_BASED_OUTPATIENT_CLINIC_OR_DEPARTMENT_OTHER)
Admission: EM | Admit: 2011-08-29 | Discharge: 2011-08-29 | Disposition: A | Discharge status: Home / Self Care | Payer: Medicare Other | Attending: Emergency Medicine | Admitting: Emergency Medicine

## 2011-08-29 DIAGNOSIS — W19XXXA Unspecified fall, initial encounter: Secondary | ICD-10-CM

## 2011-08-29 DIAGNOSIS — T148XXA Other injury of unspecified body region, initial encounter: Secondary | ICD-10-CM

## 2011-08-29 DIAGNOSIS — S022XXA Fracture of nasal bones, initial encounter for closed fracture: Secondary | ICD-10-CM | POA: Insufficient documentation

## 2011-08-29 DIAGNOSIS — F411 Generalized anxiety disorder: Secondary | ICD-10-CM | POA: Insufficient documentation

## 2011-08-29 DIAGNOSIS — S0181XA Laceration without foreign body of other part of head, initial encounter: Secondary | ICD-10-CM

## 2011-08-29 DIAGNOSIS — J4489 Other specified chronic obstructive pulmonary disease: Secondary | ICD-10-CM | POA: Insufficient documentation

## 2011-08-29 DIAGNOSIS — I4891 Unspecified atrial fibrillation: Secondary | ICD-10-CM | POA: Insufficient documentation

## 2011-08-29 DIAGNOSIS — W108XXA Fall (on) (from) other stairs and steps, initial encounter: Secondary | ICD-10-CM | POA: Insufficient documentation

## 2011-08-29 DIAGNOSIS — S0180XA Unspecified open wound of other part of head, initial encounter: Secondary | ICD-10-CM | POA: Insufficient documentation

## 2011-08-29 DIAGNOSIS — F0280 Dementia in other diseases classified elsewhere without behavioral disturbance: Secondary | ICD-10-CM | POA: Insufficient documentation

## 2011-08-29 DIAGNOSIS — S0003XA Contusion of scalp, initial encounter: Secondary | ICD-10-CM | POA: Insufficient documentation

## 2011-08-29 DIAGNOSIS — J449 Chronic obstructive pulmonary disease, unspecified: Secondary | ICD-10-CM | POA: Insufficient documentation

## 2011-08-29 DIAGNOSIS — I2510 Atherosclerotic heart disease of native coronary artery without angina pectoris: Secondary | ICD-10-CM | POA: Insufficient documentation

## 2011-08-29 DIAGNOSIS — Z79899 Other long term (current) drug therapy: Secondary | ICD-10-CM | POA: Insufficient documentation

## 2011-08-29 DIAGNOSIS — G309 Alzheimer's disease, unspecified: Secondary | ICD-10-CM | POA: Insufficient documentation

## 2011-08-29 DIAGNOSIS — I129 Hypertensive chronic kidney disease with stage 1 through stage 4 chronic kidney disease, or unspecified chronic kidney disease: Secondary | ICD-10-CM | POA: Insufficient documentation

## 2011-08-29 DIAGNOSIS — N183 Chronic kidney disease, stage 3 unspecified: Secondary | ICD-10-CM | POA: Insufficient documentation

## 2011-08-29 MED ORDER — LIDOCAINE HCL 2 % IJ SOLN
10.0000 mL | Freq: Once | INTRAMUSCULAR | Status: AC
  Administered 2011-08-29: 200 mg
  Filled 2011-08-29: qty 1

## 2011-08-29 MED ORDER — HYDROCODONE-ACETAMINOPHEN 5-500 MG PO TABS: 1.0000 | ORAL_TABLET | Freq: Four times a day (QID) | ORAL | Status: AC | PRN

## 2011-08-29 MED ORDER — HYDROCODONE-ACETAMINOPHEN 5-325 MG PO TABS
1.0000 | ORAL_TABLET | Freq: Once | ORAL | Status: AC
  Administered 2011-08-29: 1 via ORAL
  Filled 2011-08-29: qty 1

## 2011-08-29 NOTE — Discharge Instructions (Signed)

## 2011-08-29 NOTE — ED Notes (Signed)
Pt fell on cement steps earlier today. Broke glasses. Left side of face swollen. Lac noted. Also c/o left knee pain. Bruising noted at sites. Taken to [facility]. Ice pack applied. Bleeding controlled.

## 2011-08-29 NOTE — ED Provider Notes (Addendum)
History     CSN: [id number]  Arrival date & time 08/29/11  1317   First MD Initiated Contact with Patient 08/29/11 1327      Chief Complaint  Patient presents with  . Fall    (Consider location/radiation/quality/duration/timing/severity/associated sxs/prior treatment) Patient is a 78 y.o. female presenting with fall. The history is provided by the patient.  Fall The accident occurred 1 to 2 hours ago. The fall occurred while walking (walking up stairs and lot balance falling down 1 stair onto the concrete). She fell from a height of 1 to 2 ft. She landed on concrete. The volume of blood lost was moderate. The point of impact was the head. The pain is present in the head (left face). The pain is at a severity of 5/10. The pain is moderate. She was ambulatory at the scene. Pertinent negatives include no numbness, no abdominal pain, no nausea, no vomiting, no headaches, no loss of consciousness and no tingling. The symptoms are aggravated by pressure on the injury. Prehospitalization: none. She has tried ice for the symptoms. The treatment provided mild relief.    Past Medical History  Diagnosis Date  . Alzheimer disease prolapsed bladder  . Atrial fibrillation   . Anxiety   . Hypertension   . COPD (chronic obstructive pulmonary disease)   . Hx: UTI (urinary tract infection)   . Chronic kidney disease, stage III (moderate)   . GERD (gastroesophageal reflux disease)   . Dyslipidemia   . Hypothyroid   . CAD (coronary artery disease), native coronary artery     H/O PTCA, balloon angioplasty Mid LAD in 2001, 4.0x18 vision in proximal RCA 3.2010 and cardiac cath 01/2009 revealed patent PCI sites  . H/O: GI bleed     Massive rectal bleed 2008. Not a coumadin candidate. A. Fib without recurrence.      Past Surgical History  Procedure Date  . Coronary angioplasty with stent placement bladder tacking  . Myocardial infarct   . Total vaginal hysterectomy     History reviewed. No  pertinent family history.  History  Substance Use Topics  . Smoking status: Former Smoker    Quit date: 03/18/1959  . Smokeless tobacco: Not on file  . Alcohol Use: 0.6 oz/week    1 Glasses of wine per week    OB History    Grav Para Term Preterm Abortions TAB SAB Ect Mult Living   4 4 4       4       Review of Systems  Gastrointestinal: Negative for nausea, vomiting and abdominal pain.  Neurological: Negative for tingling, loss of consciousness, numbness and headaches.  All other systems reviewed and are negative.    Allergies  Codeine  Home Medications   Current Outpatient Rx  Name Route Sig Dispense Refill  . ALISKIREN FUMARATE 300 MG PO TABS Oral Take 300 mg by mouth daily.      . ALLOPURINOL 300 MG PO TABS Oral Take 150 mg by mouth daily.      . ASPIRIN 81 MG PO CHEW Oral Chew 1 tablet (81 mg total) by mouth daily. 30 tablet 0  . CHLORDIAZEPOXIDE HCL 10 MG PO CAPS Oral Take 10 mg by mouth 2 (two) times daily as needed. For anxiety     . CITALOPRAM HYDROBROMIDE 10 MG PO TABS Oral Take 10 mg by mouth daily.      Marland Kitchen CLOPIDOGREL BISULFATE 75 MG PO TABS Oral Take 75 mg by mouth daily.      Marland Kitchen  DILTIAZEM HCL ER COATED BEADS 120 MG PO CP24 Oral Take 1 capsule (120 mg total) by mouth 2 (two) times daily. 60 capsule 0  . FAMOTIDINE 40 MG PO TABS Oral Take 40 mg by mouth at bedtime as needed. For heartburn/reflux.     . FUROSEMIDE 20 MG PO TABS Oral Take 20 mg by mouth daily.      . ISOSORBIDE MONONITRATE ER 30 MG PO TB24 Oral Take 30 mg by mouth daily.      Marland Kitchen LEVOTHYROXINE SODIUM 75 MCG PO TABS Oral Take 1 tablet (75 mcg total) by mouth daily. 30 tablet 0  . POLYSACCHARIDE IRON 150 MG PO CAPS Oral Take 150 mg by mouth 2 (two) times daily.      . QUINAPRIL HCL 40 MG PO TABS Oral Take 80 mg by mouth daily.      Marland Kitchen SIMVASTATIN 40 MG PO TABS Oral Take 40 mg by mouth daily.        BP 141/118  Pulse 86  Temp(Src) 97.8 F (36.6 C) (Oral)  Resp 20  Ht 5\' 4"  (1.626 m)  Wt 135 lb  (61.236 kg)  BMI 23.17 kg/m2  SpO2 95%  Physical Exam  Nursing note and vitals reviewed. Constitutional: She is oriented to person, place, and time. She appears well-developed and well-nourished. No distress.  HENT:  Head: Normocephalic. Head is with contusion and with laceration.    Mouth/Throat: Oropharynx is clear and moist.  Eyes: Conjunctivae and EOM are normal. Pupils are equal, round, and reactive to light.  Neck: Normal range of motion. Neck supple. No spinous process tenderness and no muscular tenderness present.  Cardiovascular: Normal rate, regular rhythm and intact distal pulses.   No murmur heard. Pulmonary/Chest: Effort normal and breath sounds normal. No respiratory distress. She has no wheezes. She has no rales.  Abdominal: Soft. She exhibits no distension. There is no tenderness. There is no rebound and no guarding.  Musculoskeletal: Normal range of motion. She exhibits no edema and no tenderness.       Hands:      Legs: Neurological: She is alert and oriented to person, place, and time.  Skin: Skin is warm and dry. No rash noted. No erythema.  Psychiatric: She has a normal mood and affect. Her behavior is normal.    ED Course  Procedures (including critical care time)  Labs Reviewed - No data to display Ct Head Wo Contrast  08/29/2011  *RADIOLOGY REPORT*  Clinical Data:  Fall.  CT HEAD WITHOUT CONTRAST CT MAXILLOFACIAL WITHOUT CONTRAST  Technique:  Multidetector CT imaging of the head and maxillofacial structures were performed using the standard protocol without intravenous contrast. Multiplanar CT image reconstructions of the maxillofacial structures were also generated.  Comparison:  Head CT 10/12/2005  CT HEAD  Findings: There is a large midline and left lateral frontal scalp hematoma.  Hematoma measures up to 8 cm transverse diameter and 1.7 cm in anterior-posterior thickness.  There are some locules of gas within the subcutaneous soft tissues, compatible with  laceration. Soft tissue swelling extends inferiorly, anterior to the left orbit and involves the left cheek.  There is swelling over the bridge of the nose.  An acute angulated left nasal bone fracture is visualized.  No acute intracranial abnormality is identified.  Specifically, no acute intracranial hemorrhage, hydrocephalus, mass effect, midline shift, or acute cortically based infarction.  The skull is intact.  Visualized paranasal sinuses and mastoid air cells are clear.  IMPRESSION: 1. Large hematoma involving  the forehead, with left periorbital and left cheek soft tissue swelling. 2.  No acute intracranial abnormality. 3.  Acute angulated left nasal bone fracture.  CT MAXILLOFACIAL  Findings:   There is an acute left nasal bone fracture.  The proximal portion of the nasal bone is fractured and is deviated medially and angulated.  Right nasal bone appears intact.  The mandibular condyles are located.  The mandible, maxilla, maxillary spine, pterygoid plates, zygomatic the zygomatic arches, and bony orbits are intact.  There is a large hematoma involving the soft tissues of the forehead, most prominent in the midline and to the left of midline. There is prominent soft tissue swelling over the nose, left greater than right, soft tissue swelling of the left periorbital soft tissues, and the left cheek.  Both globes demonstrate postsurgical changes.  The globes are intact.  Retro-orbital fat planes are preserved.  IMPRESSION:  1.  Acute left nasal bone fracture. 2.  The remainder of the facial bones are intact. 3.  Prominent soft tissue swelling/hematoma formation of the forehead, left face, and nose.  Original Report Authenticated By: Britta Mccreedy, M.D.   Dg Hand Complete Right  08/29/2011  *RADIOLOGY REPORT*  Clinical Data: Fall  RIGHT HAND - COMPLETE 3+ VIEW  Comparison: None.  Findings: Severe deformity at the first metacarpal phalangeal joint has a chronic appearance.  Osteopenia.  Osteoarthritic changes  within the IP joints are noted.  Erosive changes involving the heads of the first second and third metacarpals are noted and are nonspecific.  Vascular calcifications.  IMPRESSION: No acute bony pathology.  Chronic changes.  Original Report Authenticated By: Donavan Burnet, M.D.   Ct Maxillofacial Wo Cm  08/29/2011  *RADIOLOGY REPORT*  Clinical Data:  Fall.  CT HEAD WITHOUT CONTRAST CT MAXILLOFACIAL WITHOUT CONTRAST  Technique:  Multidetector CT imaging of the head and maxillofacial structures were performed using the standard protocol without intravenous contrast. Multiplanar CT image reconstructions of the maxillofacial structures were also generated.  Comparison:  Head CT 10/12/2005  CT HEAD  Findings: There is a large midline and left lateral frontal scalp hematoma.  Hematoma measures up to 8 cm transverse diameter and 1.7 cm in anterior-posterior thickness.  There are some locules of gas within the subcutaneous soft tissues, compatible with laceration. Soft tissue swelling extends inferiorly, anterior to the left orbit and involves the left cheek.  There is swelling over the bridge of the nose.  An acute angulated left nasal bone fracture is visualized.  No acute intracranial abnormality is identified.  Specifically, no acute intracranial hemorrhage, hydrocephalus, mass effect, midline shift, or acute cortically based infarction.  The skull is intact.  Visualized paranasal sinuses and mastoid air cells are clear.  IMPRESSION: 1. Large hematoma involving the forehead, with left periorbital and left cheek soft tissue swelling. 2.  No acute intracranial abnormality. 3.  Acute angulated left nasal bone fracture.  CT MAXILLOFACIAL  Findings:   There is an acute left nasal bone fracture.  The proximal portion of the nasal bone is fractured and is deviated medially and angulated.  Right nasal bone appears intact.  The mandibular condyles are located.  The mandible, maxilla, maxillary spine, pterygoid plates,  zygomatic the zygomatic arches, and bony orbits are intact.  There is a large hematoma involving the soft tissues of the forehead, most prominent in the midline and to the left of midline. There is prominent soft tissue swelling over the nose, left greater than right, soft tissue swelling of the  left periorbital soft tissues, and the left cheek.  Both globes demonstrate postsurgical changes.  The globes are intact.  Retro-orbital fat planes are preserved.  IMPRESSION:  1.  Acute left nasal bone fracture. 2.  The remainder of the facial bones are intact. 3.  Prominent soft tissue swelling/hematoma formation of the forehead, left face, and nose.  Original Report Authenticated By: Britta Mccreedy, M.D.   LACERATION REPAIR Performed by: Gwyneth Sprout Authorized by: Gwyneth Sprout Consent: Verbal consent obtained. Risks and benefits: risks, benefits and alternatives were discussed Consent given by: patient Patient identity confirmed: provided demographic data Prepped and Draped in normal sterile fashion Wound explored  Laceration Location: forehead  Laceration Length: 3cm  No Foreign Bodies seen or palpated  Anesthesia: local infiltration  Local anesthetic: lidocaine 1% without epinephrine  Anesthetic total: 5 ml  Irrigation method: syringe Amount of cleaning: standard  Skin closure: 6.0 prolene  Number of sutures: 5  Technique: simple interrupted  Patient tolerance: Patient tolerated the procedure well with no immediate complications.   1. Fall   2. Hematoma   3. Facial laceration   4. Nasal fracture       MDM   Patient with mechanical fall today with no LOC the patient is on Plavix. She hit her face on the concrete and has significant swelling and tenderness over the left orbit and forehead. She has normal extraocular eye movements without any sign of entrapment. There is no tenderness to the upper extremities were lower extremities except for ecchymosis of the left knee  but she is able to ambulate without difficulty. Also she has some moderate swelling over the right fifth MCP joint  3:14 PM Wound repaired as above.  Ct neg except for nasal bone fx.  Plain films neg.  Pt given pain control and d/ced home.      Gwyneth Sprout, MD 08/29/11 1517  Gwyneth Sprout, MD 08/29/11 1521

## 2012-05-28 ENCOUNTER — Emergency Department (HOSPITAL_COMMUNITY)
Admission: EM | Admit: 2012-05-28 | Discharge: 2012-05-28 | Disposition: A | Discharge status: Home / Self Care | Payer: Medicare Other | Attending: Emergency Medicine | Admitting: Emergency Medicine

## 2012-05-28 ENCOUNTER — Encounter (HOSPITAL_COMMUNITY): Payer: Self-pay | Admitting: Emergency Medicine

## 2012-05-28 ENCOUNTER — Emergency Department (HOSPITAL_COMMUNITY): Payer: Medicare Other

## 2012-05-28 DIAGNOSIS — I129 Hypertensive chronic kidney disease with stage 1 through stage 4 chronic kidney disease, or unspecified chronic kidney disease: Secondary | ICD-10-CM | POA: Insufficient documentation

## 2012-05-28 DIAGNOSIS — R079 Chest pain, unspecified: Secondary | ICD-10-CM

## 2012-05-28 DIAGNOSIS — K219 Gastro-esophageal reflux disease without esophagitis: Secondary | ICD-10-CM | POA: Insufficient documentation

## 2012-05-28 DIAGNOSIS — Z87891 Personal history of nicotine dependence: Secondary | ICD-10-CM | POA: Insufficient documentation

## 2012-05-28 DIAGNOSIS — N390 Urinary tract infection, site not specified: Secondary | ICD-10-CM

## 2012-05-28 DIAGNOSIS — E785 Hyperlipidemia, unspecified: Secondary | ICD-10-CM | POA: Insufficient documentation

## 2012-05-28 DIAGNOSIS — I252 Old myocardial infarction: Secondary | ICD-10-CM | POA: Insufficient documentation

## 2012-05-28 DIAGNOSIS — Z8679 Personal history of other diseases of the circulatory system: Secondary | ICD-10-CM | POA: Insufficient documentation

## 2012-05-28 DIAGNOSIS — Z7902 Long term (current) use of antithrombotics/antiplatelets: Secondary | ICD-10-CM | POA: Insufficient documentation

## 2012-05-28 DIAGNOSIS — J4489 Other specified chronic obstructive pulmonary disease: Secondary | ICD-10-CM | POA: Insufficient documentation

## 2012-05-28 DIAGNOSIS — E039 Hypothyroidism, unspecified: Secondary | ICD-10-CM | POA: Insufficient documentation

## 2012-05-28 DIAGNOSIS — N183 Chronic kidney disease, stage 3 unspecified: Secondary | ICD-10-CM | POA: Insufficient documentation

## 2012-05-28 DIAGNOSIS — I2510 Atherosclerotic heart disease of native coronary artery without angina pectoris: Secondary | ICD-10-CM | POA: Insufficient documentation

## 2012-05-28 DIAGNOSIS — R0789 Other chest pain: Secondary | ICD-10-CM | POA: Insufficient documentation

## 2012-05-28 DIAGNOSIS — F411 Generalized anxiety disorder: Secondary | ICD-10-CM | POA: Insufficient documentation

## 2012-05-28 DIAGNOSIS — Z79899 Other long term (current) drug therapy: Secondary | ICD-10-CM | POA: Insufficient documentation

## 2012-05-28 DIAGNOSIS — J449 Chronic obstructive pulmonary disease, unspecified: Secondary | ICD-10-CM | POA: Insufficient documentation

## 2012-05-28 DIAGNOSIS — Z87440 Personal history of urinary (tract) infections: Secondary | ICD-10-CM | POA: Insufficient documentation

## 2012-05-28 DIAGNOSIS — Z7982 Long term (current) use of aspirin: Secondary | ICD-10-CM | POA: Insufficient documentation

## 2012-05-28 DIAGNOSIS — F0390 Unspecified dementia without behavioral disturbance: Secondary | ICD-10-CM | POA: Insufficient documentation

## 2012-05-28 LAB — COMPREHENSIVE METABOLIC PANEL
AST: 12 U/L (ref 0–37)
Albumin: 3.7 g/dL (ref 3.5–5.2)
Alkaline Phosphatase: 105 U/L (ref 39–117)
BUN: 20 mg/dL (ref 6–23)
Creatinine, Ser: 0.94 mg/dL (ref 0.50–1.10)
Potassium: 4.2 mEq/L (ref 3.5–5.1)
Total Protein: 6.7 g/dL (ref 6.0–8.3)

## 2012-05-28 LAB — CBC
HCT: 35.6 % — ABNORMAL LOW (ref 36.0–46.0)
Hemoglobin: 12.4 g/dL (ref 12.0–15.0)
MCH: 28.2 pg (ref 26.0–34.0)
RBC: 4.39 MIL/uL (ref 3.87–5.11)

## 2012-05-28 LAB — URINALYSIS, ROUTINE W REFLEX MICROSCOPIC
Glucose, UA: NEGATIVE mg/dL
pH: 6.5 (ref 5.0–8.0)

## 2012-05-28 LAB — TROPONIN I: Troponin I: <0.3 ng/mL (ref <0.30)

## 2012-05-28 LAB — URINE MICROSCOPIC-ADD ON

## 2012-05-28 LAB — POCT I-STAT TROPONIN I: Troponin i, poc: 0 ng/mL (ref 0.00–0.08)

## 2012-05-28 MED ORDER — CEPHALEXIN 500 MG PO CAPS: 500.0000 mg | ORAL_CAPSULE | Freq: Four times a day (QID) | ORAL | Status: DC

## 2012-05-28 MED ORDER — CEPHALEXIN 250 MG PO CAPS
500.0000 mg | ORAL_CAPSULE | Freq: Once | ORAL | Status: AC
  Administered 2012-05-28: 500 mg via ORAL
  Filled 2012-05-28: qty 2

## 2012-05-28 MED ORDER — SODIUM CHLORIDE 0.9 % IV SOLN
20.0000 mL | INTRAVENOUS | Status: DC
  Administered 2012-05-28: 10:00:00 via INTRAVENOUS

## 2012-05-28 NOTE — ED Notes (Signed)
Report given to Audrey, RN.

## 2012-05-28 NOTE — ED Notes (Signed)
Error made documenting 4 Baby ASA.

## 2012-05-28 NOTE — ED Notes (Signed)
Pt. Comes from [employer] with some CP rates it a  8.. Given $ Baby ASa, 2 NTG. 18g in lt. Wrist. Pt rates pain after the 1st NTG.

## 2012-05-28 NOTE — ED Notes (Signed)
Pt. Stated, this has happened before.Not really sure what they did for it.

## 2012-05-28 NOTE — ED Provider Notes (Signed)
History     CSN: [id number]  Arrival date & time 05/28/12  0913   First MD Initiated Contact with Patient 05/28/12 0930      Chief Complaint  Patient presents with  . Chest Pain    (Consider location/radiation/quality/duration/timing/severity/associated sxs/prior treatment) HPI Comments: [name] is a 79 y.o. Female who developed chest pain, this morning. She reported that the pain was initially 8/10, and is now 2/10. The pain is dull. It is, in the left upper chest. The pain does not radiate. She was treated with 324 mg a past and 2 sublingual nitroglycerin. Subsequently, her pain improved. She has had this pain before and has previously used nitroglycerin. She does not, know she's never seen a cardiologist. She does not know her PCP. She is a somewhat poor historian. She is unable to relate any associated symptoms. She does not know if there are any other modifying factors.  Patient is a 79 y.o. female presenting with chest pain. The history is provided by the patient.  Chest Pain   Past Medical History  Diagnosis Date  . Alzheimer disease prolapsed bladder  . Atrial fibrillation   . Anxiety   . Hypertension   . COPD (chronic obstructive pulmonary disease)   . Hx: UTI (urinary tract infection)   . Chronic kidney disease, stage III (moderate)   . GERD (gastroesophageal reflux disease)   . Dyslipidemia   . Hypothyroid   . CAD (coronary artery disease), native coronary artery     H/O PTCA, balloon angioplasty Mid LAD in 2001, 4.0x18 vision in proximal RCA 3.2010 and cardiac cath 01/2009 revealed patent PCI sites  . H/O: GI bleed     Massive rectal bleed 2008. Not a coumadin candidate. A. Fib without recurrence.      Past Surgical History  Procedure Laterality Date  . Coronary angioplasty with stent placement  bladder tacking  . Myocardial infarct    . Total vaginal hysterectomy      No family history on file.  History  Substance Use Topics  . Smoking status:  Former Smoker    Quit date: 03/18/1959  . Smokeless tobacco: Not on file  . Alcohol Use: 0.6 oz/week    1 Glasses of wine per week    OB History   Grav Para Term Preterm Abortions TAB SAB Ect Mult Living   4 4 4       4       Review of Systems  Cardiovascular: Positive for chest pain.  All other systems reviewed and are negative.    Allergies  Codeine  Home Medications   Current Outpatient Rx  Name  Route  Sig  Dispense  Refill  . acetaminophen (TYLENOL) 650 MG CR tablet   Oral   Take 650 mg by mouth every 8 (eight) hours as needed for pain.         . chlordiazePOXIDE (LIBRIUM) 10 MG capsule   Oral   Take 10 mg by mouth 2 (two) times daily as needed. For anxiety          . ferrous sulfate 325 (65 FE) MG tablet   Oral   Take 325 mg by mouth 2 (two) times daily.         Marland Kitchen HYDROcodone-acetaminophen (VICODIN) 5-500 MG per tablet   Oral   Take 1 tablet by mouth every 6 (six) hours as needed for pain.         . nitroGLYCERIN (NITROSTAT) 0.4 MG SL tablet  Sublingual   Place 0.4 mg under the tongue every 5 (five) minutes as needed for chest pain.         . potassium chloride (K-DUR,KLOR-CON) 10 MEQ tablet   Oral   Take 10 mEq by mouth daily.         Marland Kitchen allopurinol (ZYLOPRIM) 300 MG tablet   Oral   Take 150 mg by mouth daily. Patient uses one half of the tablet.         . cephALEXin (KEFLEX) 500 MG capsule   Oral   Take 1 capsule (500 mg total) by mouth 4 (four) times daily.   28 capsule   0   . citalopram (CELEXA) 10 MG tablet   Oral   Take 10 mg by mouth daily.           . clopidogrel (PLAVIX) 75 MG tablet   Oral   Take 75 mg by mouth daily.           Marland Kitchen EXPIRED: diltiazem (CARDIZEM CD) 120 MG 24 hr capsule   Oral   Take 1 capsule (120 mg total) by mouth 2 (two) times daily.   60 capsule   0   . famotidine (PEPCID) 40 MG tablet   Oral   Take 40 mg by mouth at bedtime as needed. For heartburn/reflux.          . furosemide (LASIX)  20 MG tablet   Oral   Take 20 mg by mouth daily.           . isosorbide mononitrate (IMDUR) 30 MG 24 hr tablet   Oral   Take 30 mg by mouth daily.           Marland Kitchen EXPIRED: levothyroxine (SYNTHROID) 75 MCG tablet   Oral   Take 1 tablet (75 mcg total) by mouth daily.   30 tablet   0   . memantine (NAMENDA) 10 MG tablet   Oral   Take 10 mg by mouth 2 (two) times daily.         . quinapril (ACCUPRIL) 40 MG tablet   Oral   Take 40 mg by mouth daily.          . simvastatin (ZOCOR) 10 MG tablet   Oral   Take 10 mg by mouth at bedtime.         . traMADol (ULTRAM) 50 MG tablet   Oral   Take 50 mg by mouth every 6 (six) hours as needed for pain.            BP 147/80  Pulse 118  Temp(Src) 98.2 F (36.8 C) (Oral)  Resp 20  SpO2 97%  Physical Exam  Nursing note and vitals reviewed. Constitutional: She appears well-developed and well-nourished.  HENT:  Head: Normocephalic and atraumatic.  Eyes: Conjunctivae and EOM are normal. Pupils are equal, round, and reactive to light.  Neck: Normal range of motion and phonation normal. Neck supple.  Cardiovascular: Normal rate, regular rhythm and intact distal pulses.   Pulmonary/Chest: Effort normal and breath sounds normal. She exhibits tenderness (left anterior, moderate. No associated deformity, redness, rash.).  Abdominal: Soft. She exhibits no distension. There is no tenderness. There is no guarding.  Musculoskeletal: Normal range of motion.  Neurological: She is alert. She has normal strength. No cranial nerve deficit. She exhibits normal muscle tone. Coordination normal.  Skin: Skin is warm and dry.  Psychiatric: Her behavior is normal.    ED Course  Procedures (including critical care  time)  The patient has mild tachycardia.  IV fluids, ordered for tachycardia.  Evaluation for NSTEMI    Date: 01/21/2012  Rate: 110  Rhythm: atrial fibrillation  QRS Axis: normal  PR and QT Intervals: Normal QT  ST/T Wave  abnormalities: nonspecific T wave changes  PR and QRS Conduction Disutrbances:Normal QT  Narrative Interpretation:   Old EKG Reviewed: unchanged  Reevaluation: 16:40- she states that she has minimal chest pain. She continues to be tender on the left anterior upper chest wall. Numerous vital signs have been taken. She has had mostly normal. Blood pressures. She had one blood pressure was 96/35. It is, unclear what her symptoms were, at that time.  16:50- Patient is ambulated in the ED, and blood pressures rechecked-blood pressure was normal. With ambulation trial. She was mildly tachycardic with ambulation. When she sat down and rested. The heart rate was 104, at 17:02. The patient was calm, and comfortable. She stated that she would drink more water, when she went home.     Labs Reviewed  CBC - Abnormal; Notable for the following:    HCT 35.6 (*)    All other components within normal limits  COMPREHENSIVE METABOLIC PANEL - Abnormal; Notable for the following:    Glucose, Bld 104 (*)    GFR calc non Af Amer 54 (*)    GFR calc Af Amer 63 (*)    All other components within normal limits  URINALYSIS, ROUTINE W REFLEX MICROSCOPIC - Abnormal; Notable for the following:    APPearance CLOUDY (*)    Hgb urine dipstick SMALL (*)    Protein, ur 30 (*)    Leukocytes, UA LARGE (*)    All other components within normal limits  URINE MICROSCOPIC-ADD ON - Abnormal; Notable for the following:    Bacteria, UA MANY (*)    All other components within normal limits  URINE CULTURE  TROPONIN I  POCT I-STAT TROPONIN I   Dg Chest Portable 1 View  05/28/2012  *RADIOLOGY REPORT*  Clinical Data: Chest pain  PORTABLE CHEST - 1 VIEW  Comparison: 03/18/2011  Findings: The cardiomediastinal silhouette is stable. Again noted mild interstitial prominence and chronic bronchitic changes. No acute infiltrate or pleural effusion.  No pulmonary edema.  IMPRESSION: No active disease.  Stable mild interstitial prominence.    Original Report Authenticated By: Natasha Mead, M.D.    Nursing notes, applicable records and vitals reviewed.  Radiologic Images/Reports reviewed.   1. Nonspecific chest pain   2. UTI (lower urinary tract infection)       MDM  Nonspecific chest pain, with a component of chest wall, tenderness. Doubt ACS, PE, occult infection, or metabolic instability. She has an apparent UTI.  She is improved, with treatment in the ED.     Plan: Home Medications- Keflex; Home Treatments- rest, fluids; Recommended follow up- PCP. Checkup, one week     Flint Melter, MD 05/28/12 [phone]

## 2012-05-28 NOTE — ED Notes (Signed)
Ambulated pt per DR.Wentz. Pt states no issues during ambulation, feels fine denies cp, sob or dizziness. BP after 147/80, pulse 118.

## 2012-05-30 LAB — URINE CULTURE: Colony Count: >=100000

## 2012-05-31 ENCOUNTER — Telehealth (HOSPITAL_COMMUNITY): Payer: Self-pay | Admitting: Emergency Medicine

## 2012-05-31 NOTE — ED Notes (Signed)
+  Urine. Patient treated with Keflex. Sensitive to same. Per protocol MD. °

## 2012-05-31 NOTE — ED Notes (Signed)
Patient has +Urine culture. Checking to see if appropriately treated. °

## 2013-11-28 ENCOUNTER — Emergency Department (HOSPITAL_COMMUNITY): Payer: Medicare Other

## 2013-11-28 ENCOUNTER — Emergency Department (HOSPITAL_COMMUNITY)
Admission: EM | Admit: 2013-11-28 | Discharge: 2013-11-28 | Disposition: A | Discharge status: Home / Self Care | Payer: Medicare Other | Attending: Emergency Medicine | Admitting: Emergency Medicine

## 2013-11-28 ENCOUNTER — Encounter (HOSPITAL_COMMUNITY): Payer: Self-pay | Admitting: Emergency Medicine

## 2013-11-28 DIAGNOSIS — I129 Hypertensive chronic kidney disease with stage 1 through stage 4 chronic kidney disease, or unspecified chronic kidney disease: Secondary | ICD-10-CM | POA: Diagnosis not present

## 2013-11-28 DIAGNOSIS — I4891 Unspecified atrial fibrillation: Secondary | ICD-10-CM | POA: Insufficient documentation

## 2013-11-28 DIAGNOSIS — E039 Hypothyroidism, unspecified: Secondary | ICD-10-CM | POA: Diagnosis not present

## 2013-11-28 DIAGNOSIS — J449 Chronic obstructive pulmonary disease, unspecified: Secondary | ICD-10-CM | POA: Insufficient documentation

## 2013-11-28 DIAGNOSIS — Z7902 Long term (current) use of antithrombotics/antiplatelets: Secondary | ICD-10-CM | POA: Diagnosis not present

## 2013-11-28 DIAGNOSIS — G309 Alzheimer's disease, unspecified: Secondary | ICD-10-CM | POA: Insufficient documentation

## 2013-11-28 DIAGNOSIS — N183 Chronic kidney disease, stage 3 unspecified: Secondary | ICD-10-CM | POA: Insufficient documentation

## 2013-11-28 DIAGNOSIS — Z792 Long term (current) use of antibiotics: Secondary | ICD-10-CM | POA: Insufficient documentation

## 2013-11-28 DIAGNOSIS — F0280 Dementia in other diseases classified elsewhere without behavioral disturbance: Secondary | ICD-10-CM | POA: Insufficient documentation

## 2013-11-28 DIAGNOSIS — Z87891 Personal history of nicotine dependence: Secondary | ICD-10-CM | POA: Diagnosis not present

## 2013-11-28 DIAGNOSIS — R000 Tachycardia, unspecified: Secondary | ICD-10-CM | POA: Insufficient documentation

## 2013-11-28 DIAGNOSIS — Z79899 Other long term (current) drug therapy: Secondary | ICD-10-CM | POA: Insufficient documentation

## 2013-11-28 DIAGNOSIS — Z9861 Coronary angioplasty status: Secondary | ICD-10-CM | POA: Insufficient documentation

## 2013-11-28 DIAGNOSIS — E785 Hyperlipidemia, unspecified: Secondary | ICD-10-CM | POA: Diagnosis not present

## 2013-11-28 DIAGNOSIS — R51 Headache: Secondary | ICD-10-CM | POA: Insufficient documentation

## 2013-11-28 DIAGNOSIS — K219 Gastro-esophageal reflux disease without esophagitis: Secondary | ICD-10-CM | POA: Diagnosis not present

## 2013-11-28 DIAGNOSIS — F411 Generalized anxiety disorder: Secondary | ICD-10-CM | POA: Insufficient documentation

## 2013-11-28 DIAGNOSIS — I2510 Atherosclerotic heart disease of native coronary artery without angina pectoris: Secondary | ICD-10-CM | POA: Diagnosis not present

## 2013-11-28 DIAGNOSIS — J4489 Other specified chronic obstructive pulmonary disease: Secondary | ICD-10-CM | POA: Insufficient documentation

## 2013-11-28 DIAGNOSIS — R519 Headache, unspecified: Secondary | ICD-10-CM

## 2013-11-28 DIAGNOSIS — J029 Acute pharyngitis, unspecified: Secondary | ICD-10-CM | POA: Insufficient documentation

## 2013-11-28 DIAGNOSIS — Z87440 Personal history of urinary (tract) infections: Secondary | ICD-10-CM | POA: Insufficient documentation

## 2013-11-28 LAB — CBC WITH DIFFERENTIAL/PLATELET
BASOS PCT: 0 % (ref 0–1)
Basophils Absolute: 0 10*3/uL (ref 0.0–0.1)
EOS ABS: 0.2 10*3/uL (ref 0.0–0.7)
Eosinophils Relative: 2 % (ref 0–5)
HEMATOCRIT: 38.1 % (ref 36.0–46.0)
HEMOGLOBIN: 12.9 g/dL (ref 12.0–15.0)
LYMPHS ABS: 2 10*3/uL (ref 0.7–4.0)
Lymphocytes Relative: 25 % (ref 12–46)
MCH: 27.8 pg (ref 26.0–34.0)
MCHC: 33.9 g/dL (ref 30.0–36.0)
MCV: 82.1 fL (ref 78.0–100.0)
MONO ABS: 0.7 10*3/uL (ref 0.1–1.0)
MONOS PCT: 9 % (ref 3–12)
NEUTROS PCT: 64 % (ref 43–77)
Neutro Abs: 5.2 10*3/uL (ref 1.7–7.7)
Platelets: 221 10*3/uL (ref 150–400)
RBC: 4.64 MIL/uL (ref 3.87–5.11)
RDW: 14 % (ref 11.5–15.5)
WBC: 8.2 10*3/uL (ref 4.0–10.5)

## 2013-11-28 LAB — COMPREHENSIVE METABOLIC PANEL
ALBUMIN: 3.9 g/dL (ref 3.5–5.2)
ALK PHOS: 107 U/L (ref 39–117)
ALT: 6 U/L (ref 0–35)
ANION GAP: 15 (ref 5–15)
AST: 13 U/L (ref 0–37)
BILIRUBIN TOTAL: 0.6 mg/dL (ref 0.3–1.2)
BUN: 28 mg/dL — AB (ref 6–23)
CO2: 27 mEq/L (ref 19–32)
CREATININE: 1.03 mg/dL (ref 0.50–1.10)
Calcium: 9.6 mg/dL (ref 8.4–10.5)
Chloride: 90 mEq/L — ABNORMAL LOW (ref 96–112)
GFR calc non Af Amer: 48 mL/min — ABNORMAL LOW (ref >90)
GFR, EST AFRICAN AMERICAN: 56 mL/min — AB (ref >90)
GLUCOSE: 102 mg/dL — AB (ref 70–99)
POTASSIUM: 4.1 meq/L (ref 3.7–5.3)
Sodium: 132 mEq/L — ABNORMAL LOW (ref 137–147)
TOTAL PROTEIN: 7 g/dL (ref 6.0–8.3)

## 2013-11-28 LAB — URINALYSIS, ROUTINE W REFLEX MICROSCOPIC
BILIRUBIN URINE: NEGATIVE
Glucose, UA: NEGATIVE mg/dL
Hgb urine dipstick: NEGATIVE
KETONES UR: NEGATIVE mg/dL
NITRITE: NEGATIVE
Protein, ur: NEGATIVE mg/dL
SPECIFIC GRAVITY, URINE: 1.012 (ref 1.005–1.030)
UROBILINOGEN UA: 0.2 mg/dL (ref 0.0–1.0)
pH: 5.5 (ref 5.0–8.0)

## 2013-11-28 LAB — URINE MICROSCOPIC-ADD ON

## 2013-11-28 MED ORDER — SODIUM CHLORIDE 0.9 % IV BOLUS (SEPSIS)
500.0000 mL | Freq: Once | INTRAVENOUS | Status: AC
  Administered 2013-11-28: 500 mL via INTRAVENOUS

## 2013-11-28 MED ORDER — DIPHENHYDRAMINE HCL 25 MG PO CAPS
25.0000 mg | ORAL_CAPSULE | Freq: Once | ORAL | Status: DC
  Filled 2013-11-28: qty 1

## 2013-11-28 MED ORDER — ACETAMINOPHEN 325 MG PO TABS
650.0000 mg | ORAL_TABLET | Freq: Once | ORAL | Status: AC
  Administered 2013-11-28: 650 mg via ORAL
  Filled 2013-11-28: qty 2

## 2013-11-28 MED ORDER — LORATADINE 10 MG PO TABS
10.0000 mg | ORAL_TABLET | ORAL | Status: AC
  Administered 2013-11-28: 10 mg via ORAL
  Filled 2013-11-28: qty 1

## 2013-11-28 NOTE — ED Notes (Signed)
Bed: EB58 Expected date:  Expected time:  Means of arrival:  Comments: EMS sore throat/fever

## 2013-11-28 NOTE — ED Provider Notes (Signed)
CSN: [id number]     Arrival date & time 11/28/13  1731 History   First MD Initiated Contact with Patient 11/28/13 1735     Chief Complaint  Patient presents with  . Sore Throat     (Consider location/radiation/quality/duration/timing/severity/associated sxs/prior Treatment) Patient is a 80 y.o. female presenting with pharyngitis and headaches. The history is provided by the patient.  Sore Throat This is a new problem. Episode onset: unable to specify. Associated symptoms include headaches. Pertinent negatives include no chest pain, no abdominal pain and no shortness of breath.  Headache Pain location:  L temporal and R temporal Quality:  Unable to specify Radiates to:  Does not radiate Severity currently:  Unable to specify Severity at highest:  Unable to specify Onset quality:  Gradual Duration:  1 hour Timing:  Constant Progression:  Unchanged Chronicity:  New Context comment:  At rest Relieved by:  Nothing Worsened by:  Nothing tried Ineffective treatments:  None tried Associated symptoms: cough (mild)   Associated symptoms: no abdominal pain, no back pain, no congestion, no diarrhea, no dizziness, no pain, no fatigue, no fever, no nausea, no neck pain and no vomiting     Past Medical History  Diagnosis Date  . Alzheimer disease prolapsed bladder  . Atrial fibrillation   . Anxiety   . Hypertension   . COPD (chronic obstructive pulmonary disease)   . Hx: UTI (urinary tract infection)   . Chronic kidney disease, stage III (moderate)   . GERD (gastroesophageal reflux disease)   . Dyslipidemia   . Hypothyroid   . CAD (coronary artery disease), native coronary artery     H/O PTCA, balloon angioplasty Mid LAD in 2001, 4.0x18 vision in proximal RCA 3.2010 and cardiac cath 01/2009 revealed patent PCI sites  . H/O: GI bleed     Massive rectal bleed 2008. Not a coumadin candidate. A. Fib without recurrence.     Past Surgical History  Procedure Laterality Date  . Coronary  angioplasty with stent placement  bladder tacking  . Myocardial infarct    . Total vaginal hysterectomy     No family history on file. History  Substance Use Topics  . Smoking status: Former Smoker    Quit date: 03/18/1959  . Smokeless tobacco: Not on file  . Alcohol Use: 0.6 oz/week    1 Glasses of wine per week   OB History   Grav Para Term Preterm Abortions TAB SAB Ect Mult Living   4 4 4       4      Review of Systems  Constitutional: Negative for fever and fatigue.  HENT: Negative for congestion and drooling.   Eyes: Negative for pain.  Respiratory: Positive for cough (mild). Negative for shortness of breath.   Cardiovascular: Negative for chest pain.  Gastrointestinal: Negative for nausea, vomiting, abdominal pain and diarrhea.  Genitourinary: Negative for dysuria and hematuria.  Musculoskeletal: Negative for back pain, gait problem and neck pain.  Skin: Negative for color change.  Neurological: Positive for headaches. Negative for dizziness.  Hematological: Negative for adenopathy.  Psychiatric/Behavioral: Negative for behavioral problems.  All other systems reviewed and are negative.     Allergies  Codeine  Home Medications   Prior to Admission medications   Medication Sig Start Date End Date Taking? Authorizing Provider  acetaminophen (TYLENOL) 650 MG CR tablet Take 650 mg by mouth every 8 (eight) hours as needed for pain.    Historical Provider, MD  allopurinol (ZYLOPRIM) 300 MG tablet Take 150  mg by mouth daily. Patient uses one half of the tablet.    Historical Provider, MD  cephALEXin (KEFLEX) 500 MG capsule Take 1 capsule (500 mg total) by mouth 4 (four) times daily. 05/28/12   Richarda Blade, MD  chlordiazePOXIDE (LIBRIUM) 10 MG capsule Take 10 mg by mouth 2 (two) times daily as needed. For anxiety     Historical Provider, MD  citalopram (CELEXA) 10 MG tablet Take 10 mg by mouth daily.      Historical Provider, MD  clopidogrel (PLAVIX) 75 MG tablet Take  75 mg by mouth daily.      Historical Provider, MD  diltiazem (CARDIZEM CD) 120 MG 24 hr capsule Take 1 capsule (120 mg total) by mouth 2 (two) times daily. 03/20/11 03/19/12  Leana Gamer, MD  famotidine (PEPCID) 40 MG tablet Take 40 mg by mouth at bedtime as needed. For heartburn/reflux.     Historical Provider, MD  ferrous sulfate 325 (65 FE) MG tablet Take 325 mg by mouth 2 (two) times daily.    Historical Provider, MD  furosemide (LASIX) 20 MG tablet Take 20 mg by mouth daily.      Historical Provider, MD  HYDROcodone-acetaminophen (VICODIN) 5-500 MG per tablet Take 1 tablet by mouth every 6 (six) hours as needed for pain.    Historical Provider, MD  isosorbide mononitrate (IMDUR) 30 MG 24 hr tablet Take 30 mg by mouth daily.      Historical Provider, MD  levothyroxine (SYNTHROID) 75 MCG tablet Take 1 tablet (75 mcg total) by mouth daily. 03/20/11 03/19/12  Leana Gamer, MD  memantine (NAMENDA) 10 MG tablet Take 10 mg by mouth 2 (two) times daily.    Historical Provider, MD  nitroGLYCERIN (NITROSTAT) 0.4 MG SL tablet Place 0.4 mg under the tongue every 5 (five) minutes as needed for chest pain.    Historical Provider, MD  potassium chloride (K-DUR,KLOR-CON) 10 MEQ tablet Take 10 mEq by mouth daily.    Historical Provider, MD  quinapril (ACCUPRIL) 40 MG tablet Take 40 mg by mouth daily.     Historical Provider, MD  simvastatin (ZOCOR) 10 MG tablet Take 10 mg by mouth at bedtime.    Historical Provider, MD  traMADol (ULTRAM) 50 MG tablet Take 50 mg by mouth every 6 (six) hours as needed for pain.     Historical Provider, MD   BP 135/64  Pulse 115  Temp(Src) 98.7 F (37.1 C) (Oral)  Resp 16  SpO2 98% Physical Exam  Nursing note and vitals reviewed. Constitutional: She is oriented to person, place, and time. She appears well-developed and well-nourished.  HENT:  Head: Normocephalic and atraumatic.  Mouth/Throat: Oropharynx is clear and moist. No oropharyngeal exudate.  Mild  anterior cervical lymphadenopathy bilaterally.  Eyes: Conjunctivae and EOM are normal. Pupils are equal, round, and reactive to light.  Neck: Normal range of motion. Neck supple.  Cardiovascular: Normal rate, regular rhythm, normal heart sounds and intact distal pulses.  Exam reveals no gallop and no friction rub.   No murmur heard. Pulmonary/Chest: Effort normal and breath sounds normal. No respiratory distress. She has no wheezes.  Abdominal: Soft. Bowel sounds are normal. There is no tenderness. There is no rebound and no guarding.  Musculoskeletal: Normal range of motion. She exhibits no edema and no tenderness.  Neurological: She is alert and oriented to person, place, and time.  alert, oriented x3 speech: normal in context and clarity memory: intact grossly cranial nerves II-XII: intact motor strength: full  proximally and distally no involuntary movements or tremors sensation: intact to light touch diffusely  cerebellar: finger-to-nose and heel-to-shin intact gait: normal forwards and backwards   Skin: Skin is warm and dry.  Psychiatric: She has a normal mood and affect. Her behavior is normal.    ED Course  Procedures (including critical care time) Labs Review Labs Reviewed  COMPREHENSIVE METABOLIC PANEL - Abnormal; Notable for the following:    Sodium 132 (*)    Chloride 90 (*)    Glucose, Bld 102 (*)    BUN 28 (*)    GFR calc non Af Amer 48 (*)    GFR calc Af Amer 56 (*)    All other components within normal limits  URINALYSIS, ROUTINE W REFLEX MICROSCOPIC - Abnormal; Notable for the following:    Leukocytes, UA SMALL (*)    All other components within normal limits  URINE MICROSCOPIC-ADD ON - Abnormal; Notable for the following:    Casts HYALINE CASTS (*)    All other components within normal limits  CBC WITH DIFFERENTIAL    Imaging Review Dg Chest 2 View  11/28/2013   CLINICAL DATA:  Right-sided headache.  Hypertension.  EXAM: CHEST  2 VIEW  COMPARISON:   05/28/2012  FINDINGS: Mild hyperinflation. Osteopenia. Patient rotated right. Normal heart size and mediastinal contours. No pleural effusion or pneumothorax. Bibasilar scarring or atelectasis. No lobar consolidation  IMPRESSION: No acute cardiopulmonary disease.   Electronically Signed   By: Abigail Miyamoto M.D.   On: 11/28/2013 18:39   Ct Head Wo Contrast  11/28/2013   CLINICAL DATA:  Right frontal headache.  No reported injury.  EXAM: CT HEAD WITHOUT CONTRAST  TECHNIQUE: Contiguous axial images were obtained from the base of the skull through the vertex without intravenous contrast.  COMPARISON:  08/29/2011.  FINDINGS: Diffusely enlarged ventricles and subarachnoid spaces. Patchy white matter low density in both cerebral hemispheres. No intracranial hemorrhage, mass lesion or CT evidence of acute infarction. Unremarkable bones and paranasal sinuses.  IMPRESSION: 1. No acute abnormality. 2. Stable atrophy and mild chronic small vessel white matter ischemic changes.   Electronically Signed   By: Enrique Sack M.D.   On: 11/28/2013 18:36     EKG Interpretation   Date/Time:  Wednesday November 28 2013 17:41:39 EDT Ventricular Rate:  98 PR Interval:    QRS Duration: 68 QT Interval:  481 QTC Calculation: 614 R Axis:   68 Text Interpretation:  Atrial fibrillation Ventricular premature complex  Low voltage, precordial leads Borderline repolarization abnormality  Prolonged QT interval no other significant change Confirmed by Yuleni Burich   MD, Nikoloz Huy ([id number]) on 11/28/2013 7:06:15 PM      MDM   Final diagnoses:  Headache, unspecified headache type    6:16 PM 80 y.o. female w hx of dementia, HTN, copd, CKD, CAD s/p PCI who presents with headache. EMS reports that she was complaining of a sore throat. The family states that she told the staff at her facility that she felt like she was going to die. The family reports that she attempted to stand at her facility but fell backwards into the chair. He or she  complains of a temporal headache bilaterally. She denies any chest pain, shortness of breath, vomiting, diarrhea. She is afebrile and mildly tachycardic. She appears well on exam and has a normal neurologic exam. She has some baseline dementia and is alert and oriented x2. Will get screening labs and imaging. Centor score of 0.   10:19 PM: I  interpreted/reviewed the labs and/or imaging which were non-contributory. HA gone. BP slightly soft but stable and do not think underlying infectious source.  I have discussed the diagnosis/risks/treatment options with the patient and family and believe the pt to be eligible for discharge home to follow-up with pcp as needed. We also discussed returning to the ED immediately if new or worsening sx occur. We discussed the sx which are most concerning (e.g., worsening HA, fever) that necessitate immediate return. Medications administered to the patient during their visit and any new prescriptions provided to the patient are listed below.  Medications given during this visit Medications  sodium chloride 0.9 % bolus 500 mL (0 mLs Intravenous Stopped 11/28/13 1945)  acetaminophen (TYLENOL) tablet 650 mg (650 mg Oral Given 11/28/13 1836)  sodium chloride 0.9 % bolus 500 mL (0 mLs Intravenous Stopped 11/28/13 2130)  loratadine (CLARITIN) tablet 10 mg (10 mg Oral Given 11/28/13 2210)    New Prescriptions   No medications on file     Pamella Pert, MD 11/28/13 2340

## 2013-11-28 NOTE — ED Notes (Signed)
PER EMS - pt from [place] with c/o sore throat x1 hr.  Pt with dementia, at baseline.  Denies complaints.  Skin PWD.  Awake, alert.

## 2013-12-01 ENCOUNTER — Emergency Department (HOSPITAL_COMMUNITY)
Admission: EM | Admit: 2013-12-01 | Discharge: 2013-12-01 | Disposition: A | Discharge status: Home / Self Care | Payer: Medicare Other | Attending: Emergency Medicine | Admitting: Emergency Medicine

## 2013-12-01 ENCOUNTER — Encounter (HOSPITAL_COMMUNITY): Payer: Self-pay | Admitting: Emergency Medicine

## 2013-12-01 ENCOUNTER — Emergency Department (HOSPITAL_COMMUNITY): Payer: Medicare Other

## 2013-12-01 DIAGNOSIS — I129 Hypertensive chronic kidney disease with stage 1 through stage 4 chronic kidney disease, or unspecified chronic kidney disease: Secondary | ICD-10-CM | POA: Insufficient documentation

## 2013-12-01 DIAGNOSIS — Z9861 Coronary angioplasty status: Secondary | ICD-10-CM | POA: Insufficient documentation

## 2013-12-01 DIAGNOSIS — J449 Chronic obstructive pulmonary disease, unspecified: Secondary | ICD-10-CM | POA: Diagnosis not present

## 2013-12-01 DIAGNOSIS — F0391 Unspecified dementia with behavioral disturbance: Secondary | ICD-10-CM | POA: Diagnosis not present

## 2013-12-01 DIAGNOSIS — Z8719 Personal history of other diseases of the digestive system: Secondary | ICD-10-CM | POA: Insufficient documentation

## 2013-12-01 DIAGNOSIS — Z87440 Personal history of urinary (tract) infections: Secondary | ICD-10-CM | POA: Insufficient documentation

## 2013-12-01 DIAGNOSIS — Z7902 Long term (current) use of antithrombotics/antiplatelets: Secondary | ICD-10-CM | POA: Insufficient documentation

## 2013-12-01 DIAGNOSIS — F411 Generalized anxiety disorder: Secondary | ICD-10-CM | POA: Diagnosis not present

## 2013-12-01 DIAGNOSIS — J4489 Other specified chronic obstructive pulmonary disease: Secondary | ICD-10-CM | POA: Insufficient documentation

## 2013-12-01 DIAGNOSIS — E039 Hypothyroidism, unspecified: Secondary | ICD-10-CM | POA: Insufficient documentation

## 2013-12-01 DIAGNOSIS — Z87891 Personal history of nicotine dependence: Secondary | ICD-10-CM | POA: Insufficient documentation

## 2013-12-01 DIAGNOSIS — N183 Chronic kidney disease, stage 3 unspecified: Secondary | ICD-10-CM | POA: Insufficient documentation

## 2013-12-01 DIAGNOSIS — I2510 Atherosclerotic heart disease of native coronary artery without angina pectoris: Secondary | ICD-10-CM | POA: Insufficient documentation

## 2013-12-01 DIAGNOSIS — F03918 Unspecified dementia, unspecified severity, with other behavioral disturbance: Secondary | ICD-10-CM | POA: Diagnosis not present

## 2013-12-01 DIAGNOSIS — E785 Hyperlipidemia, unspecified: Secondary | ICD-10-CM | POA: Diagnosis not present

## 2013-12-01 DIAGNOSIS — M542 Cervicalgia: Secondary | ICD-10-CM | POA: Diagnosis present

## 2013-12-01 DIAGNOSIS — Z79899 Other long term (current) drug therapy: Secondary | ICD-10-CM | POA: Insufficient documentation

## 2013-12-01 LAB — URINALYSIS, ROUTINE W REFLEX MICROSCOPIC
BILIRUBIN URINE: NEGATIVE
Glucose, UA: NEGATIVE mg/dL
Hgb urine dipstick: NEGATIVE
KETONES UR: NEGATIVE mg/dL
LEUKOCYTES UA: NEGATIVE
NITRITE: NEGATIVE
PROTEIN: NEGATIVE mg/dL
Specific Gravity, Urine: 1.01 (ref 1.005–1.030)
UROBILINOGEN UA: 0.2 mg/dL (ref 0.0–1.0)
pH: 6 (ref 5.0–8.0)

## 2013-12-01 LAB — TROPONIN I: Troponin I: <0.3 ng/mL (ref <0.30)

## 2013-12-01 LAB — CBC WITH DIFFERENTIAL/PLATELET
Basophils Absolute: 0 10*3/uL (ref 0.0–0.1)
Basophils Relative: 0 % (ref 0–1)
Eosinophils Absolute: 0.1 10*3/uL (ref 0.0–0.7)
Eosinophils Relative: 1 % (ref 0–5)
HEMATOCRIT: 38.2 % (ref 36.0–46.0)
HEMOGLOBIN: 12.8 g/dL (ref 12.0–15.0)
LYMPHS ABS: 2.1 10*3/uL (ref 0.7–4.0)
Lymphocytes Relative: 25 % (ref 12–46)
MCH: 27.7 pg (ref 26.0–34.0)
MCHC: 33.5 g/dL (ref 30.0–36.0)
MCV: 82.7 fL (ref 78.0–100.0)
MONOS PCT: 10 % (ref 3–12)
Monocytes Absolute: 0.8 10*3/uL (ref 0.1–1.0)
NEUTROS ABS: 5.3 10*3/uL (ref 1.7–7.7)
Neutrophils Relative %: 64 % (ref 43–77)
Platelets: 204 10*3/uL (ref 150–400)
RBC: 4.62 MIL/uL (ref 3.87–5.11)
RDW: 13.8 % (ref 11.5–15.5)
WBC: 8.3 10*3/uL (ref 4.0–10.5)

## 2013-12-01 LAB — COMPREHENSIVE METABOLIC PANEL
ALK PHOS: 106 U/L (ref 39–117)
ALT: 10 U/L (ref 0–35)
ANION GAP: 15 (ref 5–15)
AST: 16 U/L (ref 0–37)
Albumin: 3.9 g/dL (ref 3.5–5.2)
BILIRUBIN TOTAL: 0.5 mg/dL (ref 0.3–1.2)
BUN: 27 mg/dL — AB (ref 6–23)
CHLORIDE: 94 meq/L — AB (ref 96–112)
CO2: 28 mEq/L (ref 19–32)
Calcium: 9.7 mg/dL (ref 8.4–10.5)
Creatinine, Ser: 0.94 mg/dL (ref 0.50–1.10)
GFR, EST AFRICAN AMERICAN: 62 mL/min — AB (ref >90)
GFR, EST NON AFRICAN AMERICAN: 54 mL/min — AB (ref >90)
GLUCOSE: 107 mg/dL — AB (ref 70–99)
Potassium: 3.9 mEq/L (ref 3.7–5.3)
Sodium: 137 mEq/L (ref 137–147)
Total Protein: 7.1 g/dL (ref 6.0–8.3)

## 2013-12-01 NOTE — ED Provider Notes (Signed)
CSN: [id number]     Arrival date & time 12/01/13  1630 History   First MD Initiated Contact with Patient 12/01/13 1656     Chief Complaint  Patient presents with  . Neck Pain  . Fatigue     (Consider location/radiation/quality/duration/timing/severity/associated sxs/prior Treatment) HPI Comments: Patient with a history of Alzheimer's, presents to the emergency department with chief complaint of altered mental status. Patient states that memory care of [facility]. Reportedly, the patient was found with her head on the table, and was not responding to people. Patient states that "it felt like someone pushed be from the back onto the table." She is accompanied by her daughter-in-law, who states that the patient is not behaving appropriately. History is difficult to elicit secondary to dementia. The patient does complain of a headache. She denies chest pain, shortness of breath, vomiting, diarrhea, or dysuria. She states that she has a fever and feels hot, however her temperature is 97.8.  The history is provided by the patient. No language interpreter was used.    Past Medical History  Diagnosis Date  . Alzheimer disease prolapsed bladder  . Atrial fibrillation   . Anxiety   . Hypertension   . COPD (chronic obstructive pulmonary disease)   . Hx: UTI (urinary tract infection)   . Chronic kidney disease, stage III (moderate)   . GERD (gastroesophageal reflux disease)   . Dyslipidemia   . Hypothyroid   . CAD (coronary artery disease), native coronary artery     H/O PTCA, balloon angioplasty Mid LAD in 2001, 4.0x18 vision in proximal RCA 3.2010 and cardiac cath 01/2009 revealed patent PCI sites  . H/O: GI bleed     Massive rectal bleed 2008. Not a coumadin candidate. A. Fib without recurrence.     Past Surgical History  Procedure Laterality Date  . Coronary angioplasty with stent placement  bladder tacking  . Myocardial infarct    . Total vaginal hysterectomy     History reviewed.  No pertinent family history. History  Substance Use Topics  . Smoking status: Former Smoker    Quit date: 03/18/1959  . Smokeless tobacco: Not on file  . Alcohol Use: 0.6 oz/week    1 Glasses of wine per week   OB History   Grav Para Term Preterm Abortions TAB SAB Ect Mult Living   4 4 4       4      Review of Systems  Constitutional: Negative for fever and chills.  Respiratory: Negative for shortness of breath.   Cardiovascular: Negative for chest pain.  Gastrointestinal: Negative for nausea, vomiting, diarrhea and constipation.  Genitourinary: Negative for dysuria.  All other systems reviewed and are negative.     Allergies  Codeine and Tuberculin tests  Home Medications   Prior to Admission medications   Medication Sig Start Date End Date Taking? Authorizing Provider  acetaminophen (TYLENOL) 650 MG CR tablet Take 650 mg by mouth every 8 (eight) hours as needed for pain.   Yes Historical Provider, MD  alum & mag hydroxide-simeth (MAALOX/MYLANTA) 200-200-20 MG/5ML suspension Take 30 mLs by mouth every 6 (six) hours as needed for indigestion or heartburn.   Yes Historical Provider, MD  cholecalciferol (VITAMIN D) 1000 UNITS tablet Take 5,000 Units by mouth daily.   Yes Historical Provider, MD  citalopram (CELEXA) 10 MG tablet Take 10 mg by mouth daily.     Yes Historical Provider, MD  clopidogrel (PLAVIX) 75 MG tablet Take 75 mg by mouth daily.  Yes Historical Provider, MD  diltiazem (DILACOR XR) 120 MG 24 hr capsule Take 120 mg by mouth daily.   Yes Historical Provider, MD  furosemide (LASIX) 20 MG tablet Take 20 mg by mouth daily.   Yes Historical Provider, MD  guaifenesin (ROBITUSSIN) 100 MG/5ML syrup Take 200 mg by mouth 3 (three) times daily as needed for cough.   Yes Historical Provider, MD  HYDROcodone-acetaminophen (NORCO/VICODIN) 5-325 MG per tablet Take 1 tablet by mouth every 4 (four) hours as needed for moderate pain.   Yes Historical Provider, MD  hydrocortisone  cream 1 % Apply 1 application topically 2 (two) times daily as needed for itching.   Yes Historical Provider, MD  isosorbide mononitrate (IMDUR) 30 MG 24 hr tablet Take 30 mg by mouth daily.     Yes Historical Provider, MD  levothyroxine (SYNTHROID, LEVOTHROID) 50 MCG tablet Take 50 mcg by mouth daily before breakfast.   Yes Historical Provider, MD  loperamide (IMODIUM) 2 MG capsule Take 2 mg by mouth as needed for diarrhea or loose stools.   Yes Historical Provider, MD  magnesium hydroxide (MILK OF MAGNESIA) 400 MG/5ML suspension Take by mouth daily as needed for mild constipation.   Yes Historical Provider, MD  Memantine HCl ER (NAMENDA XR) 28 MG CP24 Take 28 mg by mouth daily.   Yes Historical Provider, MD  neomycin-bacitracin-polymyxin (NEOSPORIN) ointment Apply 1 application topically every 12 (twelve) hours. As needed for skin tears   Yes Historical Provider, MD  nitroGLYCERIN (NITROSTAT) 0.4 MG SL tablet Place 0.4 mg under the tongue every 5 (five) minutes as needed for chest pain.   Yes Historical Provider, MD  potassium chloride (K-DUR,KLOR-CON) 10 MEQ tablet Take 10 mEq by mouth daily.   Yes Historical Provider, MD  protein supplement (RESOURCE BENEPROTEIN) POWD Take 1 scoop by mouth daily.    Yes Historical Provider, MD  quinapril (ACCUPRIL) 40 MG tablet Take 40 mg by mouth daily.    Yes Historical Provider, MD  traMADol (ULTRAM) 50 MG tablet Take 50 mg by mouth every 6 (six) hours as needed for pain.    Yes Historical Provider, MD   BP 129/92  Pulse 85  Temp(Src) 97.8 F (36.6 C) (Oral)  Resp 20  Ht 5\' 3"  (1.6 m)  SpO2 97% Physical Exam  Nursing note and vitals reviewed. Constitutional: She is oriented to person, place, and time. She appears well-developed and well-nourished.  HENT:  Head: Normocephalic and atraumatic.  Eyes: Conjunctivae and EOM are normal. Pupils are equal, round, and reactive to light.  Neck: Normal range of motion. Neck supple.  Cardiovascular: Normal rate  and regular rhythm.  Exam reveals no gallop and no friction rub.   No murmur heard. Pulmonary/Chest: Effort normal and breath sounds normal. No respiratory distress. She has no wheezes. She has no rales. She exhibits no tenderness.  Abdominal: Soft. Bowel sounds are normal. She exhibits no distension and no mass. There is no tenderness. There is no rebound and no guarding.  Musculoskeletal: Normal range of motion. She exhibits no edema and no tenderness.  Neurological: She is alert and oriented to person, place, and time.  CN 3-12 intact, sensation and strength intact  Skin: Skin is warm and dry.  Psychiatric: She has a normal mood and affect. Her behavior is normal. Judgment and thought content normal.    ED Course  Procedures (including critical care time) Results for orders placed during the hospital encounter of 12/01/13  CBC WITH DIFFERENTIAL  Result Value Ref Range   WBC 8.3  4.0 - 10.5 K/uL   RBC 4.62  3.87 - 5.11 MIL/uL   Hemoglobin 12.8  12.0 - 15.0 g/dL   HCT 38.2  36.0 - 46.0 %   MCV 82.7  78.0 - 100.0 fL   MCH 27.7  26.0 - 34.0 pg   MCHC 33.5  30.0 - 36.0 g/dL   RDW 13.8  11.5 - 15.5 %   Platelets 204  150 - 400 K/uL   Neutrophils Relative % 64  43 - 77 %   Neutro Abs 5.3  1.7 - 7.7 K/uL   Lymphocytes Relative 25  12 - 46 %   Lymphs Abs 2.1  0.7 - 4.0 K/uL   Monocytes Relative 10  3 - 12 %   Monocytes Absolute 0.8  0.1 - 1.0 K/uL   Eosinophils Relative 1  0 - 5 %   Eosinophils Absolute 0.1  0.0 - 0.7 K/uL   Basophils Relative 0  0 - 1 %   Basophils Absolute 0.0  0.0 - 0.1 K/uL  COMPREHENSIVE METABOLIC PANEL      Result Value Ref Range   Sodium 137  137 - 147 mEq/L   Potassium 3.9  3.7 - 5.3 mEq/L   Chloride 94 (*) 96 - 112 mEq/L   CO2 28  19 - 32 mEq/L   Glucose, Bld 107 (*) 70 - 99 mg/dL   BUN 27 (*) 6 - 23 mg/dL   Creatinine, Ser 0.94  0.50 - 1.10 mg/dL   Calcium 9.7  8.4 - 10.5 mg/dL   Total Protein 7.1  6.0 - 8.3 g/dL   Albumin 3.9  3.5 - 5.2 g/dL    AST 16  0 - 37 U/L   ALT 10  0 - 35 U/L   Alkaline Phosphatase 106  39 - 117 U/L   Total Bilirubin 0.5  0.3 - 1.2 mg/dL   GFR calc non Af Amer 54 (*) >90 mL/min   GFR calc Af Amer 62 (*) >90 mL/min   Anion gap 15  5 - 15  URINALYSIS, ROUTINE W REFLEX MICROSCOPIC      Result Value Ref Range   Color, Urine YELLOW  YELLOW   APPearance CLEAR  CLEAR   Specific Gravity, Urine 1.010  1.005 - 1.030   pH 6.0  5.0 - 8.0   Glucose, UA NEGATIVE  NEGATIVE mg/dL   Hgb urine dipstick NEGATIVE  NEGATIVE   Bilirubin Urine NEGATIVE  NEGATIVE   Ketones, ur NEGATIVE  NEGATIVE mg/dL   Protein, ur NEGATIVE  NEGATIVE mg/dL   Urobilinogen, UA 0.2  0.0 - 1.0 mg/dL   Nitrite NEGATIVE  NEGATIVE   Leukocytes, UA NEGATIVE  NEGATIVE  TROPONIN I      Result Value Ref Range   Troponin I <0.30  <0.30 ng/mL   Dg Chest 2 View  12/01/2013   CLINICAL DATA:  Chest pain.  EXAM: CHEST  2 VIEW  COMPARISON:  11/28/2013.  FINDINGS: Mediastinum and hilar structures normal. Heart size normal. Mild basilar atelectasis. No pleural effusion or pneumothorax. Degenerative changes thoracic spine. No acute bony abnormality.  IMPRESSION: 1. Lung volumes with mild basilar atelectasis and/or scarring. 2. No acute cardiopulmonary disease otherwise noted. 3. Degenerative changes thoracic spine.   Electronically Signed   By: Marcello Moores  Register   On: 12/01/2013 17:54   Dg Chest 2 View  11/28/2013   CLINICAL DATA:  Right-sided headache.  Hypertension.  EXAM: CHEST  2  VIEW  COMPARISON:  05/28/2012  FINDINGS: Mild hyperinflation. Osteopenia. Patient rotated right. Normal heart size and mediastinal contours. No pleural effusion or pneumothorax. Bibasilar scarring or atelectasis. No lobar consolidation  IMPRESSION: No acute cardiopulmonary disease.   Electronically Signed   By: Abigail Miyamoto M.D.   On: 11/28/2013 18:39   Ct Head Wo Contrast  12/01/2013   CLINICAL DATA:  Headache and fatigue. Increased lethargy. Pain on the right side of the neck.   EXAM: CT HEAD WITHOUT CONTRAST  TECHNIQUE: Contiguous axial images were obtained from the base of the skull through the vertex without intravenous contrast.  COMPARISON:  CT head without contrast 11/28/2013  FINDINGS: Moderate atrophy and white matter disease is stable. No acute cortical infarct, hemorrhage, or mass lesion is present. The ventricles are of normal size. No significant extra-axial fluid collection is present.  The paranasal sinuses and mastoid air cells are clear. The osseous skull is intact.  IMPRESSION: 1. No acute intracranial abnormality or significant interval change. 2. Stable atrophy and white matter disease.   Electronically Signed   By: Lawrence Santiago M.D.   On: 12/01/2013 17:53   Ct Head Wo Contrast  11/28/2013   CLINICAL DATA:  Right frontal headache.  No reported injury.  EXAM: CT HEAD WITHOUT CONTRAST  TECHNIQUE: Contiguous axial images were obtained from the base of the skull through the vertex without intravenous contrast.  COMPARISON:  08/29/2011.  FINDINGS: Diffusely enlarged ventricles and subarachnoid spaces. Patchy white matter low density in both cerebral hemispheres. No intracranial hemorrhage, mass lesion or CT evidence of acute infarction. Unremarkable bones and paranasal sinuses.  IMPRESSION: 1. No acute abnormality. 2. Stable atrophy and mild chronic small vessel white matter ischemic changes.   Electronically Signed   By: Enrique Sack M.D.   On: 11/28/2013 18:36      EKG Interpretation   Date/Time:  Saturday December 01 2013 17:20:50 EDT Ventricular Rate:  112 PR Interval:    QRS Duration: 73 QT Interval:  378 QTC Calculation: 516 R Axis:   73 Text Interpretation:  Atrial fibrillation Low voltage, precordial leads  Borderline T abnormalities, diffuse leads Prolonged QT interval Baseline  wander in lead(s) II III aVF V1 No significant change since last tracing  Confirmed by ALLEN  MD, ANTHONY ([id number]) on 12/01/2013 6:29:31 PM      MDM   Final diagnoses:   Dementia, with behavioral disturbance    Patient with dementia, reportedly, not acting like herself.  Labs, EKG, chest x-ray, UA, and CT are negative. Will discharge to home.  Patient seen by and discussed with Dr. Zenia Resides.  Patient is not in any acute distress.    Montine Circle, PA-C 12/01/13 1858

## 2013-12-01 NOTE — Discharge Instructions (Signed)

## 2013-12-01 NOTE — ED Notes (Addendum)
Per EMS, pt from memory care of [facility].  According to staff, pt more lethargic than usual.  Pt c/o of pain in right side of neck.  No record of falls or trauma.  VS 122/84, HR 70-80, O2 97, CBG 157.    Per pt, she has not been feeling more tired than usual.  Pt c/o head pain, right side of head and neck.  States it feels "hot".  Pt states it hurts with movement.  NAD, respirations equal and unlabored, skin warm and dry

## 2013-12-01 NOTE — ED Provider Notes (Signed)
Medical screening examination/treatment/procedure(s) were conducted as a shared visit with non-physician practitioner(s) and myself.  I personally evaluated the patient during the encounter.   EKG Interpretation   Date/Time:  Saturday December 01 2013 17:20:50 EDT Ventricular Rate:  112 PR Interval:    QRS Duration: 73 QT Interval:  378 QTC Calculation: 516 R Axis:   73 Text Interpretation:  Atrial fibrillation Low voltage, precordial leads  Borderline T abnormalities, diffuse leads Prolonged QT interval Baseline  wander in lead(s) II III aVF V1 No significant change since last tracing  Confirmed by Meha Vidrine  MD, Cordarrel Stiefel ([id number]) on 12/01/2013 6:29:31 PM     Pt with neck pain, atraumatic and located in the rest of the neck. No visible evidence of common. Work appears been reassuring and she is stable for discharge.  Leota Jacobsen, MD 12/01/13 [phone]
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
## 2013-12-01 NOTE — ED Notes (Signed)
In and out done by [place] and I

## 2013-12-04 NOTE — ED Provider Notes (Signed)
Medical screening examination/treatment/procedure(s) were conducted as a shared visit with non-physician practitioner(s) and myself.  I personally evaluated the patient during the encounter.   EKG Interpretation   Date/Time:  Saturday December 01 2013 17:20:50 EDT Ventricular Rate:  112 PR Interval:    QRS Duration: 73 QT Interval:  378 QTC Calculation: 516 R Axis:   73 Text Interpretation:  Atrial fibrillation Low voltage, precordial leads  Borderline T abnormalities, diffuse leads Prolonged QT interval Baseline  wander in lead(s) II III aVF V1 No significant change since last tracing  Confirmed by Zenia Resides  MD, Tyan Dy ([id number]) on 12/01/2013 6:29:31 PM       Leota Jacobsen, MD 12/04/13 1729

## 2014-01-06 ENCOUNTER — Emergency Department (HOSPITAL_COMMUNITY): Payer: Medicare Other

## 2014-01-06 ENCOUNTER — Emergency Department (HOSPITAL_COMMUNITY)
Admission: EM | Admit: 2014-01-06 | Discharge: 2014-01-06 | Disposition: A | Discharge status: Home / Self Care | Payer: Medicare Other | Attending: Emergency Medicine | Admitting: Emergency Medicine

## 2014-01-06 ENCOUNTER — Encounter (HOSPITAL_COMMUNITY): Payer: Self-pay | Admitting: Emergency Medicine

## 2014-01-06 DIAGNOSIS — K219 Gastro-esophageal reflux disease without esophagitis: Secondary | ICD-10-CM | POA: Diagnosis not present

## 2014-01-06 DIAGNOSIS — F419 Anxiety disorder, unspecified: Secondary | ICD-10-CM | POA: Insufficient documentation

## 2014-01-06 DIAGNOSIS — S5012XA Contusion of left forearm, initial encounter: Secondary | ICD-10-CM | POA: Diagnosis not present

## 2014-01-06 DIAGNOSIS — S59912A Unspecified injury of left forearm, initial encounter: Secondary | ICD-10-CM | POA: Diagnosis present

## 2014-01-06 DIAGNOSIS — Z87891 Personal history of nicotine dependence: Secondary | ICD-10-CM | POA: Insufficient documentation

## 2014-01-06 DIAGNOSIS — I4891 Unspecified atrial fibrillation: Secondary | ICD-10-CM | POA: Insufficient documentation

## 2014-01-06 DIAGNOSIS — Z79899 Other long term (current) drug therapy: Secondary | ICD-10-CM | POA: Insufficient documentation

## 2014-01-06 DIAGNOSIS — E785 Hyperlipidemia, unspecified: Secondary | ICD-10-CM | POA: Diagnosis not present

## 2014-01-06 DIAGNOSIS — E039 Hypothyroidism, unspecified: Secondary | ICD-10-CM | POA: Insufficient documentation

## 2014-01-06 DIAGNOSIS — F0280 Dementia in other diseases classified elsewhere without behavioral disturbance: Secondary | ICD-10-CM | POA: Diagnosis not present

## 2014-01-06 DIAGNOSIS — Z87440 Personal history of urinary (tract) infections: Secondary | ICD-10-CM | POA: Diagnosis not present

## 2014-01-06 DIAGNOSIS — I2510 Atherosclerotic heart disease of native coronary artery without angina pectoris: Secondary | ICD-10-CM | POA: Diagnosis not present

## 2014-01-06 DIAGNOSIS — I129 Hypertensive chronic kidney disease with stage 1 through stage 4 chronic kidney disease, or unspecified chronic kidney disease: Secondary | ICD-10-CM | POA: Insufficient documentation

## 2014-01-06 DIAGNOSIS — Z7902 Long term (current) use of antithrombotics/antiplatelets: Secondary | ICD-10-CM | POA: Diagnosis not present

## 2014-01-06 DIAGNOSIS — Z7952 Long term (current) use of systemic steroids: Secondary | ICD-10-CM | POA: Diagnosis not present

## 2014-01-06 DIAGNOSIS — Z9861 Coronary angioplasty status: Secondary | ICD-10-CM | POA: Diagnosis not present

## 2014-01-06 DIAGNOSIS — N183 Chronic kidney disease, stage 3 (moderate): Secondary | ICD-10-CM | POA: Insufficient documentation

## 2014-01-06 DIAGNOSIS — J449 Chronic obstructive pulmonary disease, unspecified: Secondary | ICD-10-CM | POA: Diagnosis not present

## 2014-01-06 DIAGNOSIS — G300 Alzheimer's disease with early onset: Secondary | ICD-10-CM | POA: Diagnosis not present

## 2014-01-06 NOTE — ED Notes (Signed)
[facility] called  
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
## 2014-01-06 NOTE — Discharge Instructions (Signed)
Contusion °A contusion is a deep bruise. Contusions are the result of an injury that caused bleeding under the skin. The contusion may turn blue, purple, or yellow. Minor injuries will give you a painless contusion, but more severe contusions may stay painful and swollen for a few weeks.  °CAUSES  °A contusion is usually caused by a blow, trauma, or direct force to an area of the body. °SYMPTOMS  °· Swelling and redness of the injured area. °· Bruising of the injured area. °· Tenderness and soreness of the injured area. °· Pain. °DIAGNOSIS  °The diagnosis can be made by taking a history and physical exam. An X-ray, CT scan, or MRI may be needed to determine if there were any associated injuries, such as fractures. °TREATMENT  °Specific treatment will depend on what area of the body was injured. In general, the best treatment for a contusion is resting, icing, elevating, and applying cold compresses to the injured area. Over-the-counter medicines may also be recommended for pain control. Ask your caregiver what the best treatment is for your contusion. °HOME CARE INSTRUCTIONS  °· Put ice on the injured area. °¨ Put ice in a plastic bag. °¨ Place a towel between your skin and the bag. °¨ Leave the ice on for 15-20 minutes, 3-4 times a day, or as directed by your health care provider. °· Only take over-the-counter or prescription medicines for pain, discomfort, or fever as directed by your caregiver. Your caregiver may recommend avoiding anti-inflammatory medicines (aspirin, ibuprofen, and naproxen) for 48 hours because these medicines may increase bruising. °· Rest the injured area. °· If possible, elevate the injured area to reduce swelling. °SEEK IMMEDIATE MEDICAL CARE IF:  °· You have increased bruising or swelling. °· You have pain that is getting worse. °· Your swelling or pain is not relieved with medicines. °MAKE SURE YOU:  °· Understand these instructions. °· Will watch your condition. °· Will get help right  away if you are not doing well or get worse. °Document Released: 12/30/2004 Document Revised: 03/27/2013 Document Reviewed: 01/25/2011 °[employer]® Patient Information ©2015 [employer]. This information is not intended to replace advice given to you by your health care provider. Make sure you discuss any questions you have with your health care provider. ° °

## 2014-01-06 NOTE — ED Notes (Signed)
Attempted to call [facility] again. No answer.

## 2014-02-04 ENCOUNTER — Encounter (HOSPITAL_COMMUNITY): Payer: Self-pay | Admitting: Emergency Medicine

## 2014-04-14 ENCOUNTER — Emergency Department (HOSPITAL_COMMUNITY): Payer: Medicare Other

## 2014-04-14 ENCOUNTER — Encounter (HOSPITAL_COMMUNITY): Payer: Self-pay | Admitting: Emergency Medicine

## 2014-04-14 ENCOUNTER — Emergency Department (HOSPITAL_COMMUNITY)
Admission: EM | Admit: 2014-04-14 | Discharge: 2014-04-14 | Disposition: A | Discharge status: Home / Self Care | Payer: Medicare Other | Attending: Emergency Medicine | Admitting: Emergency Medicine

## 2014-04-14 DIAGNOSIS — J449 Chronic obstructive pulmonary disease, unspecified: Secondary | ICD-10-CM | POA: Diagnosis not present

## 2014-04-14 DIAGNOSIS — F0280 Dementia in other diseases classified elsewhere without behavioral disturbance: Secondary | ICD-10-CM | POA: Insufficient documentation

## 2014-04-14 DIAGNOSIS — Z87891 Personal history of nicotine dependence: Secondary | ICD-10-CM | POA: Insufficient documentation

## 2014-04-14 DIAGNOSIS — I2510 Atherosclerotic heart disease of native coronary artery without angina pectoris: Secondary | ICD-10-CM | POA: Insufficient documentation

## 2014-04-14 DIAGNOSIS — K219 Gastro-esophageal reflux disease without esophagitis: Secondary | ICD-10-CM | POA: Insufficient documentation

## 2014-04-14 DIAGNOSIS — D179 Benign lipomatous neoplasm, unspecified: Secondary | ICD-10-CM

## 2014-04-14 DIAGNOSIS — I129 Hypertensive chronic kidney disease with stage 1 through stage 4 chronic kidney disease, or unspecified chronic kidney disease: Secondary | ICD-10-CM | POA: Insufficient documentation

## 2014-04-14 DIAGNOSIS — Z79899 Other long term (current) drug therapy: Secondary | ICD-10-CM | POA: Diagnosis not present

## 2014-04-14 DIAGNOSIS — Z87440 Personal history of urinary (tract) infections: Secondary | ICD-10-CM | POA: Insufficient documentation

## 2014-04-14 DIAGNOSIS — M25532 Pain in left wrist: Secondary | ICD-10-CM

## 2014-04-14 DIAGNOSIS — Z7902 Long term (current) use of antithrombotics/antiplatelets: Secondary | ICD-10-CM | POA: Diagnosis not present

## 2014-04-14 DIAGNOSIS — I4891 Unspecified atrial fibrillation: Secondary | ICD-10-CM | POA: Diagnosis not present

## 2014-04-14 DIAGNOSIS — Z9861 Coronary angioplasty status: Secondary | ICD-10-CM | POA: Insufficient documentation

## 2014-04-14 DIAGNOSIS — D1779 Benign lipomatous neoplasm of other sites: Secondary | ICD-10-CM | POA: Insufficient documentation

## 2014-04-14 DIAGNOSIS — G309 Alzheimer's disease, unspecified: Secondary | ICD-10-CM | POA: Insufficient documentation

## 2014-04-14 DIAGNOSIS — E785 Hyperlipidemia, unspecified: Secondary | ICD-10-CM | POA: Insufficient documentation

## 2014-04-14 DIAGNOSIS — F419 Anxiety disorder, unspecified: Secondary | ICD-10-CM | POA: Diagnosis not present

## 2014-04-14 DIAGNOSIS — E039 Hypothyroidism, unspecified: Secondary | ICD-10-CM | POA: Insufficient documentation

## 2014-04-14 DIAGNOSIS — N183 Chronic kidney disease, stage 3 (moderate): Secondary | ICD-10-CM | POA: Diagnosis not present

## 2014-04-14 NOTE — Discharge Instructions (Signed)
The prominent area of soft tissue on your wrist is consistent with a lipoma, a benign fatty tumor.  Your x-rays are normal other than arthritis.  If you have fever, or if the area becomes reddened or different in its appearance should be reevaluated.  Wear the splint only as needed for comfort.

## 2014-04-14 NOTE — ED Notes (Signed)
[facility] called and made aware of need to transport patient back to [facility]

## 2014-04-14 NOTE — ED Notes (Signed)
Discharge instructions reviewed with patient--agrees and verbalized understanding Patient informed of need to make and keep follow up appointment--agrees and verbalized understanding VS updated and stable--reviewed with patient at time of DC--agrees and verbalized understanding Patient alert and oriented x 4 and in NAD at time of discharge All questions related to this ED visit, DC instructions and follow up care answered to patient's satisfaction by this nurse 

## 2014-04-14 NOTE — ED Notes (Signed)
[facility] present in ED

## 2014-04-14 NOTE — ED Notes (Signed)
Pt from memory care unit at [facility] Staff called EMS d/t pt complaining of arm pain today. Staff noticed swelling/deformity to left wrist. Staff at home states they are unsure of how long pt has had injury, they are also unsure of how she got it d/t pt memory deficit and inability to remember how she hurt it. Per EMS wrist swollen, but not bruised

## 2014-04-14 NOTE — ED Notes (Signed)
Bed: DX83 Expected date: 04/14/14 Expected time: 5:07 PM Means of arrival: Ambulance Comments: Fall/ ? Wrist deformity

## 2014-04-14 NOTE — ED Provider Notes (Signed)
CSN: [id number]     Arrival date & time 04/14/14  1718 History   First MD Initiated Contact with Patient 04/14/14 1729     Chief Complaint  Patient presents with  . Wrist Pain      HPI  Patient presents for evaluation of her left wrist. She is in a memory care Center. Daily she complained of her arm today. Staff noticed any swelling on her forearm. They're uncertain if she is injured it Parents signs of fall. No witnessed fall or injury she presents here.  Past Medical History  Diagnosis Date  . Alzheimer disease prolapsed bladder  . Atrial fibrillation   . Anxiety   . Hypertension   . COPD (chronic obstructive pulmonary disease)   . Hx: UTI (urinary tract infection)   . Chronic kidney disease, stage III (moderate)   . GERD (gastroesophageal reflux disease)   . Dyslipidemia   . Hypothyroid   . CAD (coronary artery disease), native coronary artery     H/O PTCA, balloon angioplasty Mid LAD in 2001, 4.0x18 vision in proximal RCA 3.2010 and cardiac cath 01/2009 revealed patent PCI sites  . H/O: GI bleed     Massive rectal bleed 2008. Not a coumadin candidate. A. Fib without recurrence.     Past Surgical History  Procedure Laterality Date  . Coronary angioplasty with stent placement  bladder tacking  . Myocardial infarct    . Total vaginal hysterectomy     History reviewed. No pertinent family history. History  Substance Use Topics  . Smoking status: Former Smoker    Quit date: 03/18/1959  . Smokeless tobacco: Not on file  . Alcohol Use: 0.6 oz/week    1 Glasses of wine per week   OB History    Gravida Para Term Preterm AB TAB SAB Ectopic Multiple Living   4 4 4       4      Review of Systems  Unable to perform ROS: Dementia      Allergies  Codeine and Tuberculin tests  Home Medications   Prior to Admission medications   Medication Sig Start Date End Date Taking? Authorizing Provider  acetaminophen (TYLENOL) 650 MG CR tablet Take 650 mg by mouth every 8  (eight) hours as needed for pain (pain).    Yes Historical Provider, MD  ALPRAZolam (XANAX) 0.25 MG tablet Take 0.25 mg by mouth every 8 (eight) hours as needed for anxiety.   Yes Historical Provider, MD  Cholecalciferol (VITAMIN D-3) 5000 UNITS TABS Take 1 tablet by mouth daily.   Yes Historical Provider, MD  citalopram (CELEXA) 10 MG tablet Take 10 mg by mouth daily.     Yes Historical Provider, MD  clopidogrel (PLAVIX) 75 MG tablet Take 75 mg by mouth daily.   Yes Historical Provider, MD  diltiazem (DILACOR XR) 120 MG 24 hr capsule Take 120 mg by mouth daily.   Yes Historical Provider, MD  furosemide (LASIX) 20 MG tablet Take 20 mg by mouth daily.   Yes Historical Provider, MD  HYDROcodone-acetaminophen (NORCO/VICODIN) 5-325 MG per tablet Take 1 tablet by mouth every 4 (four) hours as needed for moderate pain or severe pain.   Yes Historical Provider, MD  isosorbide mononitrate (IMDUR) 30 MG 24 hr tablet Take 30 mg by mouth daily.     Yes Historical Provider, MD  levothyroxine (SYNTHROID, LEVOTHROID) 75 MCG tablet Take 37.5 mcg by mouth daily before breakfast.   Yes Historical Provider, MD  Memantine HCl ER (NAMENDA XR) 28  MG CP24 Take 28 mg by mouth daily.   Yes Historical Provider, MD  potassium chloride (K-DUR,KLOR-CON) 10 MEQ tablet Take 10 mEq by mouth daily.   Yes Historical Provider, MD  protein supplement (RESOURCE BENEPROTEIN) POWD Take 1 scoop by mouth daily.    Yes Historical Provider, MD  quinapril (ACCUPRIL) 40 MG tablet Take 40 mg by mouth daily.    Yes Historical Provider, MD  traMADol (ULTRAM) 50 MG tablet Take 50 mg by mouth every 6 (six) hours as needed for moderate pain or severe pain.   Yes Historical Provider, MD  alum & mag hydroxide-simeth (MAALOX/MYLANTA) 200-200-20 MG/5ML suspension Take 30 mLs by mouth every 6 (six) hours as needed for indigestion or heartburn.    Historical Provider, MD  guaifenesin (ROBITUSSIN) 100 MG/5ML syrup Take 200 mg by mouth every 6 (six) hours  as needed for cough.     Historical Provider, MD  hydrocortisone cream 1 % Apply 1 application topically 2 (two) times daily as needed for itching.    Historical Provider, MD  loperamide (IMODIUM) 2 MG capsule Take 2 mg by mouth as needed for diarrhea or loose stools.    Historical Provider, MD  magnesium hydroxide (MILK OF MAGNESIA) 400 MG/5ML suspension Take 30 mLs by mouth at bedtime as needed for mild constipation.     Historical Provider, MD  neomycin-bacitracin-polymyxin (NEOSPORIN) ointment Apply 1 application topically every 12 (twelve) hours. As needed for skin tears    Historical Provider, MD  nitroGLYCERIN (NITROSTAT) 0.4 MG SL tablet Place 0.4 mg under the tongue every 5 (five) minutes as needed for chest pain.    Historical Provider, MD   BP 110/67 mmHg  Pulse 96  Temp(Src) 98.6 F (37 C) (Oral)  Resp 20  SpO2 96% Physical Exam  Musculoskeletal:       Arms:   ED Course  Procedures (including critical care time) Labs Review Labs Reviewed - No data to display  Imaging Review No results found.   EKG Interpretation None      MDM   Final diagnoses:  None    Area concern on her forearm appears very similar to the rest of the skin or forearm. It is not reddened. It is mildly prominent tenderness not frankly fluctuant it is soft. Bedside limited ultrasound shows moderate tenderness. No abscess. Does not overlie the joint. No pain on manipulation joint. Arthritic appearing fingers. I think that this is a lipoma that is not a new condition and she is partly appropriate for outpatient following unless things change with the appearance of this area.    Tanna Furry, MD 04/14/14 [phone]

## 2014-04-14 NOTE — ED Notes (Signed)
Report called to [facility] at [phone] All questions answered by this nurse Awaiting arrival of [facility]

## 2015-01-09 ENCOUNTER — Emergency Department (HOSPITAL_COMMUNITY): Payer: Medicare Other

## 2015-01-09 ENCOUNTER — Encounter (HOSPITAL_COMMUNITY): Payer: Self-pay | Admitting: Emergency Medicine

## 2015-01-09 ENCOUNTER — Observation Stay (HOSPITAL_COMMUNITY)
Admission: EM | Admit: 2015-01-09 | Discharge: 2015-01-11 | Disposition: A | Discharge status: Skilled Nursing Facility | Payer: Medicare Other | Attending: Internal Medicine | Admitting: Internal Medicine

## 2015-01-09 DIAGNOSIS — N179 Acute kidney failure, unspecified: Secondary | ICD-10-CM | POA: Insufficient documentation

## 2015-01-09 DIAGNOSIS — Z79899 Other long term (current) drug therapy: Secondary | ICD-10-CM | POA: Diagnosis not present

## 2015-01-09 DIAGNOSIS — I129 Hypertensive chronic kidney disease with stage 1 through stage 4 chronic kidney disease, or unspecified chronic kidney disease: Secondary | ICD-10-CM | POA: Diagnosis not present

## 2015-01-09 DIAGNOSIS — E039 Hypothyroidism, unspecified: Secondary | ICD-10-CM | POA: Insufficient documentation

## 2015-01-09 DIAGNOSIS — Z7902 Long term (current) use of antithrombotics/antiplatelets: Secondary | ICD-10-CM | POA: Insufficient documentation

## 2015-01-09 DIAGNOSIS — K219 Gastro-esophageal reflux disease without esophagitis: Secondary | ICD-10-CM | POA: Diagnosis not present

## 2015-01-09 DIAGNOSIS — Z955 Presence of coronary angioplasty implant and graft: Secondary | ICD-10-CM | POA: Diagnosis not present

## 2015-01-09 DIAGNOSIS — G309 Alzheimer's disease, unspecified: Secondary | ICD-10-CM | POA: Insufficient documentation

## 2015-01-09 DIAGNOSIS — I2510 Atherosclerotic heart disease of native coronary artery without angina pectoris: Secondary | ICD-10-CM | POA: Diagnosis not present

## 2015-01-09 DIAGNOSIS — Z87891 Personal history of nicotine dependence: Secondary | ICD-10-CM | POA: Diagnosis not present

## 2015-01-09 DIAGNOSIS — R079 Chest pain, unspecified: Secondary | ICD-10-CM | POA: Diagnosis not present

## 2015-01-09 DIAGNOSIS — Z79891 Long term (current) use of opiate analgesic: Secondary | ICD-10-CM | POA: Diagnosis not present

## 2015-01-09 DIAGNOSIS — R8271 Bacteriuria: Secondary | ICD-10-CM | POA: Diagnosis not present

## 2015-01-09 DIAGNOSIS — E785 Hyperlipidemia, unspecified: Secondary | ICD-10-CM | POA: Diagnosis not present

## 2015-01-09 DIAGNOSIS — I4891 Unspecified atrial fibrillation: Secondary | ICD-10-CM | POA: Diagnosis not present

## 2015-01-09 DIAGNOSIS — R0602 Shortness of breath: Secondary | ICD-10-CM | POA: Diagnosis not present

## 2015-01-09 DIAGNOSIS — J449 Chronic obstructive pulmonary disease, unspecified: Secondary | ICD-10-CM | POA: Diagnosis not present

## 2015-01-09 DIAGNOSIS — F329 Major depressive disorder, single episode, unspecified: Secondary | ICD-10-CM | POA: Insufficient documentation

## 2015-01-09 DIAGNOSIS — F419 Anxiety disorder, unspecified: Secondary | ICD-10-CM | POA: Insufficient documentation

## 2015-01-09 DIAGNOSIS — F0280 Dementia in other diseases classified elsewhere without behavioral disturbance: Secondary | ICD-10-CM | POA: Insufficient documentation

## 2015-01-09 DIAGNOSIS — N183 Chronic kidney disease, stage 3 (moderate): Secondary | ICD-10-CM | POA: Insufficient documentation

## 2015-01-09 LAB — CBC
HCT: 37.8 % (ref 36.0–46.0)
HEMATOCRIT: 37.1 % (ref 36.0–46.0)
HEMOGLOBIN: 12.5 g/dL (ref 12.0–15.0)
Hemoglobin: 12.4 g/dL (ref 12.0–15.0)
MCH: 28.4 pg (ref 26.0–34.0)
MCH: 28.5 pg (ref 26.0–34.0)
MCHC: 33.1 g/dL (ref 30.0–36.0)
MCHC: 33.4 g/dL (ref 30.0–36.0)
MCV: 85.9 fL (ref 78.0–100.0)
MCV: 85.3 fL (ref 78.0–100.0)
Platelets: 212 10*3/uL (ref 150–400)
Platelets: 203 10*3/uL (ref 150–400)
RBC: 4.4 MIL/uL (ref 3.87–5.11)
RBC: 4.35 MIL/uL (ref 3.87–5.11)
RDW: 13.9 % (ref 11.5–15.5)
RDW: 13.7 % (ref 11.5–15.5)
WBC: 7.4 10*3/uL (ref 4.0–10.5)
WBC: 7.4 10*3/uL (ref 4.0–10.5)

## 2015-01-09 LAB — BASIC METABOLIC PANEL
Anion gap: 10 (ref 5–15)
BUN: 26 mg/dL — AB (ref 6–20)
CALCIUM: 9.3 mg/dL (ref 8.9–10.3)
CO2: 26 mmol/L (ref 22–32)
Chloride: 98 mmol/L — ABNORMAL LOW (ref 101–111)
Creatinine, Ser: 1.47 mg/dL — ABNORMAL HIGH (ref 0.44–1.00)
GFR calc Af Amer: 36 mL/min — ABNORMAL LOW (ref >60)
GFR, EST NON AFRICAN AMERICAN: 31 mL/min — AB (ref >60)
GLUCOSE: 113 mg/dL — AB (ref 65–99)
Potassium: 5.1 mmol/L (ref 3.5–5.1)
Sodium: 134 mmol/L — ABNORMAL LOW (ref 135–145)

## 2015-01-09 LAB — CREATININE, SERUM
CREATININE: 1.46 mg/dL — AB (ref 0.44–1.00)
GFR, EST AFRICAN AMERICAN: 36 mL/min — AB (ref >60)
GFR, EST NON AFRICAN AMERICAN: 31 mL/min — AB (ref >60)

## 2015-01-09 LAB — I-STAT TROPONIN, ED: TROPONIN I, POC: 0 ng/mL (ref 0.00–0.08)

## 2015-01-09 LAB — TSH: TSH: 1.552 u[IU]/mL (ref 0.350–4.500)

## 2015-01-09 LAB — TROPONIN I: Troponin I: <0.03 ng/mL (ref <0.031)

## 2015-01-09 MED ORDER — LEVOTHYROXINE SODIUM 75 MCG PO TABS
37.5000 ug | ORAL_TABLET | Freq: Every day | ORAL | Status: DC
  Administered 2015-01-10 – 2015-01-11 (×2): 37.5 ug via ORAL
  Filled 2015-01-09 (×3): qty 1

## 2015-01-09 MED ORDER — ACETAMINOPHEN 650 MG RE SUPP: 650.0000 mg | Freq: Four times a day (QID) | RECTAL | Status: DC | PRN

## 2015-01-09 MED ORDER — CITALOPRAM HYDROBROMIDE 10 MG PO TABS
10.0000 mg | ORAL_TABLET | Freq: Every day | ORAL | Status: DC
  Administered 2015-01-10 – 2015-01-11 (×2): 10 mg via ORAL
  Filled 2015-01-09 (×2): qty 1

## 2015-01-09 MED ORDER — MEMANTINE HCL ER 28 MG PO CP24
28.0000 mg | ORAL_CAPSULE | Freq: Every day | ORAL | Status: DC
  Administered 2015-01-10 – 2015-01-11 (×2): 28 mg via ORAL
  Filled 2015-01-09 (×2): qty 1

## 2015-01-09 MED ORDER — SODIUM CHLORIDE 0.9 % IJ SOLN
3.0000 mL | Freq: Two times a day (BID) | INTRAMUSCULAR | Status: DC
  Administered 2015-01-10 – 2015-01-11 (×2): 3 mL via INTRAVENOUS

## 2015-01-09 MED ORDER — DILTIAZEM HCL ER 120 MG PO CP24
120.0000 mg | ORAL_CAPSULE | Freq: Every day | ORAL | Status: DC
  Filled 2015-01-09 (×2): qty 1

## 2015-01-09 MED ORDER — DOCUSATE SODIUM 100 MG PO CAPS
100.0000 mg | ORAL_CAPSULE | Freq: Every day | ORAL | Status: DC
  Administered 2015-01-10 – 2015-01-11 (×2): 100 mg via ORAL
  Filled 2015-01-09 (×3): qty 1

## 2015-01-09 MED ORDER — ACETAMINOPHEN 325 MG PO TABS
650.0000 mg | ORAL_TABLET | Freq: Four times a day (QID) | ORAL | Status: DC | PRN
  Administered 2015-01-09: 650 mg via ORAL
  Filled 2015-01-09: qty 2

## 2015-01-09 MED ORDER — CLOPIDOGREL BISULFATE 75 MG PO TABS
75.0000 mg | ORAL_TABLET | Freq: Every day | ORAL | Status: DC
  Administered 2015-01-10 – 2015-01-11 (×2): 75 mg via ORAL
  Filled 2015-01-09 (×2): qty 1

## 2015-01-09 MED ORDER — ENOXAPARIN SODIUM 30 MG/0.3ML ~~LOC~~ SOLN
30.0000 mg | SUBCUTANEOUS | Status: DC
  Administered 2015-01-09 – 2015-01-10 (×2): 30 mg via SUBCUTANEOUS
  Filled 2015-01-09 (×2): qty 0.3

## 2015-01-09 MED ORDER — ALUM & MAG HYDROXIDE-SIMETH 200-200-20 MG/5ML PO SUSP: 30.0000 mL | Freq: Four times a day (QID) | ORAL | Status: DC | PRN

## 2015-01-09 MED ORDER — SODIUM CHLORIDE 0.9 % IV SOLN
INTRAVENOUS | Status: AC
  Administered 2015-01-09: 18:00:00 via INTRAVENOUS

## 2015-01-09 NOTE — H&P (Signed)
Date: 01/09/2015               Patient Name:  [name] MRN: [id number]  DOB: 1925/06/19 Age / Sex: 81 y.o., female   PCP: Sande Brothers, MD         Medical Service: Internal Medicine Teaching Service         Attending Physician: Dr. Sid Falcon, MD    First Contact: Dr. Burgess Estelle Pager: [phone]  Second Contact: Dr. Osa Craver Pager: [phone]       After Hours (After 5p/  First Contact Pager: [phone]  weekends / holidays): Second Contact Pager: [phone]   Chief Complaint: racing heart   History of Present Illness:   [name] is an 81 yo woman with history of Alzheimers with mild-moderate dementia, atrial fibrillation on diltiazem, HTN, CAD, CKD3, hypothyroidism, and GERD, who is came from [facility] memory care unit because she had a racing heart with sharp chest pain at 9.30 AM. The pain did not radiate to the arm or jaw.  Pt has a history of afib rate controlled on diltiazem, and said she has had similar episodes in the past when she would have periodic afib and then would resolve. She received her morning dose of diltiazem and noticed this so she called EMS and then she received 2 nitro in the EMS.  She was accompanied by son and daughter who provided most of the history. Family said that her complain of chest pain is very reliable despite the presence of advanced dementia.  In the ER, she was found to be in afib with RVR, and her heart rate ranged from 80- 120s. The first troponin was negative and CXR was unremarkable. Her EKG was showing afib/a flutter v- paced. She received another 2 nitro in the ER, and by the time she got to the ER, her chest pain had resolved.  She denied headache, dizziness, shortness of breath,  n/v/d/c. She denied recent falls. She does not have a pacemaker. She had some stents placed in 2010. Per chart, her last episode of afib was in 2012. She is not on coumadin due to her history of GI bleed.  She did not eat or drink anything  today.   Meds: Current Facility-Administered Medications  Medication Dose Route Frequency Provider Last Rate Last Dose  . 0.9 %  sodium chloride infusion   Intravenous STAT Fredia Sorrow, MD       Current Outpatient Prescriptions  Medication Sig Dispense Refill  . acetaminophen (TYLENOL) 650 MG CR tablet Take 650 mg by mouth every 8 (eight) hours as needed for pain (pain).     Marland Kitchen ALPRAZolam (XANAX) 0.25 MG tablet Take 0.25 mg by mouth every 8 (eight) hours as needed for anxiety.    Marland Kitchen alum & mag hydroxide-simeth (MAALOX/MYLANTA) 200-200-20 MG/5ML suspension Take 30 mLs by mouth every 6 (six) hours as needed for indigestion or heartburn.    . Cholecalciferol (VITAMIN D-3) 5000 UNITS TABS Take 1 tablet by mouth daily.    . citalopram (CELEXA) 10 MG tablet Take 10 mg by mouth daily.      . clopidogrel (PLAVIX) 75 MG tablet Take 75 mg by mouth daily.    Marland Kitchen diltiazem (DILACOR XR) 120 MG 24 hr capsule Take 120 mg by mouth daily.    . furosemide (LASIX) 20 MG tablet Take 20 mg by mouth daily.    Marland Kitchen guaifenesin (ROBITUSSIN) 100 MG/5ML syrup Take 200 mg by mouth every 6 (six) hours  as needed for cough.     Marland Kitchen HYDROcodone-acetaminophen (NORCO/VICODIN) 5-325 MG per tablet Take 1 tablet by mouth every 4 (four) hours as needed for moderate pain or severe pain.    . hydrocortisone cream 1 % Apply 1 application topically 2 (two) times daily as needed for itching.    . isosorbide mononitrate (IMDUR) 30 MG 24 hr tablet Take 30 mg by mouth daily.      Marland Kitchen levothyroxine (SYNTHROID, LEVOTHROID) 75 MCG tablet Take 37.5 mcg by mouth daily before breakfast.    . loperamide (IMODIUM) 2 MG capsule Take 2 mg by mouth as needed for diarrhea or loose stools.    . magnesium hydroxide (MILK OF MAGNESIA) 400 MG/5ML suspension Take 30 mLs by mouth at bedtime as needed for mild constipation.     . Memantine HCl ER (NAMENDA XR) 28 MG CP24 Take 28 mg by mouth daily.    Marland Kitchen neomycin-bacitracin-polymyxin (NEOSPORIN) ointment Apply 1  application topically every 12 (twelve) hours. As needed for skin tears    . nitroGLYCERIN (NITROSTAT) 0.4 MG SL tablet Place 0.4 mg under the tongue every 5 (five) minutes as needed for chest pain.    . potassium chloride (K-DUR,KLOR-CON) 10 MEQ tablet Take 10 mEq by mouth daily.    . protein supplement (RESOURCE BENEPROTEIN) POWD Take 1 scoop by mouth daily.     . quinapril (ACCUPRIL) 40 MG tablet Take 40 mg by mouth daily.     . traMADol (ULTRAM) 50 MG tablet Take 50 mg by mouth every 6 (six) hours as needed for moderate pain or severe pain.      Allergies: Allergies as of 01/09/2015 - Review Complete 01/09/2015  Allergen Reaction Noted  . Codeine Nausea And Vomiting 03/18/2011  . Tuberculin tests  11/28/2013   Past Medical History  Diagnosis Date  . Alzheimer disease prolapsed bladder  . Atrial fibrillation ([place])   . Anxiety   . Hypertension   . COPD (chronic obstructive pulmonary disease) ([place])   . Hx: UTI (urinary tract infection)   . Chronic kidney disease, stage III (moderate)   . GERD (gastroesophageal reflux disease)   . Dyslipidemia   . Hypothyroid   . CAD (coronary artery disease), native coronary artery     H/O PTCA, balloon angioplasty Mid LAD in 2001, 4.0x18 vision in proximal RCA 3.2010 and cardiac cath 01/2009 revealed patent PCI sites  . H/O: GI bleed     Massive rectal bleed 2008. Not a coumadin candidate. A. Fib without recurrence.     Past Surgical History  Procedure Laterality Date  . Coronary angioplasty with stent placement  bladder tacking  . Myocardial infarct    . Total vaginal hysterectomy     No family history on file. Social History   Social History  . Marital Status: Widowed    Spouse Name: N/A  . Number of Children: N/A  . Years of Education: N/A   Occupational History  . Not on file.   Social History Main Topics  . Smoking status: Former Smoker    Quit date: 03/18/1959  . Smokeless tobacco: Not on file  . Alcohol Use: No  . Drug  Use: No  . Sexual Activity: No   Other Topics Concern  . Not on file   Social History Narrative    Review of Systems: ROS: General: no fevers, chills HEENT: no blurry vision, is hard of hearing CV:  Some chest pain, no palpitations Pulm: no dyspnea, coughing, wheezing Abd: no abdominal pain,  nausea/vomiting, diarrhea/constipation GU: no dysuria, hematuria, polyuria Ext: no arthralgias, myalgias Neuro: no weakness, numbness, or tingling Hematological- bruises easily Psyc: has some confusion Skin: no rash   Physical Exam: Blood pressure 96/76, pulse 82, temperature 98.2 F (36.8 C), temperature source Oral, resp. rate 19, height 5\' 3"  (1.6 m), weight 135 lb (61.236 kg), SpO2 98 %. General: alert, oriented to person, place, but not time. HEENT: EOMI, PERRLA, sclera white, conjunctiva pink, no ear/nose/throat erythema Neck: supple, midline trachea CV: irregularly irregular rhythm, no m/r/g, no carotid bruits appreciated, no JVD appreciated Resp: equal and symmetric breath sounds, no wheezing heard Abdomen: soft, nontender, nondistended, +BS in all 4 quadrants, no bruits appreciated GU: no CVA tenderness Skin: warm, dry, intact, has minor petechiae from bruises Extremities: pulses intact b/l, no edema, clubbing or cyanosis, no calf tenderness Neurologic:  No focal neuro deficits , normal muscle tone,  CN II-XII grossly intact Psyc: pleasant personality, affect appropriate to mood, no focal deficits    Lab results: Results for orders placed or performed during the hospital encounter of 01/09/15 (from the past 24 hour(s))  Basic metabolic panel     Status: Abnormal   Collection Time: 01/09/15 11:00 AM  Result Value Ref Range   Sodium 134 (L) 135 - 145 mmol/L   Potassium 5.1 3.5 - 5.1 mmol/L   Chloride 98 (L) 101 - 111 mmol/L   CO2 26 22 - 32 mmol/L   Glucose, Bld 113 (H) 65 - 99 mg/dL   BUN 26 (H) 6 - 20 mg/dL   Creatinine, Ser 1.47 (H) 0.44 - 1.00 mg/dL   Calcium 9.3  8.9 - 10.3 mg/dL   GFR calc non Af Amer 31 (L) >60 mL/min   GFR calc Af Amer 36 (L) >60 mL/min   Anion gap 10 5 - 15  CBC     Status: None   Collection Time: 01/09/15 11:00 AM  Result Value Ref Range   WBC 7.4 4.0 - 10.5 K/uL   RBC 4.40 3.87 - 5.11 MIL/uL   Hemoglobin 12.5 12.0 - 15.0 g/dL   HCT 37.8 36.0 - 46.0 %   MCV 85.9 78.0 - 100.0 fL   MCH 28.4 26.0 - 34.0 pg   MCHC 33.1 30.0 - 36.0 g/dL   RDW 13.9 11.5 - 15.5 %   Platelets 212 150 - 400 K/uL  I-stat troponin, ED     Status: None   Collection Time: 01/09/15 11:04 AM  Result Value Ref Range   Troponin i, poc 0.00 0.00 - 0.08 ng/mL   Comment 3             Imaging results:  Dg Chest 2 View  01/09/2015   CLINICAL DATA:  Chest pain.  EXAM: CHEST  2 VIEW  COMPARISON:  December 01, 2013.  FINDINGS: The heart size and mediastinal contours are within normal limits. No pneumothorax or pleural effusion is noted. Stable scarring is noted in both lung bases. No acute pulmonary disease is noted. The visualized skeletal structures are unremarkable.  IMPRESSION: No active cardiopulmonary disease.   Electronically Signed   By: Marijo Conception, M.D.   On: 01/09/2015 11:49    Other results: EKG: Ventricular Rate: 108 PR Interval:  QRS Duration: 71 QT Interval: 344 QTC Calculation: 461 R Axis: 66  Afib/flut and V-paced complexes, low voltage, no st elevations   Assessment & Plan by Problem: 81 yo woman with history of afib on dilt, Alzheimer dementia, anxiety, HTN, and hypothyroidism who came  in for an afib episode in the morning which was accompanied by chest pain.   Chest pain due to afib with RVR: Patient with longstanding history of rate controlled afib on diltiazem who presented with sharp non-radiating substernal chest pain at the time of afib episode, which had resolved by the time she was in the ER.  EKG showed afib with RVR, CXR normal. 1st trop < 0.01. Normal CBC with no elevation in WC. BMP shows slight increase in  creatinine  Will observe overnight for cardiac monitoring  -telemetry overnight with pulse ox -Troponins x 3 trend, 1st one normal -continue diltiazem  -TTE -NS at 75 ml/hr for 12 hours   AKI: likely due to dehydration.  Baseline around 1 --> 1.47 today -NS at 75 cc/hr for 12 hours -repeat BMP tomorrow   CAD with history of stents: -on plavix  Hypothyroidism -on synthroid -ordered TSH  Alzheimers Dementia: -On namenda  Depression -celexa  History of UTI- patient does not complain of UTI symptoms, no systemic symptoms, pertinent labs normal -Urine microscopy with reflex culture  #FEN: NS at 75 cc/hr for 12 hours  -Diet: carb modified  #DVT prophylaxis: lovenox  #CODE STATUS: FULL    Dispo: Disposition is deferred at this time, awaiting improvement of current medical problems. Anticipated discharge in approximately 1 day(s).   The patient does have a current PCP (Sande Brothers, MD) and does need an [facility] hospital follow-up appointment after discharge.  The patient does not have transportation limitations that hinder transportation to clinic appointments.  Signed: Burgess Estelle, MD 01/09/2015, 12:51 PM '

## 2015-01-09 NOTE — ED Notes (Signed)
Attempted to call report

## 2015-01-09 NOTE — ED Provider Notes (Addendum)
CSN: [id number]     Arrival date & time 01/09/15  1047 History   First MD Initiated Contact with Patient 01/09/15 1050     Chief Complaint  Patient presents with  . Chest Pain     (Consider location/radiation/quality/duration/timing/severity/associated sxs/prior Treatment) Patient is a 81 y.o. female presenting with chest pain. The history is provided by the patient and a relative.  Chest Pain Associated symptoms: palpitations and shortness of breath   Associated symptoms: no abdominal pain, no back pain, no fever, no headache, no nausea and not vomiting    patient from [facility]. She is there at the memory care unit. However patient seems to be alert and able to answer questions fairly well. Family members here as well. Family states that the patient started to complain of chest pain at around 9:30, bilateral anterior chest with some radiation to the jaw and neck. Also associated with shortness of breath. EMS gave her 2 nitroglycerin and route and she got 2 at the facility without any real change in the pain. Upon arrival here the pain is minimal. Family states that this tends to occur when she goes into atrial fibrillation. Monitor is consistent with atrial fibrillation. With heart rate around 115.  In addition patient has a history of coronary artery disease and does have a stent most recent cardiac cath was October 2010  Patient with history of atrial fibrillation but not on blood thinners because she had severe GI bleed.  Past Medical History  Diagnosis Date  . Alzheimer disease prolapsed bladder  . Atrial fibrillation ([place])   . Anxiety   . Hypertension   . COPD (chronic obstructive pulmonary disease) ([place])   . Hx: UTI (urinary tract infection)   . Chronic kidney disease, stage III (moderate)   . GERD (gastroesophageal reflux disease)   . Dyslipidemia   . Hypothyroid   . CAD (coronary artery disease), native coronary artery     H/O PTCA, balloon angioplasty Mid LAD in 2001,  4.0x18 vision in proximal RCA 3.2010 and cardiac cath 01/2009 revealed patent PCI sites  . H/O: GI bleed     Massive rectal bleed 2008. Not a coumadin candidate. A. Fib without recurrence.     Past Surgical History  Procedure Laterality Date  . Coronary angioplasty with stent placement  bladder tacking  . Myocardial infarct    . Total vaginal hysterectomy     No family history on file. Social History  Substance Use Topics  . Smoking status: Former Smoker    Quit date: 03/18/1959  . Smokeless tobacco: None  . Alcohol Use: No   OB History    Gravida Para Term Preterm AB TAB SAB Ectopic Multiple Living   4 4 4       4      Review of Systems  Constitutional: Negative for fever.  HENT: Negative for congestion.   Eyes: Negative for redness.  Respiratory: Positive for shortness of breath.   Cardiovascular: Positive for chest pain and palpitations.  Gastrointestinal: Negative for nausea, vomiting and abdominal pain.  Genitourinary: Negative for dysuria.  Musculoskeletal: Negative for back pain.  Neurological: Negative for headaches.  Hematological: Bruises/bleeds easily.  Psychiatric/Behavioral: Positive for confusion.      Allergies  Codeine and Tuberculin tests  Home Medications   Prior to Admission medications   Medication Sig Start Date End Date Taking? Authorizing Provider  acetaminophen (TYLENOL) 650 MG CR tablet Take 650 mg by mouth every 8 (eight) hours as needed for pain (pain).  Historical Provider, MD  ALPRAZolam Duanne Moron) 0.25 MG tablet Take 0.25 mg by mouth every 8 (eight) hours as needed for anxiety.    Historical Provider, MD  alum & mag hydroxide-simeth (MAALOX/MYLANTA) 200-200-20 MG/5ML suspension Take 30 mLs by mouth every 6 (six) hours as needed for indigestion or heartburn.    Historical Provider, MD  Cholecalciferol (VITAMIN D-3) 5000 UNITS TABS Take 1 tablet by mouth daily.    Historical Provider, MD  citalopram (CELEXA) 10 MG tablet Take 10 mg by mouth  daily.      Historical Provider, MD  clopidogrel (PLAVIX) 75 MG tablet Take 75 mg by mouth daily.    Historical Provider, MD  diltiazem (DILACOR XR) 120 MG 24 hr capsule Take 120 mg by mouth daily.    Historical Provider, MD  furosemide (LASIX) 20 MG tablet Take 20 mg by mouth daily.    Historical Provider, MD  guaifenesin (ROBITUSSIN) 100 MG/5ML syrup Take 200 mg by mouth every 6 (six) hours as needed for cough.     Historical Provider, MD  HYDROcodone-acetaminophen (NORCO/VICODIN) 5-325 MG per tablet Take 1 tablet by mouth every 4 (four) hours as needed for moderate pain or severe pain.    Historical Provider, MD  hydrocortisone cream 1 % Apply 1 application topically 2 (two) times daily as needed for itching.    Historical Provider, MD  isosorbide mononitrate (IMDUR) 30 MG 24 hr tablet Take 30 mg by mouth daily.      Historical Provider, MD  levothyroxine (SYNTHROID, LEVOTHROID) 75 MCG tablet Take 37.5 mcg by mouth daily before breakfast.    Historical Provider, MD  loperamide (IMODIUM) 2 MG capsule Take 2 mg by mouth as needed for diarrhea or loose stools.    Historical Provider, MD  magnesium hydroxide (MILK OF MAGNESIA) 400 MG/5ML suspension Take 30 mLs by mouth at bedtime as needed for mild constipation.     Historical Provider, MD  Memantine HCl ER (NAMENDA XR) 28 MG CP24 Take 28 mg by mouth daily.    Historical Provider, MD  neomycin-bacitracin-polymyxin (NEOSPORIN) ointment Apply 1 application topically every 12 (twelve) hours. As needed for skin tears    Historical Provider, MD  nitroGLYCERIN (NITROSTAT) 0.4 MG SL tablet Place 0.4 mg under the tongue every 5 (five) minutes as needed for chest pain.    Historical Provider, MD  potassium chloride (K-DUR,KLOR-CON) 10 MEQ tablet Take 10 mEq by mouth daily.    Historical Provider, MD  protein supplement (RESOURCE BENEPROTEIN) POWD Take 1 scoop by mouth daily.     Historical Provider, MD  quinapril (ACCUPRIL) 40 MG tablet Take 40 mg by mouth  daily.     Historical Provider, MD  traMADol (ULTRAM) 50 MG tablet Take 50 mg by mouth every 6 (six) hours as needed for moderate pain or severe pain.    Historical Provider, MD   BP 96/76 mmHg  Pulse 82  Temp(Src) 98.2 F (36.8 C) (Oral)  Resp 19  Ht 5\' 3"  (1.6 m)  Wt 135 lb (61.236 kg)  BMI 23.92 kg/m2  SpO2 98% Physical Exam  Constitutional: She is oriented to person, place, and time. She appears well-developed and well-nourished. No distress.  HENT:  Head: Normocephalic and atraumatic.  Mouth/Throat: Oropharynx is clear and moist.  Eyes: Conjunctivae and EOM are normal. Pupils are equal, round, and reactive to light.  Neck: Normal range of motion. Neck supple.  Cardiovascular: Normal heart sounds.   No murmur heard. Slightly tachycardic irregular  Pulmonary/Chest: [place] normal and  breath sounds normal. No respiratory distress.  Abdominal: Soft.  Musculoskeletal: Normal range of motion. She exhibits no edema.  Neurological: She is alert and oriented to person, place, and time. No cranial nerve deficit. She exhibits normal muscle tone. Coordination normal.  Skin: Skin is warm. No rash noted.  Nursing note and vitals reviewed.   ED Course  Procedures (including critical care time) Labs Review Labs Reviewed  BASIC METABOLIC PANEL - Abnormal; Notable for the following:    Sodium 134 (*)    Chloride 98 (*)    Glucose, Bld 113 (*)    BUN 26 (*)    Creatinine, Ser 1.47 (*)    GFR calc non Af Amer 31 (*)    GFR calc Af Amer 36 (*)    All other components within normal limits  CBC  I-STAT TROPOININ, ED   Results for orders placed or performed during the hospital encounter of 54/56/25  Basic metabolic panel  Result Value Ref Range   Sodium 134 (L) 135 - 145 mmol/L   Potassium 5.1 3.5 - 5.1 mmol/L   Chloride 98 (L) 101 - 111 mmol/L   CO2 26 22 - 32 mmol/L   Glucose, Bld 113 (H) 65 - 99 mg/dL   BUN 26 (H) 6 - 20 mg/dL   Creatinine, Ser 1.47 (H) 0.44 - 1.00 mg/dL    Calcium 9.3 8.9 - 10.3 mg/dL   GFR calc non Af Amer 31 (L) >60 mL/min   GFR calc Af Amer 36 (L) >60 mL/min   Anion gap 10 5 - 15  CBC  Result Value Ref Range   WBC 7.4 4.0 - 10.5 K/uL   RBC 4.40 3.87 - 5.11 MIL/uL   Hemoglobin 12.5 12.0 - 15.0 g/dL   HCT 37.8 36.0 - 46.0 %   MCV 85.9 78.0 - 100.0 fL   MCH 28.4 26.0 - 34.0 pg   MCHC 33.1 30.0 - 36.0 g/dL   RDW 13.9 11.5 - 15.5 %   Platelets 212 150 - 400 K/uL  I-stat troponin, ED  Result Value Ref Range   Troponin i, poc 0.00 0.00 - 0.08 ng/mL   Comment 3             Imaging Review Dg Chest 2 View  01/09/2015   CLINICAL DATA:  Chest pain.  EXAM: CHEST  2 VIEW  COMPARISON:  December 01, 2013.  FINDINGS: The heart size and mediastinal contours are within normal limits. No pneumothorax or pleural effusion is noted. Stable scarring is noted in both lung bases. No acute pulmonary disease is noted. The visualized skeletal structures are unremarkable.  IMPRESSION: No active cardiopulmonary disease.   Electronically Signed   By: Marijo Conception, M.D.   On: 01/09/2015 11:49   I have personally reviewed and evaluated these images and lab results as part of my medical decision-making.   EKG Interpretation   Date/Time:  Thursday January 09 2015 10:49:34 EDT Ventricular Rate:  108 PR Interval:    QRS Duration: 71 QT Interval:  344 QTC Calculation: 461 R Axis:   66 Text Interpretation:  Afib/flut and V-paced complexes No further rhythm  analysis attempted due to paced rhythm Low voltage, extremity and  precordial leads Minimal ST depression, diffuse leads Baseline wander in  lead(s) I III aVL No significant change since last tracing Confirmed by  Thecla Forgione  MD, Emmaus Brandi ([id number]) on 01/09/2015 10:59:08 AM      MDM   Final diagnoses:  Chest pain, unspecified chest  pain type  Atrial fibrillation with RVR ([place])    Patient with complaint of chest pain onset at about 9:30 this morning. Family states that her complaint of chest pain is  usually fairly reliable despite her memory deficit problems. Patient also was in atrial fib with a little faster rate upon arrival fasted rate was maybe around 115. Currently patient's heart rate is in the 80s and will sometimes jump up to about 100. Still in atrial fib. Patient not on any blood thinners best I can tell due to GI bleeding problems she is on Plavix. Patient is on diltiazem, was given a dose of that this morning. That may be why her heart rates improving.  Initial workup first troponin negative. Chest x-ray negative. Renal insufficiency, no leukocytosis no significant anemia.  Patient will require admission for further monitoring of the heart rate and for the chest pain. The main physician provider at [facility] is Dr. Sande Brothers based out of [facility].    Fredia Sorrow, MD 01/09/15 [place], MD 01/09/15 [place], MD 01/09/15 [phone]

## 2015-01-09 NOTE — ED Notes (Signed)
Pt back from x-ray.

## 2015-01-09 NOTE — ED Notes (Signed)
Pt arrives from [facility] from the memory care unit c/o new onset CP in L chest, radiating to jaw and neck.  EMS reports some SOB now resolved.  EMS pt received 2 NTG at facility and 2 NTG on truck.  Pt reports pain minimal at this time.  Pt has hx afib.  NAD at this time, resp e/u.

## 2015-01-09 NOTE — ED Notes (Signed)
MD at bedside. 

## 2015-01-10 ENCOUNTER — Observation Stay (HOSPITAL_BASED_OUTPATIENT_CLINIC_OR_DEPARTMENT_OTHER): Payer: Medicare Other

## 2015-01-10 ENCOUNTER — Telehealth: Payer: Self-pay | Admitting: Internal Medicine

## 2015-01-10 DIAGNOSIS — N179 Acute kidney failure, unspecified: Secondary | ICD-10-CM | POA: Diagnosis not present

## 2015-01-10 DIAGNOSIS — R079 Chest pain, unspecified: Secondary | ICD-10-CM | POA: Diagnosis not present

## 2015-01-10 DIAGNOSIS — I4891 Unspecified atrial fibrillation: Secondary | ICD-10-CM | POA: Diagnosis not present

## 2015-01-10 DIAGNOSIS — I2510 Atherosclerotic heart disease of native coronary artery without angina pectoris: Secondary | ICD-10-CM | POA: Diagnosis not present

## 2015-01-10 DIAGNOSIS — R0602 Shortness of breath: Secondary | ICD-10-CM | POA: Diagnosis not present

## 2015-01-10 DIAGNOSIS — Z955 Presence of coronary angioplasty implant and graft: Secondary | ICD-10-CM | POA: Diagnosis not present

## 2015-01-10 DIAGNOSIS — R8271 Bacteriuria: Secondary | ICD-10-CM

## 2015-01-10 LAB — BASIC METABOLIC PANEL
ANION GAP: 12 (ref 5–15)
BUN: 22 mg/dL — AB (ref 6–20)
CHLORIDE: 99 mmol/L — AB (ref 101–111)
CO2: 26 mmol/L (ref 22–32)
Calcium: 8.9 mg/dL (ref 8.9–10.3)
Creatinine, Ser: 1.33 mg/dL — ABNORMAL HIGH (ref 0.44–1.00)
GFR calc Af Amer: 40 mL/min — ABNORMAL LOW (ref >60)
GFR calc non Af Amer: 35 mL/min — ABNORMAL LOW (ref >60)
GLUCOSE: 93 mg/dL (ref 65–99)
POTASSIUM: 4.7 mmol/L (ref 3.5–5.1)
Sodium: 137 mmol/L (ref 135–145)

## 2015-01-10 LAB — URINE MICROSCOPIC-ADD ON

## 2015-01-10 LAB — URINALYSIS, ROUTINE W REFLEX MICROSCOPIC
Bilirubin Urine: NEGATIVE
GLUCOSE, UA: NEGATIVE mg/dL
Ketones, ur: NEGATIVE mg/dL
Nitrite: NEGATIVE
PH: 5.5 (ref 5.0–8.0)
Protein, ur: 30 mg/dL — AB
SPECIFIC GRAVITY, URINE: 1.017 (ref 1.005–1.030)
Urobilinogen, UA: 0.2 mg/dL (ref 0.0–1.0)

## 2015-01-10 LAB — MAGNESIUM: MAGNESIUM: 2.1 mg/dL (ref 1.7–2.4)

## 2015-01-10 MED ORDER — DILTIAZEM HCL ER 180 MG PO CP24: 180.0000 mg | ORAL_CAPSULE | Freq: Every day | ORAL | Status: DC

## 2015-01-10 MED ORDER — SODIUM CHLORIDE 0.9 % IV SOLN
INTRAVENOUS | Status: AC
  Administered 2015-01-10: 14:00:00 via INTRAVENOUS

## 2015-01-10 MED ORDER — DILTIAZEM HCL ER 180 MG PO CP24
180.0000 mg | ORAL_CAPSULE | Freq: Every day | ORAL | Status: DC
  Administered 2015-01-10 – 2015-01-11 (×2): 180 mg via ORAL
  Filled 2015-01-10 (×3): qty 1

## 2015-01-10 NOTE — Plan of Care (Signed)
Problem: Phase I Progression Outcomes Goal: Initial discharge plan identified Outcome: Completed/Met Date Met:  01/10/15 To go back to [facility]

## 2015-01-10 NOTE — Telephone Encounter (Signed)
Called the pt's daughter and gave her an update for [name]- told her we will keep her overnight- and she is likely to be discharged tomorrow late morning, but that it depends on how she does overnight. She asked about the previous UTI, and so I informed that we generally don't treat if pt is not having any symptoms, and that she is likely to get more side effects from the antibiotics.

## 2015-01-10 NOTE — Progress Notes (Signed)
  Echocardiogram 2D Echocardiogram has been performed.  Diamond Nickel 01/10/2015, 3:51 PM

## 2015-01-10 NOTE — Progress Notes (Signed)
  Date: 01/10/2015  Patient name: [name]  Medical record number: [id number]  Date of birth: 06-11-1925   This patient's plan of care was discussed with the house staff. Please see Dr. Sherlynn Carbon note for complete details. I concur with his findings.  [name] continues to have HR > 100 overnight and on EKG this AM. Kidney function is improved but not to baseline.  Will increase diltiazem dose.  She is not on anticoagulation due to history of GI bleeding from review of chart (she does not remember this).  Likely discharge to home tomorrow.    Sid Falcon, MD 01/10/2015, 3:51 PM

## 2015-01-10 NOTE — Clinical Social Work Note (Addendum)
[employer] Work Assessment  Patient Details  Name: [name] MRN: [id number] Date of Birth: 1925/05/11  Date of referral:  01/10/15               Reason for consult:  Facility Placement (Return to [facility])                Permission sought to share information with:  Family Supports, Chartered certified accountant granted to share information::  Yes, Verbal Permission Granted  Name::     [name], [name] gave permisson as patient is pleasantly confused.  Agency::     Relationship::     Contact Information:     Housing/Transportation Living arrangements for the past 2 months:  [place] of Information:  Patient, Adult Children Patient Interpreter Needed:  None Criminal Activity/Legal Involvement Pertinent to Current Situation/Hospitalization:  No - Comment as needed Significant Relationships:  Adult Children Lives with:   [place] Do you feel safe going back to the place where you live?  Yes Need for family participation in patient care:  Yes (Comment)  Care giving concerns: Patient resides in [place] with plan to return to facility at d/c.   Social Worker assessment / plan: 81 year old female admitted from [facility] where she is a long term care resident.  CSW notified by Dr. Daryll Drown that plans to d/c back to facility on Saturday as she needs to adjust patient's medications.  CSW spoke with Teresa GladOccupational psychologist at [name]. She agrees for patient to return to facility over the weekend as long as she receives an updated, signed Fl2 with correct meds prior to d/c t facility.  Fl2 initiated and placed on chart for MD's signature.  Weekend CSW handoff report left with above information for follow up tomorrow.    Employment status:  Retired Nurse, adult PT Recommendations:  Not assessed at this time Information / Referral to community resources:   (Return  to [place] when stable)  Patient/Family's Response to care:  Patient is alert to person only. Very pleasant. States she wants to return "home" to ALF. [name] plans return to facility and will transport patient via car.     Emotional Assessment Appearance:  Appears stated age Attitude/Demeanor/Rapport:   (Calm, relaxed, friendly) Patient/Family's Understanding of and Emotional Response to Diagnosis, Current Treatment, and Prognosis: [name] verbalizes good understanding of his mother's current medical condition and d/c needs.  He feels she is well managed at the ALF and wants her to return there when medically stable.     Affect (typically observed):  Quiet, Calm, Pleasant Orientation:  Oriented to Self Alcohol / Substance use:  Never Used Psych involvement (Current and /or in the community):  No (Comment)  Discharge Needs  Concerns to be addressed:  Care Coordination Readmission within the last 30 days:  No Current discharge risk:  Cognitively Impaired Barriers to Discharge:  No Barriers Identified, Continued Medical Work up   [name] 01/10/2015 5:45 PM

## 2015-01-10 NOTE — Progress Notes (Addendum)
Subjective:  Patient seen and examined at bedside. No complaints overnight, pt feels that she is "as good as an old lady can be".   Objective: Vital signs in last 24 hours: Filed Vitals:   01/09/15 2118 01/10/15 0001 01/10/15 0421 01/10/15 0929  BP: 128/73 126/70 128/67 104/60  Pulse: 112 108 105   Temp: 97.5 F (36.4 C) 97.3 F (36.3 C) 97.3 F (36.3 C) 98.7 F (37.1 C)  TempSrc: Oral Oral Oral Oral  Resp: 18 17 18 21   Height:      Weight:   142 lb 15.9 oz (64.86 kg)   SpO2: 99% 100% 100% 100%   Weight change:   Intake/Output Summary (Last 24 hours) at 01/10/15 1456 Last data filed at 01/10/15 0933  Gross per 24 hour  Intake 1293.75 ml  Output    600 ml  Net 693.75 ml   General: alert, oriented to person, place, but not time. CV: irregularly irregular rhythm, tachycardic,  no m/r/g, no carotid bruits appreciated, no JVD appreciated Resp: equal and symmetric breath sounds, no wheezing heard Abdomen: soft, nontender, nondistended, +BS in all 4 quadrants, no bruits appreciated Skin: warm, dry, intact, has minor petechiae from bruises Extremities: pulses intact b/l, no edema, clubbing or cyanosis, no calf tenderness   Lab Results: Results for orders placed or performed during the hospital encounter of 01/09/15 (from the past 24 hour(s))  CBC     Status: None   Collection Time: 01/09/15  4:30 PM  Result Value Ref Range   WBC 7.4 4.0 - 10.5 K/uL   RBC 4.35 3.87 - 5.11 MIL/uL   Hemoglobin 12.4 12.0 - 15.0 g/dL   HCT 37.1 36.0 - 46.0 %   MCV 85.3 78.0 - 100.0 fL   MCH 28.5 26.0 - 34.0 pg   MCHC 33.4 30.0 - 36.0 g/dL   RDW 13.7 11.5 - 15.5 %   Platelets 203 150 - 400 K/uL  Creatinine, serum     Status: Abnormal   Collection Time: 01/09/15  4:30 PM  Result Value Ref Range   Creatinine, Ser 1.46 (H) 0.44 - 1.00 mg/dL   GFR calc non Af Amer 31 (L) >60 mL/min   GFR calc Af Amer 36 (L) >60 mL/min  TSH     Status: None   Collection Time: 01/09/15  4:30 PM  Result  Value Ref Range   TSH 1.552 0.350 - 4.500 uIU/mL  Troponin I (q 6hr x 3)     Status: None   Collection Time: 01/09/15  4:30 PM  Result Value Ref Range   Troponin I <0.03 <0.031 ng/mL  Troponin I (q 6hr x 3)     Status: None   Collection Time: 01/09/15 10:34 PM  Result Value Ref Range   Troponin I <0.03 <0.031 ng/mL  Basic metabolic panel     Status: Abnormal   Collection Time: 01/10/15  3:15 AM  Result Value Ref Range   Sodium 137 135 - 145 mmol/L   Potassium 4.7 3.5 - 5.1 mmol/L   Chloride 99 (L) 101 - 111 mmol/L   CO2 26 22 - 32 mmol/L   Glucose, Bld 93 65 - 99 mg/dL   BUN 22 (H) 6 - 20 mg/dL   Creatinine, Ser 1.33 (H) 0.44 - 1.00 mg/dL   Calcium 8.9 8.9 - 10.3 mg/dL   GFR calc non Af Amer 35 (L) >60 mL/min   GFR calc Af Amer 40 (L) >60 mL/min   Anion gap 12 5 -  15  Magnesium     Status: None   Collection Time: 01/10/15  3:15 AM  Result Value Ref Range   Magnesium 2.1 1.7 - 2.4 mg/dL  Urinalysis, Routine w reflex microscopic (not at [facility])     Status: Abnormal   Collection Time: 01/10/15  4:13 AM  Result Value Ref Range   Color, Urine YELLOW YELLOW   APPearance CLOUDY (A) CLEAR   Specific Gravity, Urine 1.017 1.005 - 1.030   pH 5.5 5.0 - 8.0   Glucose, UA NEGATIVE NEGATIVE mg/dL   Hgb urine dipstick TRACE (A) NEGATIVE   Bilirubin Urine NEGATIVE NEGATIVE   Ketones, ur NEGATIVE NEGATIVE mg/dL   Protein, ur 30 (A) NEGATIVE mg/dL   Urobilinogen, UA 0.2 0.0 - 1.0 mg/dL   Nitrite NEGATIVE NEGATIVE   Leukocytes, UA LARGE (A) NEGATIVE  Urine microscopic-add on     Status: Abnormal   Collection Time: 01/10/15  4:13 AM  Result Value Ref Range   Squamous Epithelial / LPF RARE RARE   WBC, UA TOO NUMEROUS TO COUNT <3 WBC/hpf   RBC / HPF 3-6 <3 RBC/hpf   Bacteria, UA FEW (A) RARE   Casts HYALINE CASTS (A) NEGATIVE    Micro Results: No results found for this or any previous visit (from the past 240 hour(s)). Studies/Results: Dg Chest 2 View  01/09/2015   CLINICAL DATA:   Chest pain.  EXAM: CHEST  2 VIEW  COMPARISON:  December 01, 2013.  FINDINGS: The heart size and mediastinal contours are within normal limits. No pneumothorax or pleural effusion is noted. Stable scarring is noted in both lung bases. No acute pulmonary disease is noted. The visualized skeletal structures are unremarkable.  IMPRESSION: No active cardiopulmonary disease.   Electronically Signed   By: Marijo Conception, M.D.   On: 01/09/2015 11:49   Medications: I have reviewed the patient's current medications. Scheduled Meds: . sodium chloride   Intravenous STAT  . citalopram  10 mg Oral Daily  . clopidogrel  75 mg Oral Daily  . diltiazem  180 mg Oral Daily  . docusate sodium  100 mg Oral Daily  . enoxaparin (LOVENOX) injection  30 mg Subcutaneous Q24H  . levothyroxine  37.5 mcg Oral QAC breakfast  . memantine  28 mg Oral Daily  . sodium chloride  3 mL Intravenous Q12H   Continuous Infusions:  PRN Meds:.acetaminophen **OR** acetaminophen, alum & mag hydroxide-simeth Assessment/Plan: 81 yo woman with history of afib on dilt, Alzheimer dementia, anxiety, HTN, and hypothyroidism who came in for an afib episode in the morning which was accompanied by chest pain, now resolved.   Afib with RVR: repeat EKG today AM still showed afib with rvr with rate of 115. She is currently rate controlled on dilt -continue telemetry -increased diltiazem to 180 mg daily -echo today -per audit request, calculated her CHADVASC2 score- it is atleast 4, giving her atleast 4.8% risk.  Typical chest pain associated with afib: resolved -trops x3 all normal -nitro PRN for recurrent chest pain  AKI: likely due to dehydration.trending down.  Baseline around 1 --> 1.47 -->1.33 -giving more NS 75 cc/hr for 6 hours today -repeat BMP tomorrow  Asymptomatic bacteruria: Ua was + for leuks and protein, patient is asymptomatic with no systemic symptoms, VSS -Does not require treatment    Dispo: Disposition is deferred  at this time, awaiting improvement of current medical problems.  Anticipated discharge in approximately 1 day(s).   The patient does have a current PCP Mikeal Hawthorne  Bowen, MD) and does need an [facility] hospital follow-up appointment after discharge.  The patient does not have transportation limitations that hinder transportation to clinic appointments.  .Services Needed at time of discharge: Y = Yes, Blank = No PT:   OT:   RN:   Equipment:   Other:     LOS: 1 day   Burgess Estelle, MD 01/10/2015, 2:56 PM

## 2015-01-11 LAB — BASIC METABOLIC PANEL
Anion gap: 11 (ref 5–15)
BUN: 15 mg/dL (ref 6–20)
CALCIUM: 8.7 mg/dL — AB (ref 8.9–10.3)
CO2: 24 mmol/L (ref 22–32)
CREATININE: 1 mg/dL (ref 0.44–1.00)
Chloride: 103 mmol/L (ref 101–111)
GFR calc non Af Amer: 49 mL/min — ABNORMAL LOW (ref >60)
GFR, EST AFRICAN AMERICAN: 57 mL/min — AB (ref >60)
Glucose, Bld: 91 mg/dL (ref 65–99)
Potassium: 4 mmol/L (ref 3.5–5.1)
SODIUM: 138 mmol/L (ref 135–145)

## 2015-01-11 MED ORDER — DILTIAZEM HCL ER 180 MG PO CP24: 180.0000 mg | ORAL_CAPSULE | Freq: Every day | ORAL | Status: AC

## 2015-01-11 NOTE — Progress Notes (Signed)
Subjective:  Patient was seen and examined this morning. She states she feels well and denies any chest pain, shortness of breath, rapid heart rate, or lightheadedness.    Objective: Filed Vitals:   01/10/15 0929 01/10/15 1556 01/10/15 2025 01/11/15 0517  BP: 104/60 111/72 103/64 108/53  Pulse:  70 111 81  Temp: 98.7 F (37.1 C) 98.5 F (36.9 C) 98.3 F (36.8 C) 98.1 F (36.7 C)  TempSrc: Oral Oral Oral Oral  Resp: 21 18 18 18   Height:      Weight:    137 lb 9.6 oz (62.415 kg)  SpO2: 100% 99% 96% 97%   General: Vital signs reviewed.  Patient is elderly female, in no acute distress and cooperative with exam.  Cardiovascular: Irregularly irregular, rate controlled, no murmurs, gallops, or rubs. Pulmonary/Chest: Clear to auscultation bilaterally, no wheezes, rales, or rhonchi. Abdominal: Soft, non-tender, non-distended, BS + Extremities: No lower extremity edema bilaterally, pulses symmetric and intact bilaterally.  Skin: Warm, dry and intact. No rashes or erythema.   Weight change: 2 lb 9.6 oz (1.179 kg)  Intake/Output Summary (Last 24 hours) at 01/11/15 1037 Last data filed at 01/11/15 0646  Gross per 24 hour  Intake    290 ml  Output   1600 ml  Net  -1310 ml   Lab Results: Basic Metabolic Panel:  Recent Labs Lab 01/10/15 0315 01/11/15 0418  NA 137 138  K 4.7 4.0  CL 99* 103  CO2 26 24  GLUCOSE 93 91  BUN 22* 15  CREATININE 1.33* 1.00  CALCIUM 8.9 8.7*  MG 2.1  --    CBC:  Recent Labs Lab 01/09/15 1100 01/09/15 1630  WBC 7.4 7.4  HGB 12.5 12.4  HCT 37.8 37.1  MCV 85.9 85.3  PLT 212 203   Cardiac Enzymes:  Recent Labs Lab 01/09/15 1630 01/09/15 2234  TROPONINI <0.03 <0.03   Thyroid Function Tests:  Recent Labs Lab 01/09/15 1630  TSH 1.552   Urinalysis:  Recent Labs Lab 01/10/15 0413  COLORURINE YELLOW  LABSPEC 1.017  PHURINE 5.5  GLUCOSEU NEGATIVE  HGBUR TRACE*  BILIRUBINUR NEGATIVE  KETONESUR NEGATIVE  PROTEINUR 30*    UROBILINOGEN 0.2  NITRITE NEGATIVE  LEUKOCYTESUR LARGE*   Studies/Results: Dg Chest 2 View  01/09/2015   CLINICAL DATA:  Chest pain.  EXAM: CHEST  2 VIEW  COMPARISON:  December 01, 2013.  FINDINGS: The heart size and mediastinal contours are within normal limits. No pneumothorax or pleural effusion is noted. Stable scarring is noted in both lung bases. No acute pulmonary disease is noted. The visualized skeletal structures are unremarkable.  IMPRESSION: No active cardiopulmonary disease.   Electronically Signed   By: Marijo Conception, M.D.   On: 01/09/2015 11:49   Medications:  I have reviewed the patient's current medications. Prior to Admission:  Prescriptions prior to admission  Medication Sig Dispense Refill Last Dose  . acetaminophen (TYLENOL) 500 MG tablet Take 500 mg by mouth every 6 (six) hours as needed.   01/06/2015 at Unknown time  . benzocaine (ORAJEL) 10 % mucosal gel Use as directed 1 application in the mouth or throat as needed for mouth pain. Right gum   01/08/2015 at Unknown time  . Cholecalciferol (VITAMIN D-3) 5000 UNITS TABS Take 1 tablet by mouth daily.   01/09/2015 at Unknown time  . citalopram (CELEXA) 10 MG tablet Take 10 mg by mouth daily.     01/09/2015 at Unknown time  . clopidogrel (PLAVIX) 75 MG  tablet Take 75 mg by mouth daily.   01/09/2015 at Unknown time  . diltiazem (DILACOR XR) 120 MG 24 hr capsule Take 120 mg by mouth daily.   01/09/2015 at Unknown time  . docusate sodium (COLACE) 100 MG capsule Take 100 mg by mouth daily.   01/09/2015 at Unknown time  . furosemide (LASIX) 20 MG tablet Take 20 mg by mouth daily.   01/09/2015 at Unknown time  . isosorbide mononitrate (IMDUR) 30 MG 24 hr tablet Take 30 mg by mouth daily.     01/09/2015 at Unknown time  . levothyroxine (SYNTHROID, LEVOTHROID) 75 MCG tablet Take 37.5 mcg by mouth daily before breakfast.   01/09/2015 at Unknown time  . Memantine HCl ER (NAMENDA XR) 28 MG CP24 Take 28 mg by mouth daily.   01/09/2015 at Unknown  time  . potassium chloride (K-DUR,KLOR-CON) 10 MEQ tablet Take 10 mEq by mouth daily.   01/09/2015 at Unknown time  . protein supplement (RESOURCE BENEPROTEIN) POWD Take 1 scoop by mouth daily.    01/09/2015 at Unknown time  . quinapril (ACCUPRIL) 40 MG tablet Take 40 mg by mouth daily.    01/09/2015 at Unknown time  . acetaminophen (TYLENOL) 650 MG CR tablet Take 650 mg by mouth every 8 (eight) hours as needed for pain (pain).    prn  . ALPRAZolam (XANAX) 0.25 MG tablet Take 0.25 mg by mouth every 8 (eight) hours as needed for anxiety.   unknown at unknown time  . alum & mag hydroxide-simeth (MAALOX/MYLANTA) 200-200-20 MG/5ML suspension Take 30 mLs by mouth every 6 (six) hours as needed for indigestion or heartburn.   prn  . guaifenesin (ROBITUSSIN) 100 MG/5ML syrup Take 200 mg by mouth every 6 (six) hours as needed for cough.    prn  . HYDROcodone-acetaminophen (NORCO/VICODIN) 5-325 MG per tablet Take 1 tablet by mouth every 4 (four) hours as needed for moderate pain or severe pain.   unknown at unknown time  . hydrocortisone cream 1 % Apply 1 application topically 2 (two) times daily as needed for itching.   unknown at unknown time  . loperamide (IMODIUM) 2 MG capsule Take 2 mg by mouth as needed for diarrhea or loose stools.   prn  . magnesium hydroxide (MILK OF MAGNESIA) 400 MG/5ML suspension Take 30 mLs by mouth at bedtime as needed for mild constipation.    prn  . neomycin-bacitracin-polymyxin (NEOSPORIN) ointment Apply 1 application topically every 12 (twelve) hours. As needed for skin tears   prn  . nitroGLYCERIN (NITROSTAT) 0.4 MG SL tablet Place 0.4 mg under the tongue every 5 (five) minutes as needed for chest pain.   rescue  . traMADol (ULTRAM) 50 MG tablet Take 50 mg by mouth every 6 (six) hours as needed for moderate pain or severe pain.   unknown at unknown time   Scheduled Meds: . citalopram  10 mg Oral Daily  . clopidogrel  75 mg Oral Daily  . diltiazem  180 mg Oral Daily  .  docusate sodium  100 mg Oral Daily  . enoxaparin (LOVENOX) injection  30 mg Subcutaneous Q24H  . levothyroxine  37.5 mcg Oral QAC breakfast  . memantine  28 mg Oral Daily  . sodium chloride  3 mL Intravenous Q12H   Continuous Infusions:  PRN Meds:.acetaminophen **OR** acetaminophen, alum & mag hydroxide-simeth Assessment/Plan: Active Problems:   Atrial fibrillation (HCC)   Pain in the chest   AKI (acute kidney injury) ([place])  Atrial Fibrillation with RVR: Patient  was admitted with atrial fibrillation with RVR up to the 110s. Yesterday, patient's diltiazem was increased from 120 mg daily to 180 mg daily for better rate control. This morning, patient's rate is in the 80s-90s. Telemetry reviewed without any alarms. CHADS2-VASC score of 5; however, patient is not on oral anticoagulation due to massive GIB while on coumadin in the past. -Continue Cardizem 180 mg daily -No oral anticoagulation given history of massive GIB -Discharge back to nursing home today  Chest Pain: Resolved. Patient has a history of chest pain when she goes into atrial fibrillation with RVR. Chest pain resolved after 3-4 hours after onset on admission. Given history of CAD and stent placement, patient was admitted for chest pain rule out. Troponins were negative and EKG showed no acute ischemic changes. Patient was continued on her home diltiazem and plavix.  -Continue Diltiazem 180 mg daily -Continue Plavix 75 mg daily  AKI: Resolved. Creatinine elevated on admission at 1.47 which has trended down 1.33 to 1.00 today. Patient's baseline is around 1.0. AKI was likely secondary to atrial fibrillation with RVR and poor renal perfusion.  -Resolved  Hypothyroidism: TSH normal. Patient is on levothyroxine 37.5 mcg daily at home.  -Continue levothyroxine 37.5 mcg daily  DVT/PE ppx: Lovenox SQ QD  Dispo: Anticipated discharge today.  The patient does have a current PCP (Sande Brothers, MD) and does not need an [facility] hospital  follow-up appointment after discharge.  The patient does have transportation limitations that hinder transportation to clinic appointments.  .Services Needed at time of discharge: Y = Yes, Blank = No PT:   OT:   RN:   Equipment:   Other:     LOS: 2 days   Osa Craver, DO PGY-1 Internal Medicine Resident Pager # [phone] 01/11/2015 10:37 AM

## 2015-01-11 NOTE — Clinical Social Work Note (Signed)
Clinical Social Worker facilitated patient discharge including contacting patient family and facility to confirm patient discharge plans.  Clinical information faxed to facility and family agreeable with plan.  CSW confirmed patient discharge with Santiago Glad (Financial risk analyst).  CSW arranged transport via family to [facility].  RN to call report prior to discharge.  Clinical Social Worker will sign off for now as social work intervention is no longer needed. Please consult [place] again if new need arises.  Barbette Or, [place]

## 2015-01-11 NOTE — Discharge Summary (Signed)
Name: [name] MRN: [id number] DOB: [birth year] 81 y.o. PCP: Sande Brothers, MD  Date of Admission: 01/09/2015 10:47 AM Date of Discharge: 01/11/2015 Attending Physician: Sid Falcon, MD  Discharge Diagnosis:  Active Problems:   Atrial fibrillation ([place])   Pain in the chest   AKI (acute kidney injury) ([place]) Hypothyroidism  Discharge Medications:   Medication List    TAKE these medications        acetaminophen 650 MG CR tablet  Commonly known as:  TYLENOL  Take 650 mg by mouth every 8 (eight) hours as needed for pain (pain).     ALPRAZolam 0.25 MG tablet  Commonly known as:  XANAX  Take 0.25 mg by mouth every 8 (eight) hours as needed for anxiety.     alum & mag hydroxide-simeth 200-200-20 MG/5ML suspension  Commonly known as:  MAALOX/MYLANTA  Take 30 mLs by mouth every 6 (six) hours as needed for indigestion or heartburn.     benzocaine 10 % mucosal gel  Commonly known as:  ORAJEL  Use as directed 1 application in the mouth or throat as needed for mouth pain. Right gum     citalopram 10 MG tablet  Commonly known as:  CELEXA  Take 10 mg by mouth daily.     clopidogrel 75 MG tablet  Commonly known as:  PLAVIX  Take 75 mg by mouth daily.     diltiazem 180 MG 24 hr capsule  Commonly known as:  DILACOR XR  Take 1 capsule (180 mg total) by mouth daily.     docusate sodium 100 MG capsule  Commonly known as:  COLACE  Take 100 mg by mouth daily.     furosemide 20 MG tablet  Commonly known as:  LASIX  Take 20 mg by mouth daily.     guaifenesin 100 MG/5ML syrup  Commonly known as:  ROBITUSSIN  Take 200 mg by mouth every 6 (six) hours as needed for cough.     HYDROcodone-acetaminophen 5-325 MG tablet  Commonly known as:  NORCO/VICODIN  Take 1 tablet by mouth every 4 (four) hours as needed for moderate pain or severe pain.     hydrocortisone cream 1 %  Apply 1 application topically 2 (two) times daily as needed for itching.     isosorbide mononitrate 30 MG  24 hr tablet  Commonly known as:  IMDUR  Take 30 mg by mouth daily.     levothyroxine 75 MCG tablet  Commonly known as:  SYNTHROID, LEVOTHROID  Take 37.5 mcg by mouth daily before breakfast.     loperamide 2 MG capsule  Commonly known as:  IMODIUM  Take 2 mg by mouth as needed for diarrhea or loose stools.     magnesium hydroxide 400 MG/5ML suspension  Commonly known as:  MILK OF MAGNESIA  Take 30 mLs by mouth at bedtime as needed for mild constipation.     NAMENDA XR 28 MG Cp24 24 hr capsule  Generic drug:  memantine  Take 28 mg by mouth daily.     neomycin-bacitracin-polymyxin ointment  Commonly known as:  NEOSPORIN  Apply 1 application topically every 12 (twelve) hours. As needed for skin tears     nitroGLYCERIN 0.4 MG SL tablet  Commonly known as:  NITROSTAT  Place 0.4 mg under the tongue every 5 (five) minutes as needed for chest pain.     potassium chloride 10 MEQ tablet  Commonly known as:  K-DUR,KLOR-CON  Take 10 mEq by mouth daily.  protein supplement Powd  Take 1 scoop by mouth daily.     quinapril 40 MG tablet  Commonly known as:  ACCUPRIL  Take 40 mg by mouth daily.     traMADol 50 MG tablet  Commonly known as:  ULTRAM  Take 50 mg by mouth every 6 (six) hours as needed for moderate pain or severe pain.     Vitamin D-3 5000 UNITS Tabs  Take 1 tablet by mouth daily.        Disposition and follow-up:   [name] was discharged from [facility] in Good condition.  At the hospital follow up visit please address:  Atrial Fibrillation with RVR: RVR up to the 117s on admission. TSH normal, rule out for ACS. Patient was on Cardizem XR 120 mg at home. This was increased to 180 mg with better control of rate. Patient remained 80s-90s on new dose. Please assess blood pressure and rate control on this new dose.  Chest Pain: Likely secondary to atrial fibrillation with RVR which improved with rate control. Patient was rule out for ACS.    AKI: Creatinine elevated on admission, but improved with rate control and IV hydration. Creatinine was 1.00 at her baseline on day of discharge.  2.  Labs / imaging needed at time of follow-up: BMET within one week  3.  Pending labs/ test needing follow-up: None  Follow-up Appointments:   Discharge Instructions: Discharge Instructions    Diet - low sodium heart healthy    Complete by:  As directed      Increase activity slowly    Complete by:  As directed            Consultations:  None  Procedures Performed:  Dg Chest 2 View  01/09/2015   CLINICAL DATA:  Chest pain.  EXAM: CHEST  2 VIEW  COMPARISON:  December 01, 2013.  FINDINGS: The heart size and mediastinal contours are within normal limits. No pneumothorax or pleural effusion is noted. Stable scarring is noted in both lung bases. No acute pulmonary disease is noted. The visualized skeletal structures are unremarkable.  IMPRESSION: No active cardiopulmonary disease.   Electronically Signed   By: Marijo Conception, M.D.   On: 01/09/2015 11:49    2D Echo:  - Left ventricle: The cavity size was normal. Wall thickness was increased in a pattern of mild LVH. Systolic function was normal. The estimated ejection fraction was in the range of 55% to 60%. - Mitral valve: There was mild regurgitation. - Left atrium: The atrium was moderately dilated. - Right atrium: The atrium was moderately dilated. - Pulmonary arteries: PA peak pressure: 32 mm Hg (S). - Pericardium, extracardiac: A trivial pericardial effusion was identified.   Admission HPI: [name] is an 81 yo woman with history of Alzheimers with mild-moderate dementia, atrial fibrillation on diltiazem, HTN, CAD, CKD3, hypothyroidism, and GERD, who is came from [facility] memory care unit because she had a racing heart with sharp chest pain at 9.30 AM. The pain did not radiate to the arm or jaw. Pt has a history of afib rate controlled on diltiazem, and said she has  had similar episodes in the past when she would have periodic afib and then would resolve. She received her morning dose of diltiazem and noticed this so she called EMS and then she received 2 nitro in the EMS. She was accompanied by son and daughter who provided most of the history. Family said that her complain  of chest pain is very reliable despite the presence of advanced dementia.  In the ER, she was found to be in afib with RVR, and her heart rate ranged from 80- 120s. The first troponin was negative and CXR was unremarkable. Her EKG was showing afib/a flutter v- paced. She received another 2 nitro in the ER, and by the time she got to the ER, her chest pain had resolved.  She denied headache, dizziness, shortness of breath, n/v/d/c. She denied recent falls. She does not have a pacemaker. She had some stents placed in 2010. Per chart, her last episode of afib was in 2012. She is not on coumadin due to her history of GI bleed. She did not eat or drink anything today.  Hospital Course by problem list: Active Problems:   Atrial fibrillation (HCC)   Pain in the chest   AKI (acute kidney injury) ([place])  Atrial Fibrillation with RVR: Patient was admitted with atrial fibrillation with RVR up to the 110s. Patient's diltiazem was increased from 120 mg daily to 180 mg daily for better rate control. Day of discharge, patient's rate is in the 80s. CHADS2-VASC score of 5; however, patient is no on oral anticoagulation due to massive GIB on coumadin. Patient was discharge on Cardizem 180 mg daily. No oral anticoagulation given history of massive GIB.  Chest Pain: Resolved. Patient has a history of chest pain when she goes into atrial fibrillation with RVR. Chest pain resolved after 3-4 hours after onset on admission. Given history of CAD and stent placement, patient was admitted for chest pain rule out. Troponins were negative and EKG showed no acute ischemic changes. Patient was continued on her diltiazem  and plavix.   AKI: Resolved. Creatinine elevated on admission at 1.47 which has trended down 1.33 to 1.00 on day of discharge. Patient's baseline is around 1.0. AKI was likely secondary to atrial fibrillation with RVR and poor renal perfusion. Please check BMET within one week.  Hypothyroidism: TSH normal. Patient is on levothyroxine 37.5 mcg daily at home. We continued levothyroxine 37.5 mcg daily on discharge.  Discharge Vitals:   BP 108/53 mmHg  Pulse 81  Temp(Src) 98.1 F (36.7 C) (Oral)  Resp 18  Ht 5\' 3"  (1.6 m)  Wt 137 lb 9.6 oz (62.415 kg)  BMI 24.38 kg/m2  SpO2 97%  Discharge Labs:  Results for orders placed or performed during the hospital encounter of 01/09/15 (from the past 24 hour(s))  Basic metabolic panel     Status: Abnormal   Collection Time: 01/11/15  4:18 AM  Result Value Ref Range   Sodium 138 135 - 145 mmol/L   Potassium 4.0 3.5 - 5.1 mmol/L   Chloride 103 101 - 111 mmol/L   CO2 24 22 - 32 mmol/L   Glucose, Bld 91 65 - 99 mg/dL   BUN 15 6 - 20 mg/dL   Creatinine, Ser 1.00 0.44 - 1.00 mg/dL   Calcium 8.7 (L) 8.9 - 10.3 mg/dL   GFR calc non Af Amer 49 (L) >60 mL/min   GFR calc Af Amer 57 (L) >60 mL/min   Anion gap 11 5 - 15    Signed: Osa Craver, DO PGY-2 Internal Medicine Resident Pager # [phone] 01/11/2015 9:36 AM   Services Ordered on Discharge: None Equipment Ordered on Discharge: None

## 2016-05-25 ENCOUNTER — Emergency Department (HOSPITAL_COMMUNITY): Payer: Medicare Other

## 2016-05-25 ENCOUNTER — Encounter (HOSPITAL_COMMUNITY): Payer: Self-pay | Admitting: Emergency Medicine

## 2016-05-25 ENCOUNTER — Emergency Department (HOSPITAL_COMMUNITY)
Admission: EM | Admit: 2016-05-25 | Discharge: 2016-05-25 | Disposition: A | Discharge status: Home / Self Care | Payer: Medicare Other | Attending: Emergency Medicine | Admitting: Emergency Medicine

## 2016-05-25 DIAGNOSIS — Z87891 Personal history of nicotine dependence: Secondary | ICD-10-CM | POA: Insufficient documentation

## 2016-05-25 DIAGNOSIS — Z79899 Other long term (current) drug therapy: Secondary | ICD-10-CM | POA: Diagnosis not present

## 2016-05-25 DIAGNOSIS — J449 Chronic obstructive pulmonary disease, unspecified: Secondary | ICD-10-CM | POA: Diagnosis not present

## 2016-05-25 DIAGNOSIS — S7001XA Contusion of right hip, initial encounter: Secondary | ICD-10-CM | POA: Insufficient documentation

## 2016-05-25 DIAGNOSIS — Y939 Activity, unspecified: Secondary | ICD-10-CM | POA: Diagnosis not present

## 2016-05-25 DIAGNOSIS — I129 Hypertensive chronic kidney disease with stage 1 through stage 4 chronic kidney disease, or unspecified chronic kidney disease: Secondary | ICD-10-CM | POA: Diagnosis not present

## 2016-05-25 DIAGNOSIS — N183 Chronic kidney disease, stage 3 (moderate): Secondary | ICD-10-CM | POA: Insufficient documentation

## 2016-05-25 DIAGNOSIS — W1830XA Fall on same level, unspecified, initial encounter: Secondary | ICD-10-CM | POA: Insufficient documentation

## 2016-05-25 DIAGNOSIS — Z955 Presence of coronary angioplasty implant and graft: Secondary | ICD-10-CM | POA: Insufficient documentation

## 2016-05-25 DIAGNOSIS — E039 Hypothyroidism, unspecified: Secondary | ICD-10-CM | POA: Insufficient documentation

## 2016-05-25 DIAGNOSIS — Y999 Unspecified external cause status: Secondary | ICD-10-CM | POA: Diagnosis not present

## 2016-05-25 DIAGNOSIS — Y92002 Bathroom of unspecified non-institutional (private) residence single-family (private) house as the place of occurrence of the external cause: Secondary | ICD-10-CM | POA: Insufficient documentation

## 2016-05-25 DIAGNOSIS — S79911A Unspecified injury of right hip, initial encounter: Secondary | ICD-10-CM | POA: Diagnosis present

## 2016-05-25 DIAGNOSIS — I2510 Atherosclerotic heart disease of native coronary artery without angina pectoris: Secondary | ICD-10-CM | POA: Insufficient documentation

## 2016-05-25 NOTE — ED Notes (Signed)
Patient returned from X-ray 

## 2016-05-25 NOTE — ED Triage Notes (Signed)
Per EMS pt is a resident at [facility] found pt in the bathroom on the floor  Unsure how long pt had been there  Staff ambulated pt back to bed and then called EMS  Pt has a bruise noted on her right leg  Pt is currently on blood thinners  Pt denies hitting her head, neck, or back

## 2016-05-25 NOTE — ED Notes (Signed)
Pt complaining of pain in her legs

## 2016-05-25 NOTE — ED Notes (Signed)
[facility] called for transport.  

## 2016-05-25 NOTE — ED Notes (Signed)
Patient transported to X-ray 

## 2016-05-25 NOTE — ED Notes (Signed)
Report given to staff at [facility] and they are aware that pt is returning.

## 2016-05-25 NOTE — ED Provider Notes (Signed)
[facility] DEPT Provider Note   CSN: [id number] Arrival date & time: 05/25/16  0502     History   Chief Complaint Chief Complaint  Patient presents with  . Fall    HPI [name] is a 83 y.o. female.  Patient brought to the emergency department by ambulance. Patient was found by staff laying on the ground in the bathroom. She was complaining of pain in the right hip area upon arrival of EMS and at arrival to the ER. Patient denies headache, neck pain, back pain.      Past Medical History:  Diagnosis Date  . Alzheimer disease prolapsed bladder  . Anxiety   . Atrial fibrillation ([place])   . CAD (coronary artery disease), native coronary artery    H/O PTCA, balloon angioplasty Mid LAD in 2001, 4.0x18 vision in proximal RCA 3.2010 and cardiac cath 01/2009 revealed patent PCI sites  . Chronic kidney disease, stage III (moderate)   . COPD (chronic obstructive pulmonary disease) ([place])   . Dyslipidemia   . GERD (gastroesophageal reflux disease)   . H/O: GI bleed    Massive rectal bleed 2008. Not a coumadin candidate. A. Fib without recurrence.    Marland Kitchen Hx: UTI (urinary tract infection)   . Hypertension   . Hypothyroid     Patient Active Problem List   Diagnosis Date Noted  . AKI (acute kidney injury) ([place]) 01/10/2015  . Atrial fibrillation ([place]) 01/09/2015  . Pain in the chest   . Fall 03/21/2011  . Hypokalemia 03/18/2011  . CAD (coronary artery disease) 03/18/2011  . Stented coronary artery 03/18/2011  . Atrial fibrillation with RVR ([place])   . Alzheimer disease   . Anxiety   . Hypertension   . COPD (chronic obstructive pulmonary disease) ([place])   . Chronic kidney disease, stage III (moderate)   . GERD (gastroesophageal reflux disease)   . Dyslipidemia   . Hypothyroid     Past Surgical History:  Procedure Laterality Date  . ABDOMINAL HYSTERECTOMY    . CORONARY ANGIOPLASTY WITH STENT PLACEMENT  bladder tacking  . myocardial infarct    . TOTAL VAGINAL  HYSTERECTOMY      OB History    Gravida Para Term Preterm AB Living   4 4 4     4    SAB TAB Ectopic Multiple Live Births                   Home Medications    Prior to Admission medications   Medication Sig Start Date End Date Taking? Authorizing Provider  acetaminophen (TYLENOL) 650 MG CR tablet Take 650 mg by mouth every 8 (eight) hours as needed for pain (pain).     Historical Provider, MD  ALPRAZolam Duanne Moron) 0.25 MG tablet Take 0.25 mg by mouth every 8 (eight) hours as needed for anxiety.    Historical Provider, MD  alum & mag hydroxide-simeth (MAALOX/MYLANTA) 200-200-20 MG/5ML suspension Take 30 mLs by mouth every 6 (six) hours as needed for indigestion or heartburn.    Historical Provider, MD  benzocaine (ORAJEL) 10 % mucosal gel Use as directed 1 application in the mouth or throat as needed for mouth pain. Right gum    Historical Provider, MD  Cholecalciferol (VITAMIN D-3) 5000 UNITS TABS Take 1 tablet by mouth daily.    Historical Provider, MD  citalopram (CELEXA) 10 MG tablet Take 10 mg by mouth daily.      Historical Provider, MD  clopidogrel (PLAVIX) 75 MG tablet Take 75 mg  by mouth daily.    Historical Provider, MD  diltiazem (DILACOR XR) 180 MG 24 hr capsule Take 1 capsule (180 mg total) by mouth daily. 01/11/15   Florinda Marker, MD  docusate sodium (COLACE) 100 MG capsule Take 100 mg by mouth daily.    Historical Provider, MD  furosemide (LASIX) 20 MG tablet Take 20 mg by mouth daily.    Historical Provider, MD  guaifenesin (ROBITUSSIN) 100 MG/5ML syrup Take 200 mg by mouth every 6 (six) hours as needed for cough.     Historical Provider, MD  HYDROcodone-acetaminophen (NORCO/VICODIN) 5-325 MG per tablet Take 1 tablet by mouth every 4 (four) hours as needed for moderate pain or severe pain.    Historical Provider, MD  hydrocortisone cream 1 % Apply 1 application topically 2 (two) times daily as needed for itching.    Historical Provider, MD  isosorbide mononitrate (IMDUR) 30  MG 24 hr tablet Take 30 mg by mouth daily.      Historical Provider, MD  levothyroxine (SYNTHROID, LEVOTHROID) 75 MCG tablet Take 37.5 mcg by mouth daily before breakfast.    Historical Provider, MD  loperamide (IMODIUM) 2 MG capsule Take 2 mg by mouth as needed for diarrhea or loose stools.    Historical Provider, MD  magnesium hydroxide (MILK OF MAGNESIA) 400 MG/5ML suspension Take 30 mLs by mouth at bedtime as needed for mild constipation.     Historical Provider, MD  Memantine HCl ER (NAMENDA XR) 28 MG CP24 Take 28 mg by mouth daily.    Historical Provider, MD  neomycin-bacitracin-polymyxin (NEOSPORIN) ointment Apply 1 application topically every 12 (twelve) hours. As needed for skin tears    Historical Provider, MD  nitroGLYCERIN (NITROSTAT) 0.4 MG SL tablet Place 0.4 mg under the tongue every 5 (five) minutes as needed for chest pain.    Historical Provider, MD  potassium chloride (K-DUR,KLOR-CON) 10 MEQ tablet Take 10 mEq by mouth daily.    Historical Provider, MD  protein supplement (RESOURCE BENEPROTEIN) POWD Take 1 scoop by mouth daily.     Historical Provider, MD  quinapril (ACCUPRIL) 40 MG tablet Take 40 mg by mouth daily.     Historical Provider, MD  traMADol (ULTRAM) 50 MG tablet Take 50 mg by mouth every 6 (six) hours as needed for moderate pain or severe pain.    Historical Provider, MD    Family History No family history on file.  Social History Social History  Substance Use Topics  . Smoking status: Former Smoker    Quit date: 03/18/1959  . Smokeless tobacco: Never Used  . Alcohol use No     Allergies   Codeine and Tuberculin tests   Review of Systems Review of Systems  Unable to perform ROS: Dementia  Musculoskeletal: Positive for arthralgias.     Physical Exam Updated Vital Signs BP 100/68   Pulse 88   Ht 5\' 3"  (1.6 m)   Wt 137 lb (62.1 kg)   SpO2 94%   BMI 24.27 kg/m   Physical Exam  Constitutional: She appears well-developed and well-nourished. No  distress.  HENT:  Head: Normocephalic and atraumatic.  Right Ear: Hearing normal.  Left Ear: Hearing normal.  Nose: Nose normal.  Mouth/Throat: Oropharynx is clear and moist and mucous membranes are normal.  Eyes: Conjunctivae and EOM are normal. Pupils are equal, round, and reactive to light.  Neck: Normal range of motion. Neck supple.  Cardiovascular: Regular rhythm, S1 normal and S2 normal.  Exam reveals no gallop and  no friction rub.   No murmur heard. Pulmonary/Chest: Effort normal and breath sounds normal. No respiratory distress. She exhibits no tenderness.  Abdominal: Soft. Normal appearance and bowel sounds are normal. There is no hepatosplenomegaly. There is no tenderness. There is no rebound, no guarding, no tenderness at McBurney's point and negative Murphy's sign. No hernia.  Musculoskeletal: Normal range of motion.       Right hip: She exhibits tenderness. She exhibits no deformity.  Neurological: She is alert. She has normal strength. No cranial nerve deficit or sensory deficit. Coordination normal. GCS eye subscore is 4. GCS verbal subscore is 5. GCS motor subscore is 6.  Skin: Skin is warm, dry and intact. No rash noted. No cyanosis.  Psychiatric: She has a normal mood and affect. Her speech is normal and behavior is normal. Thought content normal.  Nursing note and vitals reviewed.    ED Treatments / Results  Labs (all labs ordered are listed, but only abnormal results are displayed) Labs Reviewed - No data to display  EKG  EKG Interpretation None       Radiology Dg Hips Bilat W Or Wo Pelvis 3-4 Views  Result Date: 05/25/2016 CLINICAL DATA:  Status post fall.  Right hip pain. EXAM: DG HIP (WITH OR WITHOUT PELVIS) 3-4V BILAT COMPARISON:  None. FINDINGS: No acute fracture or dislocation of the hips is visualized. There is mild osteoarthrosis. Bilateral femoral artery calcifications. IMPRESSION: No displaced hip fracture. In patients of this age group, MRI is much  more sensitive for the detection of nondisplaced fractures and should be considered if there is clinical concern for an acute fracture. Electronically Signed   By: Ulyses Jarred M.D.   On: 05/25/2016 06:02    Procedures Procedures (including critical care time)  Medications Ordered in ED Medications - No data to display   Initial Impression / Assessment and Plan / ED Course  I have reviewed the triage vital signs and the nursing notes.  Pertinent labs & imaging results that were available during my care of the patient were reviewed by me and considered in my medical decision making (see chart for details).     Patient presents to the emergency department after unwitnessed fall. Patient was found on the ground. She initially complained of right hip pain. For me she complained of left hip pain. X-ray of both hips is negative. Patient was able to stand and bear weight, take steps on both hips without any discomfort. No concern for occult fracture. Patient has been awake, alert, no evidence of injury. Appropriate for return to nursing facility.  Final Clinical Impressions(s) / ED Diagnoses   Final diagnoses:  Contusion of right hip, initial encounter    New Prescriptions New Prescriptions   No medications on file     Orpah Greek, MD 05/25/16 0710

## 2016-05-30 ENCOUNTER — Encounter (HOSPITAL_COMMUNITY): Payer: Self-pay | Admitting: Emergency Medicine

## 2016-05-30 ENCOUNTER — Emergency Department (HOSPITAL_COMMUNITY): Payer: Medicare Other

## 2016-05-30 ENCOUNTER — Observation Stay (HOSPITAL_COMMUNITY)
Admission: EM | Admit: 2016-05-30 | Discharge: 2016-05-31 | Disposition: A | Discharge status: Home / Self Care | Payer: Medicare Other | Attending: Internal Medicine | Admitting: Internal Medicine

## 2016-05-30 DIAGNOSIS — E860 Dehydration: Secondary | ICD-10-CM | POA: Diagnosis not present

## 2016-05-30 DIAGNOSIS — Z888 Allergy status to other drugs, medicaments and biological substances status: Secondary | ICD-10-CM | POA: Diagnosis not present

## 2016-05-30 DIAGNOSIS — I252 Old myocardial infarction: Secondary | ICD-10-CM | POA: Diagnosis not present

## 2016-05-30 DIAGNOSIS — Z87891 Personal history of nicotine dependence: Secondary | ICD-10-CM | POA: Diagnosis not present

## 2016-05-30 DIAGNOSIS — Z90710 Acquired absence of both cervix and uterus: Secondary | ICD-10-CM | POA: Diagnosis not present

## 2016-05-30 DIAGNOSIS — I4891 Unspecified atrial fibrillation: Secondary | ICD-10-CM | POA: Insufficient documentation

## 2016-05-30 DIAGNOSIS — N183 Chronic kidney disease, stage 3 unspecified: Secondary | ICD-10-CM | POA: Diagnosis present

## 2016-05-30 DIAGNOSIS — N179 Acute kidney failure, unspecified: Secondary | ICD-10-CM | POA: Diagnosis not present

## 2016-05-30 DIAGNOSIS — J449 Chronic obstructive pulmonary disease, unspecified: Secondary | ICD-10-CM | POA: Diagnosis not present

## 2016-05-30 DIAGNOSIS — M1990 Unspecified osteoarthritis, unspecified site: Secondary | ICD-10-CM | POA: Diagnosis not present

## 2016-05-30 DIAGNOSIS — Z885 Allergy status to narcotic agent status: Secondary | ICD-10-CM | POA: Diagnosis not present

## 2016-05-30 DIAGNOSIS — G309 Alzheimer's disease, unspecified: Secondary | ICD-10-CM

## 2016-05-30 DIAGNOSIS — Z79899 Other long term (current) drug therapy: Secondary | ICD-10-CM | POA: Insufficient documentation

## 2016-05-30 DIAGNOSIS — E875 Hyperkalemia: Secondary | ICD-10-CM | POA: Diagnosis not present

## 2016-05-30 DIAGNOSIS — I129 Hypertensive chronic kidney disease with stage 1 through stage 4 chronic kidney disease, or unspecified chronic kidney disease: Secondary | ICD-10-CM | POA: Insufficient documentation

## 2016-05-30 DIAGNOSIS — I482 Chronic atrial fibrillation: Secondary | ICD-10-CM

## 2016-05-30 DIAGNOSIS — E039 Hypothyroidism, unspecified: Secondary | ICD-10-CM | POA: Diagnosis not present

## 2016-05-30 DIAGNOSIS — I2510 Atherosclerotic heart disease of native coronary artery without angina pectoris: Secondary | ICD-10-CM | POA: Insufficient documentation

## 2016-05-30 DIAGNOSIS — K219 Gastro-esophageal reflux disease without esophagitis: Secondary | ICD-10-CM | POA: Insufficient documentation

## 2016-05-30 DIAGNOSIS — Z955 Presence of coronary angioplasty implant and graft: Secondary | ICD-10-CM | POA: Diagnosis not present

## 2016-05-30 DIAGNOSIS — E785 Hyperlipidemia, unspecified: Secondary | ICD-10-CM | POA: Diagnosis not present

## 2016-05-30 DIAGNOSIS — N189 Chronic kidney disease, unspecified: Secondary | ICD-10-CM

## 2016-05-30 DIAGNOSIS — F0280 Dementia in other diseases classified elsewhere without behavioral disturbance: Secondary | ICD-10-CM | POA: Insufficient documentation

## 2016-05-30 DIAGNOSIS — Z87440 Personal history of urinary (tract) infections: Secondary | ICD-10-CM | POA: Insufficient documentation

## 2016-05-30 LAB — CBC WITH DIFFERENTIAL/PLATELET
Basophils Absolute: 0 10*3/uL (ref 0.0–0.1)
Basophils Relative: 0 %
EOS ABS: 0.1 10*3/uL (ref 0.0–0.7)
Eosinophils Relative: 1 %
HCT: 33 % — ABNORMAL LOW (ref 36.0–46.0)
HEMOGLOBIN: 11 g/dL — AB (ref 12.0–15.0)
LYMPHS ABS: 1.3 10*3/uL (ref 0.7–4.0)
Lymphocytes Relative: 16 %
MCH: 27.8 pg (ref 26.0–34.0)
MCHC: 33.3 g/dL (ref 30.0–36.0)
MCV: 83.3 fL (ref 78.0–100.0)
Monocytes Absolute: 0.5 10*3/uL (ref 0.1–1.0)
Monocytes Relative: 6 %
NEUTROS PCT: 77 %
Neutro Abs: 6.4 10*3/uL (ref 1.7–7.7)
Platelets: 218 10*3/uL (ref 150–400)
RBC: 3.96 MIL/uL (ref 3.87–5.11)
RDW: 14.7 % (ref 11.5–15.5)
WBC: 8.2 10*3/uL (ref 4.0–10.5)

## 2016-05-30 LAB — URINALYSIS, ROUTINE W REFLEX MICROSCOPIC
Bilirubin Urine: NEGATIVE
Glucose, UA: NEGATIVE mg/dL
HGB URINE DIPSTICK: NEGATIVE
Ketones, ur: NEGATIVE mg/dL
Leukocytes, UA: NEGATIVE
NITRITE: NEGATIVE
Protein, ur: NEGATIVE mg/dL
SPECIFIC GRAVITY, URINE: 1.012 (ref 1.005–1.030)
pH: 5 (ref 5.0–8.0)

## 2016-05-30 LAB — PROCALCITONIN

## 2016-05-30 LAB — COMPREHENSIVE METABOLIC PANEL
ALT: 24 U/L (ref 14–54)
ANION GAP: 8 (ref 5–15)
AST: 22 U/L (ref 15–41)
Albumin: 4.5 g/dL (ref 3.5–5.0)
Alkaline Phosphatase: 85 U/L (ref 38–126)
BUN: 64 mg/dL — ABNORMAL HIGH (ref 6–20)
CHLORIDE: 104 mmol/L (ref 101–111)
CO2: 21 mmol/L — ABNORMAL LOW (ref 22–32)
CREATININE: 1.86 mg/dL — AB (ref 0.44–1.00)
Calcium: 9.9 mg/dL (ref 8.9–10.3)
GFR, EST AFRICAN AMERICAN: 27 mL/min — AB (ref >60)
GFR, EST NON AFRICAN AMERICAN: 23 mL/min — AB (ref >60)
Glucose, Bld: 128 mg/dL — ABNORMAL HIGH (ref 65–99)
POTASSIUM: 6.6 mmol/L — AB (ref 3.5–5.1)
Sodium: 133 mmol/L — ABNORMAL LOW (ref 135–145)
Total Bilirubin: 0.7 mg/dL (ref 0.3–1.2)
Total Protein: 7.4 g/dL (ref 6.5–8.1)

## 2016-05-30 LAB — I-STAT CG4 LACTIC ACID, ED: LACTIC ACID, VENOUS: 1.23 mmol/L (ref 0.5–1.9)

## 2016-05-30 LAB — BASIC METABOLIC PANEL
ANION GAP: 7 (ref 5–15)
ANION GAP: 7 (ref 5–15)
BUN: 62 mg/dL — ABNORMAL HIGH (ref 6–20)
BUN: 52 mg/dL — ABNORMAL HIGH (ref 6–20)
CO2: 21 mmol/L — ABNORMAL LOW (ref 22–32)
CO2: 21 mmol/L — ABNORMAL LOW (ref 22–32)
Calcium: 9.3 mg/dL (ref 8.9–10.3)
Calcium: 8.8 mg/dL — ABNORMAL LOW (ref 8.9–10.3)
Chloride: 105 mmol/L (ref 101–111)
Chloride: 112 mmol/L — ABNORMAL HIGH (ref 101–111)
Creatinine, Ser: 1.78 mg/dL — ABNORMAL HIGH (ref 0.44–1.00)
Creatinine, Ser: 1.49 mg/dL — ABNORMAL HIGH (ref 0.44–1.00)
GFR, EST AFRICAN AMERICAN: 28 mL/min — AB (ref >60)
GFR, EST AFRICAN AMERICAN: 35 mL/min — AB (ref >60)
GFR, EST NON AFRICAN AMERICAN: 24 mL/min — AB (ref >60)
GFR, EST NON AFRICAN AMERICAN: 30 mL/min — AB (ref >60)
GLUCOSE: 120 mg/dL — AB (ref 65–99)
GLUCOSE: 108 mg/dL — AB (ref 65–99)
Potassium: 6.5 mmol/L (ref 3.5–5.1)
Potassium: 4.6 mmol/L (ref 3.5–5.1)
Sodium: 133 mmol/L — ABNORMAL LOW (ref 135–145)
Sodium: 140 mmol/L (ref 135–145)

## 2016-05-30 MED ORDER — DOCUSATE SODIUM 100 MG PO CAPS
100.0000 mg | ORAL_CAPSULE | Freq: Every day | ORAL | Status: DC
  Administered 2016-05-31: 100 mg via ORAL
  Filled 2016-05-30: qty 1

## 2016-05-30 MED ORDER — CLOPIDOGREL BISULFATE 75 MG PO TABS
75.0000 mg | ORAL_TABLET | Freq: Every day | ORAL | Status: DC
  Administered 2016-05-31: 75 mg via ORAL
  Filled 2016-05-30: qty 1

## 2016-05-30 MED ORDER — SODIUM CHLORIDE 0.9% FLUSH: 3.0000 mL | INTRAVENOUS | Status: DC | PRN

## 2016-05-30 MED ORDER — CITALOPRAM HYDROBROMIDE 10 MG PO TABS
10.0000 mg | ORAL_TABLET | Freq: Every day | ORAL | Status: DC
  Administered 2016-05-31: 10 mg via ORAL
  Filled 2016-05-30: qty 1

## 2016-05-30 MED ORDER — DILTIAZEM HCL ER 180 MG PO CP24
180.0000 mg | ORAL_CAPSULE | Freq: Every day | ORAL | Status: DC
  Administered 2016-05-31: 180 mg via ORAL
  Filled 2016-05-30 (×2): qty 1

## 2016-05-30 MED ORDER — ACETAMINOPHEN 650 MG RE SUPP: 650.0000 mg | Freq: Four times a day (QID) | RECTAL | Status: DC | PRN

## 2016-05-30 MED ORDER — ENOXAPARIN SODIUM 30 MG/0.3ML ~~LOC~~ SOLN
30.0000 mg | SUBCUTANEOUS | Status: DC
  Administered 2016-05-30: 30 mg via SUBCUTANEOUS
  Filled 2016-05-30: qty 0.3

## 2016-05-30 MED ORDER — DEXTROSE 5 % IV SOLN
2.0000 g | INTRAVENOUS | Status: DC
  Filled 2016-05-30: qty 2

## 2016-05-30 MED ORDER — ACETAMINOPHEN 325 MG PO TABS: 650.0000 mg | ORAL_TABLET | Freq: Four times a day (QID) | ORAL | Status: DC | PRN

## 2016-05-30 MED ORDER — MEMANTINE HCL ER 28 MG PO CP24
28.0000 mg | ORAL_CAPSULE | Freq: Every day | ORAL | Status: DC
  Administered 2016-05-31: 28 mg via ORAL
  Filled 2016-05-30: qty 1

## 2016-05-30 MED ORDER — ONDANSETRON HCL 4 MG PO TABS: 4.0000 mg | ORAL_TABLET | Freq: Four times a day (QID) | ORAL | Status: DC | PRN

## 2016-05-30 MED ORDER — SODIUM CHLORIDE 0.9% FLUSH: 3.0000 mL | Freq: Two times a day (BID) | INTRAVENOUS | Status: DC

## 2016-05-30 MED ORDER — SODIUM CHLORIDE 0.9% FLUSH
3.0000 mL | Freq: Two times a day (BID) | INTRAVENOUS | Status: DC
  Administered 2016-05-30: 3 mL via INTRAVENOUS

## 2016-05-30 MED ORDER — SODIUM CHLORIDE 0.9 % IV SOLN: 250.0000 mL | INTRAVENOUS | Status: DC | PRN

## 2016-05-30 MED ORDER — DEXTROSE 5 % IV SOLN
500.0000 mg | INTRAVENOUS | Status: DC
  Filled 2016-05-30: qty 500

## 2016-05-30 MED ORDER — SODIUM POLYSTYRENE SULFONATE 15 GM/60ML PO SUSP
30.0000 g | Freq: Once | ORAL | Status: AC
  Administered 2016-05-30: 30 g via ORAL
  Filled 2016-05-30: qty 120

## 2016-05-30 MED ORDER — SODIUM CHLORIDE 0.9 % IV BOLUS (SEPSIS)
1000.0000 mL | Freq: Once | INTRAVENOUS | Status: AC
  Administered 2016-05-30: 1000 mL via INTRAVENOUS

## 2016-05-30 MED ORDER — SODIUM BICARBONATE 8.4 % IV SOLN
50.0000 meq | Freq: Once | INTRAVENOUS | Status: AC
  Administered 2016-05-30: 50 meq via INTRAVENOUS
  Filled 2016-05-30: qty 50

## 2016-05-30 MED ORDER — ONDANSETRON HCL 4 MG/2ML IJ SOLN: 4.0000 mg | Freq: Four times a day (QID) | INTRAMUSCULAR | Status: DC | PRN

## 2016-05-30 MED ORDER — LEVOTHYROXINE SODIUM 75 MCG PO TABS
37.5000 ug | ORAL_TABLET | Freq: Every day | ORAL | Status: DC
  Administered 2016-05-31: 37.5 ug via ORAL
  Filled 2016-05-30: qty 0.5

## 2016-05-30 MED ORDER — MONTELUKAST SODIUM 10 MG PO TABS
10.0000 mg | ORAL_TABLET | Freq: Every day | ORAL | Status: DC
  Administered 2016-05-30: 10 mg via ORAL
  Filled 2016-05-30: qty 1

## 2016-05-30 NOTE — Care Management Note (Signed)
Case Management Note  Patient Details  Name: [name] MRN: [id number] Date of Birth: [birth year]  Subjective/Objective:    UTI                Action/Plan: Discharge Planning: NCM attempted to speak to pt but she was not able to answer questions. Pt resides at [facility]. Contacted CSW with new referral. Plan is to return to ALF once medically ready.   PCP Sande Brothers MD  Expected Discharge Date:                Expected Discharge Plan:  Assisted Living / Rest Home  In-House Referral:  Clinical Social Work  Discharge planning Services  CM Consult  Post Acute Care Choice:  NA Choice offered to:  NA  DME Arranged:  N/A DME Agency:  NA  HH Arranged:  NA HH Agency:  NA  Status of Service:  Completed, signed off  If discussed at [place] of Stay Meetings, dates discussed:    Additional Comments:  Erenest Rasher, RN 05/30/2016, 3:29 PM

## 2016-05-30 NOTE — ED Triage Notes (Signed)
Per [facility] pt from [facility], pt smacked her hand on desk and her BP 70/44 per staff, 5 mins later 90/60. Pt hx of dementia. Recently treated for UTI, staff believes pt still has UTI.   Vitals are done once a month at facility. Feb BP 108/62. Jan BP 133/68 Dec 135/67

## 2016-05-30 NOTE — ED Provider Notes (Signed)
[facility] DEPT Provider Note   CSN: [id number] Arrival date & time: 05/30/16  1354     History   Chief Complaint Chief Complaint  Patient presents with  . Hypotension    HPI [name] is a 83 y.o. female hx of COPD, CKD, CAD, afib here with Hypotension. Patient is from a nursing home is demented at baseline. Patient recently finished a course of Bactrim for urinary tract infection. She was noted to be hypotensive with blood pressure in the 70s. Patient states that she feels fine and denies any chest pain or shortness of breath or abdominal pain. Patient was seen here several days ago for a fall and had unremarkable hip x-ray.  The history is provided by the patient.   Level V caveat- dementia   Past Medical History:  Diagnosis Date  . Alzheimer disease prolapsed bladder  . Anxiety   . Atrial fibrillation ([place])   . CAD (coronary artery disease), native coronary artery    H/O PTCA, balloon angioplasty Mid LAD in 2001, 4.0x18 vision in proximal RCA 3.2010 and cardiac cath 01/2009 revealed patent PCI sites  . Chronic kidney disease, stage III (moderate)   . COPD (chronic obstructive pulmonary disease) ([place])   . Dyslipidemia   . GERD (gastroesophageal reflux disease)   . H/O: GI bleed    Massive rectal bleed 2008. Not a coumadin candidate. A. Fib without recurrence.    Marland Kitchen Hx: UTI (urinary tract infection)   . Hypertension   . Hypothyroid     Patient Active Problem List   Diagnosis Date Noted  . AKI (acute kidney injury) ([place]) 01/10/2015  . Atrial fibrillation ([place]) 01/09/2015  . Pain in the chest   . Fall 03/21/2011  . Hypokalemia 03/18/2011  . CAD (coronary artery disease) 03/18/2011  . Stented coronary artery 03/18/2011  . Atrial fibrillation with RVR ([place])   . Alzheimer disease   . Anxiety   . Hypertension   . COPD (chronic obstructive pulmonary disease) ([place])   . Chronic kidney disease, stage III (moderate)   . GERD (gastroesophageal reflux disease)   .  Dyslipidemia   . Hypothyroid     Past Surgical History:  Procedure Laterality Date  . ABDOMINAL HYSTERECTOMY    . CORONARY ANGIOPLASTY WITH STENT PLACEMENT  bladder tacking  . myocardial infarct    . TOTAL VAGINAL HYSTERECTOMY      OB History    Gravida Para Term Preterm AB Living   4 4 4     4    SAB TAB Ectopic Multiple Live Births                   Home Medications    Prior to Admission medications   Medication Sig Start Date End Date Taking? Authorizing Provider  acetaminophen (TYLENOL) 500 MG tablet Take 1,000 mg by mouth every 6 (six) hours as needed.   Yes Historical Provider, MD  alum & mag hydroxide-simeth (MAALOX/MYLANTA) 200-200-20 MG/5ML suspension Take 30 mLs by mouth every 6 (six) hours as needed for indigestion or heartburn.   Yes Historical Provider, MD  Cholecalciferol (VITAMIN D-3) 5000 UNITS TABS Take 1 tablet by mouth daily.   Yes Historical Provider, MD  citalopram (CELEXA) 10 MG tablet Take 10 mg by mouth daily.     Yes Historical Provider, MD  clopidogrel (PLAVIX) 75 MG tablet Take 75 mg by mouth daily.   Yes Historical Provider, MD  diltiazem (DILACOR XR) 180 MG 24 hr capsule Take 1 capsule (180  mg total) by mouth daily. 01/11/15  Yes Alexa Angela Burke, MD  docusate sodium (COLACE) 100 MG capsule Take 100 mg by mouth daily.   Yes Historical Provider, MD  furosemide (LASIX) 20 MG tablet Take 20 mg by mouth daily.   Yes Historical Provider, MD  guaifenesin (ROBITUSSIN) 100 MG/5ML syrup Take 200 mg by mouth every 6 (six) hours as needed for cough.    Yes Historical Provider, MD  hydrocortisone cream 1 % Apply 1 application topically 2 (two) times daily as needed for itching.   Yes Historical Provider, MD  isosorbide mononitrate (IMDUR) 30 MG 24 hr tablet Take 30 mg by mouth daily.     Yes Historical Provider, MD  KLOR-CON SPRINKLE 10 MEQ CR capsule Take 10 mEq by mouth daily. 05/04/16  Yes Historical Provider, MD  levothyroxine (SYNTHROID, LEVOTHROID) 75 MCG tablet  Take 37.5 mcg by mouth daily before breakfast.   Yes Historical Provider, MD  loperamide (IMODIUM) 2 MG capsule Take 2 mg by mouth as needed for diarrhea or loose stools.   Yes Historical Provider, MD  magnesium hydroxide (MILK OF MAGNESIA) 400 MG/5ML suspension Take 30 mLs by mouth at bedtime as needed for mild constipation.    Yes Historical Provider, MD  Memantine HCl ER (NAMENDA XR) 28 MG CP24 Take 28 mg by mouth daily.   Yes Historical Provider, MD  montelukast (SINGULAIR) 10 MG tablet Take 10 mg by mouth at bedtime. 05/15/16  Yes Historical Provider, MD  neomycin-bacitracin-polymyxin (NEOSPORIN) ointment Apply 1 application topically every 12 (twelve) hours. As needed for skin tears   Yes Historical Provider, MD  quinapril (ACCUPRIL) 40 MG tablet Take 40 mg by mouth daily.    Yes Historical Provider, MD  nitroGLYCERIN (NITROSTAT) 0.4 MG SL tablet Place 0.4 mg under the tongue every 5 (five) minutes as needed for chest pain.    Historical Provider, MD    Family History No family history on file.  Social History Social History  Substance Use Topics  . Smoking status: Former Smoker    Quit date: 03/18/1959  . Smokeless tobacco: Never Used  . Alcohol use No     Allergies   Codeine and Tuberculin tests   Review of Systems Review of Systems  Neurological: Positive for weakness.  All other systems reviewed and are negative.    Physical Exam Updated Vital Signs BP 103/56 (BP Location: Right Arm)   Pulse 63   Temp 97.9 F (36.6 C) (Oral)   Resp 16   SpO2 93%   Physical Exam  Constitutional:  Chronically ill, demented   HENT:  Head: Normocephalic and atraumatic.  Eyes: EOM are normal. Pupils are equal, round, and reactive to light.  Neck: Normal range of motion. Neck supple.  Cardiovascular: Normal rate, regular rhythm and normal heart sounds.   Pulmonary/Chest: Effort normal and breath sounds normal. No respiratory distress. She has no wheezes.  Abdominal: Soft. Bowel  sounds are normal.  No obvious pulsatile mass, nontender   Musculoskeletal: Normal range of motion.  Neurological:  Demented, moving all extremities   Skin: Skin is warm.  Psychiatric:  Demented   Nursing note and vitals reviewed.    ED Treatments / Results  Labs (all labs ordered are listed, but only abnormal results are displayed) Labs Reviewed  COMPREHENSIVE METABOLIC PANEL - Abnormal; Notable for the following:       Result Value   Sodium 133 (*)    Potassium 6.6 (*)    CO2 21 (*)  Glucose, Bld 128 (*)    BUN 64 (*)    Creatinine, Ser 1.86 (*)    GFR calc non Af Amer 23 (*)    GFR calc Af Amer 27 (*)    All other components within normal limits  CBC WITH DIFFERENTIAL/PLATELET - Abnormal; Notable for the following:    Hemoglobin 11.0 (*)    HCT 33.0 (*)    All other components within normal limits  CULTURE, BLOOD (ROUTINE X 2)  CULTURE, BLOOD (ROUTINE X 2)  URINE CULTURE  URINALYSIS, ROUTINE W REFLEX MICROSCOPIC  PROCALCITONIN  I-STAT CG4 LACTIC ACID, ED  I-STAT CHEM 8, ED    EKG  EKG Interpretation  Date/Time:  Sunday May 30 2016 14:52:36 EST Ventricular Rate:  78 PR Interval:    QRS Duration: 82 QT Interval:  394 QTC Calculation: 469 R Axis:   72 Text Interpretation:  Atrial fibrillation Low voltage, precordial leads No significant change since last tracing Confirmed by Neetu Carrozza  MD, Almer Littleton ([id number]) on 05/30/2016 3:14:07 PM       Radiology Dg Chest Port 1 View  Result Date: 05/30/2016 CLINICAL DATA:  Altered mental status and hypotension. EXAM: PORTABLE CHEST 1 VIEW COMPARISON:  01/09/2015 and prior radiographs. FINDINGS: This is a low volume film. Mild left basilar atelectasis/scarring noted. There is no evidence of focal airspace disease, pulmonary edema, suspicious pulmonary nodule/mass, pleural effusion, or pneumothorax. No acute bony abnormalities are identified. IMPRESSION: Mild left basilar atelectasis/ scarring. No other acute abnormalities.  Electronically Signed   By: Margarette Canada M.D.   On: 05/30/2016 15:03    Procedures Procedures (including critical care time)  Medications Ordered in ED Medications  sodium chloride 0.9 % bolus 1,000 mL (not administered)    And  sodium chloride 0.9 % bolus 1,000 mL (1,000 mLs Intravenous New Bag/Given 05/30/16 1510)  cefTRIAXone (ROCEPHIN) 2 g in dextrose 5 % 50 mL IVPB (not administered)  azithromycin (ZITHROMAX) 500 mg in dextrose 5 % 250 mL IVPB (not administered)  sodium bicarbonate injection 50 mEq (not administered)     Initial Impression / Assessment and Plan / ED Course  I have reviewed the triage vital signs and the nursing notes.  Pertinent labs & imaging results that were available during my care of the patient were reviewed by me and considered in my medical decision making (see chart for details).     [name] is a 83 y.o. female here with hypotension, possible UTI. Patient demented, unable to give a history. States that she felt fine. Hypotensive in the nursing home. Code sepsis initiated. Will get labs, UA, CXR. Will give 30 cc/kg bolus.   4:44 PM WBC nl. UA nl. CXR possible pneumonia but procalcitonin negative. Given 30 cc/kg bolus and rocephin, azithro. BP was 80s initially, went up to 108/68. She does have hyperkalemia 6.6, no hemolysis. Also acute renal failure Cr 1.8. I called Dr. Melvia Heaps from nephrology. He thinks likely dehydration and from ACE and lasix use and will not recommend transferring patient to [facility]. If renal failure doesn't improve with IVF, then can get nephrology consult in the hospital.    Final Clinical Impressions(s) / ED Diagnoses   Final diagnoses:  None    New Prescriptions New Prescriptions   No medications on file     Drenda Freeze, MD 05/30/16 1652

## 2016-05-30 NOTE — H&P (Addendum)
History and Physical    [name] [id number] DOB: October 05, 1925 DOA: 05/30/2016    PCP: Sande Brothers, MD  Patient coming from: SNF  Chief Complaint: sent for BP of 70/44  HPI: [name] is a 83 y.o. female with medical history significant of Alzheimer's dementia, A-fib, CAD with stent in RCA 2010, CKD 3, hypothyroid sent for low BP as a code sepsis. She is confused and is only oriented to person. REcently treated for a UTI. The SNF staff though she still had a UTI  ED Course:   1 BP reading of [id number] and other systolic > 123XX123, BUN/ Cr 64 and 1.86. K is 6.6, Na 133  Review of Systems:  All other systems reviewed and apart from HPI, are negative.  Past Medical History:  Diagnosis Date  . Alzheimer disease prolapsed bladder  . Anxiety   . Atrial fibrillation ([place])   . CAD (coronary artery disease), native coronary artery    H/O PTCA, balloon angioplasty Mid LAD in 2001, 4.0x18 vision in proximal RCA 3.2010 and cardiac cath 01/2009 revealed patent PCI sites  . Chronic kidney disease, stage III (moderate)   . COPD (chronic obstructive pulmonary disease) ([place])   . Dyslipidemia   . GERD (gastroesophageal reflux disease)   . H/O: GI bleed    Massive rectal bleed 2008. Not a coumadin candidate. A. Fib without recurrence.    Marland Kitchen Hx: UTI (urinary tract infection)   . Hypertension   . Hypothyroid     Past Surgical History:  Procedure Laterality Date  . ABDOMINAL HYSTERECTOMY    . CORONARY ANGIOPLASTY WITH STENT PLACEMENT  bladder tacking  . myocardial infarct    . TOTAL VAGINAL HYSTERECTOMY      Social History:   reports that she quit smoking about 57 years ago. She has never used smokeless tobacco. She reports that she does not drink alcohol or use drugs.  Allergies  Allergen Reactions  . Codeine Nausea And Vomiting    Pt doesn't remember this reaction   . Tuberculin Tests     Unknown-MAR    No family history on file.  Patient has dementia, unable to tell me.     Prior to Admission medications   Medication Sig Start Date End Date Taking? Authorizing Provider  acetaminophen (TYLENOL) 500 MG tablet Take 1,000 mg by mouth every 6 (six) hours as needed.   Yes Historical Provider, MD  alum & mag hydroxide-simeth (MAALOX/MYLANTA) 200-200-20 MG/5ML suspension Take 30 mLs by mouth every 6 (six) hours as needed for indigestion or heartburn.   Yes Historical Provider, MD  Cholecalciferol (VITAMIN D-3) 5000 UNITS TABS Take 1 tablet by mouth daily.   Yes Historical Provider, MD  citalopram (CELEXA) 10 MG tablet Take 10 mg by mouth daily.     Yes Historical Provider, MD  clopidogrel (PLAVIX) 75 MG tablet Take 75 mg by mouth daily.   Yes Historical Provider, MD  diltiazem (DILACOR XR) 180 MG 24 hr capsule Take 1 capsule (180 mg total) by mouth daily. 01/11/15  Yes Alexa Angela Burke, MD  docusate sodium (COLACE) 100 MG capsule Take 100 mg by mouth daily.   Yes Historical Provider, MD  furosemide (LASIX) 20 MG tablet Take 20 mg by mouth daily.   Yes Historical Provider, MD  guaifenesin (ROBITUSSIN) 100 MG/5ML syrup Take 200 mg by mouth every 6 (six) hours as needed for cough.    Yes Historical Provider, MD  hydrocortisone cream 1 % Apply 1 application topically 2 (two)  times daily as needed for itching.   Yes Historical Provider, MD  isosorbide mononitrate (IMDUR) 30 MG 24 hr tablet Take 30 mg by mouth daily.     Yes Historical Provider, MD  KLOR-CON SPRINKLE 10 MEQ CR capsule Take 10 mEq by mouth daily. 05/04/16  Yes Historical Provider, MD  levothyroxine (SYNTHROID, LEVOTHROID) 75 MCG tablet Take 37.5 mcg by mouth daily before breakfast.   Yes Historical Provider, MD  loperamide (IMODIUM) 2 MG capsule Take 2 mg by mouth as needed for diarrhea or loose stools.   Yes Historical Provider, MD  magnesium hydroxide (MILK OF MAGNESIA) 400 MG/5ML suspension Take 30 mLs by mouth at bedtime as needed for mild constipation.    Yes Historical Provider, MD  Memantine HCl ER (NAMENDA  XR) 28 MG CP24 Take 28 mg by mouth daily.   Yes Historical Provider, MD  montelukast (SINGULAIR) 10 MG tablet Take 10 mg by mouth at bedtime. 05/15/16  Yes Historical Provider, MD  neomycin-bacitracin-polymyxin (NEOSPORIN) ointment Apply 1 application topically every 12 (twelve) hours. As needed for skin tears   Yes Historical Provider, MD  quinapril (ACCUPRIL) 40 MG tablet Take 40 mg by mouth daily.    Yes Historical Provider, MD  nitroGLYCERIN (NITROSTAT) 0.4 MG SL tablet Place 0.4 mg under the tongue every 5 (five) minutes as needed for chest pain.    Historical Provider, MD    Physical Exam: Vitals:   05/30/16 1545 05/30/16 1600 05/30/16 1615 05/30/16 1620  BP: (!) 81/55   108/68  Pulse:   (!) 47 86  Resp: 11 20 15 16   Temp:      TempSrc:      SpO2:   (!) 78% 98%      Constitutional: NAD, calm, comfortable Eyes: PERTLA, lids and conjunctivae normal ENMT: Mucous membranes are dry. Posterior pharynx clear of any exudate or lesions. Normal dentition.  Neck: normal, supple, no masses, no thyromegaly Respiratory: clear to auscultation bilaterally, no wheezing, no crackles. Normal respiratory effort. No accessory muscle use.  Cardiovascular: S1 & S2 heard, regular rate and rhythm, no murmurs / rubs / gallops. No extremity edema. 2+ pedal pulses. No carotid bruits.  Abdomen: No distension, no tenderness, no masses palpated. No hepatosplenomegaly. Bowel sounds normal.  Musculoskeletal: no clubbing / cyanosis. No joint deformity upper and lower extremities. Good ROM, no contractures. Normal muscle tone.  Skin: no rashes, lesions, ulcers. No induration Neurologic: CN 2-12 grossly intact. Sensation intact, DTR normal. Strength 5/5 in all 4 limbs.  Psychiatric: oriented only to person, calm, follows commands, mood appears normal.     Labs on Admission: I have personally reviewed following labs and imaging studies  CBC:  Recent Labs Lab 05/30/16 1507  WBC 8.2  NEUTROABS 6.4  HGB  11.0*  HCT 33.0*  MCV 83.3  PLT 99991111   Basic Metabolic Panel:  Recent Labs Lab 05/30/16 1507  NA 133*  K 6.6*  CL 104  CO2 21*  GLUCOSE 128*  BUN 64*  CREATININE 1.86*  CALCIUM 9.9   GFR: Estimated Creatinine Clearance: 17.3 mL/min (by C-G formula based on SCr of 1.86 mg/dL (H)). Liver Function Tests:  Recent Labs Lab 05/30/16 1507  AST 22  ALT 24  ALKPHOS 85  BILITOT 0.7  PROT 7.4  ALBUMIN 4.5   No results for input(s): LIPASE, AMYLASE in the last 168 hours. No results for input(s): AMMONIA in the last 168 hours. Coagulation Profile: No results for input(s): INR, PROTIME in the last 168 hours.  Cardiac Enzymes: No results for input(s): CKTOTAL, CKMB, CKMBINDEX, TROPONINI in the last 168 hours. BNP (last 3 results) No results for input(s): PROBNP in the last 8760 hours. HbA1C: No results for input(s): HGBA1C in the last 72 hours. CBG: No results for input(s): GLUCAP in the last 168 hours. Lipid Profile: No results for input(s): CHOL, HDL, LDLCALC, TRIG, CHOLHDL, LDLDIRECT in the last 72 hours. Thyroid Function Tests: No results for input(s): TSH, T4TOTAL, FREET4, T3FREE, THYROIDAB in the last 72 hours. Anemia Panel: No results for input(s): VITAMINB12, FOLATE, FERRITIN, TIBC, IRON, RETICCTPCT in the last 72 hours. Urine analysis:    Component Value Date/Time   COLORURINE YELLOW 05/30/2016 [place] 05/30/2016 1604   LABSPEC 1.012 05/30/2016 1604   PHURINE 5.0 05/30/2016 1604   GLUCOSEU NEGATIVE 05/30/2016 1604   HGBUR NEGATIVE 05/30/2016 1604   BILIRUBINUR NEGATIVE 05/30/2016 1604   KETONESUR NEGATIVE 05/30/2016 1604   PROTEINUR NEGATIVE 05/30/2016 1604   UROBILINOGEN 0.2 01/10/2015 0413   NITRITE NEGATIVE 05/30/2016 1604   LEUKOCYTESUR NEGATIVE 05/30/2016 1604   Sepsis Labs: @LABRCNTIP (procalcitonin:4,lacticidven:4) )No results found for this or any previous visit (from the past 240 hour(s)).   Radiological Exams on Admission: Dg  Chest Port 1 View  Result Date: 05/30/2016 CLINICAL DATA:  Altered mental status and hypotension. EXAM: PORTABLE CHEST 1 VIEW COMPARISON:  01/09/2015 and prior radiographs. FINDINGS: This is a low volume film. Mild left basilar atelectasis/scarring noted. There is no evidence of focal airspace disease, pulmonary edema, suspicious pulmonary nodule/mass, pleural effusion, or pneumothorax. No acute bony abnormalities are identified. IMPRESSION: Mild left basilar atelectasis/ scarring. No other acute abnormalities. Electronically Signed   By: Margarette Canada M.D.   On: 05/30/2016 15:03    EKG: Independently reviewed. A-fib at 78 bpm  Assessment/Plan Principal Problem:   Hyperkalemia- AKI- hypotension - stop Lasix, Quinapril, Imdur- cont Diltazem due to a-fib - last ECHO showed no signs of CHFans she was dehydrated at that time as well - would not give further Lasix - given 2 L NS in ER- will hold further IVF  - stop Potassium- give Kayexalate- f/u K at 10 PM tonight and again tomorrow  Active Problems:  A-fib - cont Cardizem- not on anticoagulation other than Plavix, I suspect due to falls. She was just in the ER on 2/20 with a fall.      Alzheimer disease- severe dementia on exam - on Namenda    Chronic kidney disease, stage III (moderate) - AKI as above- follow    Hypothyroid - Synthroid    CAD (coronary artery disease)   Stented RCA - Plavix - hold Imdur for now   Anxiety/ Depression? - cont Celexa    DVT prophylaxis: Lovenox Code Status: DNR  Family Communication: spoke with Daughter, Haydee Salter Disposition Plan: telemetry   Consults called: none  Admission status: observation-  If K and Cr improve, would discharge tomorrow.    [facility] MD [facility] Pager: [email or web address] Password [email or web address] 7PM-7AM, please contact night-coverage   05/30/2016, 5:07 PM

## 2016-05-30 NOTE — ED Notes (Signed)
Code sepsis was cancel at [facility] per Dr. Darl Householder.

## 2016-05-30 NOTE — Progress Notes (Signed)
Patient arrived from ED.  Alert to self only.  Room air.  Denies pain.  Patient had a bowel movement.  Telemetry applied, vital signs taken.  Patient has bruising to left leg, right leg, right hi[, right shoulder.  Patient reports no other needs at this time.

## 2016-05-31 DIAGNOSIS — G309 Alzheimer's disease, unspecified: Secondary | ICD-10-CM | POA: Diagnosis not present

## 2016-05-31 DIAGNOSIS — E875 Hyperkalemia: Secondary | ICD-10-CM | POA: Diagnosis not present

## 2016-05-31 DIAGNOSIS — I482 Chronic atrial fibrillation: Secondary | ICD-10-CM | POA: Diagnosis not present

## 2016-05-31 DIAGNOSIS — N179 Acute kidney failure, unspecified: Secondary | ICD-10-CM | POA: Diagnosis not present

## 2016-05-31 DIAGNOSIS — I2510 Atherosclerotic heart disease of native coronary artery without angina pectoris: Secondary | ICD-10-CM | POA: Diagnosis not present

## 2016-05-31 DIAGNOSIS — F0280 Dementia in other diseases classified elsewhere without behavioral disturbance: Secondary | ICD-10-CM | POA: Diagnosis not present

## 2016-05-31 DIAGNOSIS — E039 Hypothyroidism, unspecified: Secondary | ICD-10-CM | POA: Diagnosis not present

## 2016-05-31 DIAGNOSIS — N183 Chronic kidney disease, stage 3 (moderate): Secondary | ICD-10-CM | POA: Diagnosis not present

## 2016-05-31 LAB — BASIC METABOLIC PANEL
ANION GAP: 6 (ref 5–15)
BUN: 46 mg/dL — ABNORMAL HIGH (ref 6–20)
CO2: 23 mmol/L (ref 22–32)
Calcium: 9.1 mg/dL (ref 8.9–10.3)
Chloride: 110 mmol/L (ref 101–111)
Creatinine, Ser: 1.36 mg/dL — ABNORMAL HIGH (ref 0.44–1.00)
GFR calc Af Amer: 39 mL/min — ABNORMAL LOW (ref >60)
GFR calc non Af Amer: 34 mL/min — ABNORMAL LOW (ref >60)
GLUCOSE: 106 mg/dL — AB (ref 65–99)
Potassium: 4.5 mmol/L (ref 3.5–5.1)
Sodium: 139 mmol/L (ref 135–145)

## 2016-05-31 LAB — MRSA PCR SCREENING: MRSA by PCR: NEGATIVE

## 2016-05-31 NOTE — Discharge Summary (Signed)
Physician Discharge Summary  [name] EKC:[id number] DOB: 11/22/1925 DOA: 05/30/2016  PCP: Sande Brothers, MD  Admit date: 05/30/2016 Discharge date: 05/31/2016  Admitted From: ALF Disposition:  ALF  Recommendations for Outpatient Follow-up:  1. B met in 5 days-  2. BP daily- resume antihypertensive/ Cardiology meds as needed but do not resume Lasix and K  3. Follow oral intake of liquids closely  Discharge Condition:  stable   CODE STATUS:  DNR   Diet recommendation:  Regular diet Consultations:  none    Discharge Diagnoses:  Principal Problem:   Hyperkalemia Active Problems:   AKI (acute kidney injury) ([place])   Alzheimer disease   COPD (chronic obstructive pulmonary disease) (H[facility])   Chronic kidney disease, stage III (moderate)   Hypothyroid   CAD (coronary artery disease)   Stented coronary artery   Atrial fibrillation (H[facility])    Subjective: Very confused. No complaints. Per nursing, she has not had any issues  Brief Summary:  [name] is a 83 y.o. female with medical history significant of severe Alzheimer's dementia with no short term memory, very poor hearing, A-fib, CAD with stent in RCA 2010, CKD 3, hypothyroid sent for low BP as a code sepsis. She is confused and is only oriented to person. Recently treated for a UTI. The SNF staff thought she still had a UTI. UA is negative.   In ED, 1 BP reading of 81/55 and others with systolic > 505  BUN/ Cr 64 and 1.86. K is 6.6, Na 133   Hospital Course:  Principal Problem:   Hyperkalemia- AKI- hypotension- dehydration - stop Lasix with K replacements, Quinapril, Imdur due to hypotension, AKI  and hyperkalemia - cont Diltazem due to a-fib - last ECHO during admission on 10/16 showed no signs of CHF - she was dehydrated at that time as well and needed IVF in the hospital  - would not give further Lasix - given 2 L NS in ER- held further IVF  - stopped Potassium- give Kayexalate- - K+:  6.6 >> 4.5 today -  BUN/ Cr:   64/ 1.86 >> 46/1.36 - stable for d/c  Active Problems:  A-fib CHA2DS2-VASc Score 4 -  not on anticoagulation other than Plavix, I suspect due to falls. She was just in the ER on 2/20 with a fall.  - had 2 short bursts of rapid A-fib  - cont Cardizem- holding Imdur, maintain adequate hydration     Alzheimer disease- severe dementia on exam - on Namenda    Chronic kidney disease, stage III (moderate) - AKI as above- follow    Hypothyroid - Synthroid    CAD (coronary artery disease)   Stented RCA - Plavix - hold Imdur for now   Anxiety/ Depression? - cont Celexa     Discharge Instructions  Discharge Instructions    Diet general    Complete by:  As directed    Regular diet   Discharge instructions    Complete by:  As directed    B met in 5 days BP daily- resume antihypertensive/ Cardiology meds as needed Should have palliative care consult   Increase activity slowly    Complete by:  As directed      Allergies as of 05/31/2016      Reactions   Codeine Nausea And Vomiting   Pt doesn't remember this reaction    Tuberculin Tests    Unknown-MAR      Medication List    STOP taking these medications  furosemide 20 MG tablet Commonly known as:  LASIX   isosorbide mononitrate 30 MG 24 hr tablet Commonly known as:  IMDUR   KLOR-CON SPRINKLE 10 MEQ CR capsule Generic drug:  potassium chloride   quinapril 40 MG tablet Commonly known as:  ACCUPRIL     TAKE these medications   acetaminophen 500 MG tablet Commonly known as:  TYLENOL Take 1,000 mg by mouth every 6 (six) hours as needed.   alum & mag hydroxide-simeth 200-200-20 MG/5ML suspension Commonly known as:  MAALOX/MYLANTA Take 30 mLs by mouth every 6 (six) hours as needed for indigestion or heartburn.   citalopram 10 MG tablet Commonly known as:  CELEXA Take 10 mg by mouth daily.   clopidogrel 75 MG tablet Commonly known as:  PLAVIX Take 75 mg by mouth daily.   diltiazem 180 MG 24 hr  capsule Commonly known as:  DILACOR XR Take 1 capsule (180 mg total) by mouth daily.   docusate sodium 100 MG capsule Commonly known as:  COLACE Take 100 mg by mouth daily.   guaifenesin 100 MG/5ML syrup Commonly known as:  ROBITUSSIN Take 200 mg by mouth every 6 (six) hours as needed for cough.   hydrocortisone cream 1 % Apply 1 application topically 2 (two) times daily as needed for itching.   levothyroxine 75 MCG tablet Commonly known as:  SYNTHROID, LEVOTHROID Take 37.5 mcg by mouth daily before breakfast.   loperamide 2 MG capsule Commonly known as:  IMODIUM Take 2 mg by mouth as needed for diarrhea or loose stools.   magnesium hydroxide 400 MG/5ML suspension Commonly known as:  MILK OF MAGNESIA Take 30 mLs by mouth at bedtime as needed for mild constipation.   montelukast 10 MG tablet Commonly known as:  SINGULAIR Take 10 mg by mouth at bedtime.   NAMENDA XR 28 MG Cp24 24 hr capsule Generic drug:  memantine Take 28 mg by mouth daily.   neomycin-bacitracin-polymyxin ointment Commonly known as:  NEOSPORIN Apply 1 application topically every 12 (twelve) hours. As needed for skin tears   nitroGLYCERIN 0.4 MG SL tablet Commonly known as:  NITROSTAT Place 0.4 mg under the tongue every 5 (five) minutes as needed for chest pain.   Vitamin D-3 5000 units Tabs Take 1 tablet by mouth daily.       Allergies  Allergen Reactions  . Codeine Nausea And Vomiting    Pt doesn't remember this reaction   . Tuberculin Tests     Unknown-MAR     Procedures/Studies:   Dg Chest Port 1 View  Result Date: 05/30/2016 CLINICAL DATA:  Altered mental status and hypotension. EXAM: PORTABLE CHEST 1 VIEW COMPARISON:  01/09/2015 and prior radiographs. FINDINGS: This is a low volume film. Mild left basilar atelectasis/scarring noted. There is no evidence of focal airspace disease, pulmonary edema, suspicious pulmonary nodule/mass, pleural effusion, or pneumothorax. No acute bony  abnormalities are identified. IMPRESSION: Mild left basilar atelectasis/ scarring. No other acute abnormalities. Electronically Signed   By: Margarette Canada M.D.   On: 05/30/2016 15:03   Dg Hips Bilat W Or Wo Pelvis 3-4 Views  Result Date: 05/25/2016 CLINICAL DATA:  Status post fall.  Right hip pain. EXAM: DG HIP (WITH OR WITHOUT PELVIS) 3-4V BILAT COMPARISON:  None. FINDINGS: No acute fracture or dislocation of the hips is visualized. There is mild osteoarthrosis. Bilateral femoral artery calcifications. IMPRESSION: No displaced hip fracture. In patients of this age group, MRI is much more sensitive for the detection of nondisplaced fractures and  should be considered if there is clinical concern for an acute fracture. Electronically Signed   By: Ulyses Jarred M.D.   On: 05/25/2016 06:02        Discharge Exam: Vitals:   05/30/16 2051 05/31/16 0418  BP: 104/79 113/79  Pulse: 96 (!) 107  Resp: 18 18  Temp: 97.7 F (36.5 C) 98.3 F (36.8 C)   Vitals:   05/30/16 1904 05/30/16 2000 05/30/16 2051 05/31/16 0418  BP: 100/65  104/79 113/79  Pulse: (!) 114 93 96 (!) 107  Resp: 18  18 18   Temp: 98.1 F (36.7 C)  97.7 F (36.5 C) 98.3 F (36.8 C)  TempSrc: Oral  Oral Oral  SpO2: 94%  98% 98%    General: Pt is alert, awake, not in acute distress Cardiovascular: RRR, S1/S2 +, no rubs, no gallops Respiratory: CTA bilaterally, no wheezing, no rhonchi Abdominal: Soft, NT, ND, bowel sounds + Extremities: no edema, no cyanosis    The results of significant diagnostics from this hospitalization (including imaging, microbiology, ancillary and laboratory) are listed below for reference.     Microbiology: Recent Results (from the past 240 hour(s))  MRSA PCR Screening     Status: None   Collection Time: 05/31/16  5:00 AM  Result Value Ref Range Status   MRSA by PCR NEGATIVE NEGATIVE Final    Comment:        The GeneXpert MRSA Assay (FDA approved for NASAL specimens only), is one component of  a comprehensive MRSA colonization surveillance program. It is not intended to diagnose MRSA infection nor to guide or monitor treatment for MRSA infections.      Labs: BNP (last 3 results) No results for input(s): BNP in the last 8760 hours. Basic Metabolic Panel:  Recent Labs Lab 05/30/16 1507 05/30/16 1608 05/30/16 2200 05/31/16 0524  NA 133* 133* 140 139  K 6.6* 6.5* 4.6 4.5  CL 104 105 112* 110  CO2 21* 21* 21* 23  GLUCOSE 128* 120* 108* 106*  BUN 64* 62* 52* 46*  CREATININE 1.86* 1.78* 1.49* 1.36*  CALCIUM 9.9 9.3 8.8* 9.1   Liver Function Tests:  Recent Labs Lab 05/30/16 1507  AST 22  ALT 24  ALKPHOS 85  BILITOT 0.7  PROT 7.4  ALBUMIN 4.5   No results for input(s): LIPASE, AMYLASE in the last 168 hours. No results for input(s): AMMONIA in the last 168 hours. CBC:  Recent Labs Lab 05/30/16 1507  WBC 8.2  NEUTROABS 6.4  HGB 11.0*  HCT 33.0*  MCV 83.3  PLT 218   Cardiac Enzymes: No results for input(s): CKTOTAL, CKMB, CKMBINDEX, TROPONINI in the last 168 hours. BNP: Invalid input(s): POCBNP CBG: No results for input(s): GLUCAP in the last 168 hours. D-Dimer No results for input(s): DDIMER in the last 72 hours. Hgb A1c No results for input(s): HGBA1C in the last 72 hours. Lipid Profile No results for input(s): CHOL, HDL, LDLCALC, TRIG, CHOLHDL, LDLDIRECT in the last 72 hours. Thyroid function studies No results for input(s): TSH, T4TOTAL, T3FREE, THYROIDAB in the last 72 hours.  Invalid input(s): FREET3 Anemia work up No results for input(s): VITAMINB12, FOLATE, FERRITIN, TIBC, IRON, RETICCTPCT in the last 72 hours. Urinalysis    Component Value Date/Time   COLORURINE YELLOW 05/30/2016 [place] 05/30/2016 1604   LABSPEC 1.012 05/30/2016 1604   PHURINE 5.0 05/30/2016 [place] 05/30/2016 1604   HGBUR NEGATIVE 05/30/2016 [place] 05/30/2016 1604  KETONESUR NEGATIVE 05/30/2016 1604    PROTEINUR NEGATIVE 05/30/2016 1604   UROBILINOGEN 0.2 01/10/2015 0413   NITRITE NEGATIVE 05/30/2016 1604   LEUKOCYTESUR NEGATIVE 05/30/2016 1604   Sepsis Labs Invalid input(s): PROCALCITONIN,  WBC,  LACTICIDVEN Microbiology Recent Results (from the past 240 hour(s))  MRSA PCR Screening     Status: None   Collection Time: 05/31/16  5:00 AM  Result Value Ref Range Status   MRSA by PCR NEGATIVE NEGATIVE Final    Comment:        The GeneXpert MRSA Assay (FDA approved for NASAL specimens only), is one component of a comprehensive MRSA colonization surveillance program. It is not intended to diagnose MRSA infection nor to guide or monitor treatment for MRSA infections.      Time coordinating discharge: Over 30 minutes  SIGNED:   Debbe Odea, MD  [facility] 05/31/2016, 8:53 AM Pager   If 7PM-7AM, please contact night-coverage [email or web address] Password [email or web address]

## 2016-05-31 NOTE — Progress Notes (Signed)
Per patient daughter patient is a resident at [facility]. The plan is for the patient to return. Patient daughter unable to visit patient today due to her surgery tomorrow. LCSWA informed daughter CSW will assist with patient return and update her when patient is leaving to be transported.  Facility reports the patient was treated for UTI prior to admission. They requested patient be checked for UTI before discharging.  LCSWA will continue to assist with disposition.   Kathrin Greathouse, Latanya Presser, MSW Clinical Social Worker 5E and Psychiatric Service Line [phone] 05/31/2016  10:35 AM

## 2016-06-01 LAB — URINE CULTURE: Culture: NO GROWTH

## 2016-06-04 LAB — CULTURE, BLOOD (ROUTINE X 2)
Culture: NO GROWTH
Culture: NO GROWTH

## 2016-06-24 IMAGING — CT CT HEAD W/O CM
2 series · 16 of 30 positions shown, 20 images · non-contrast
Comparison: CT head without contrast 11/28/2013

CLINICAL DATA: Headache and fatigue. Increased lethargy. Pain on
the right side of the neck.

EXAM:
CT HEAD WITHOUT CONTRAST
TECHNIQUE: Contiguous axial images were obtained from the base of the skull
through the vertex without intravenous contrast.

[Series 2: head w/o · axial · non-contrast · 0.45mm/px · z∈[-239,-114]mm · 13 of 31 slices shown, 17 images]
[im 3/31  brain]
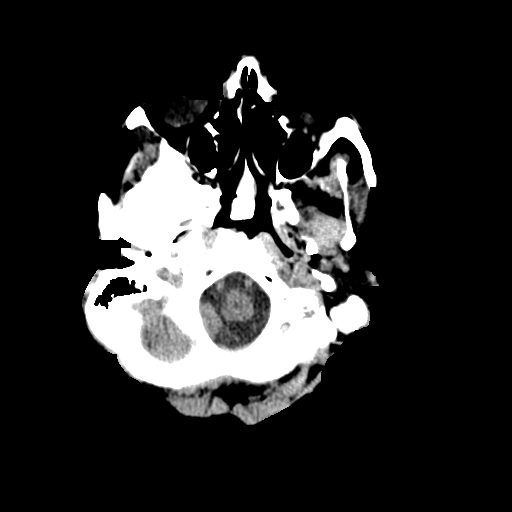
[im 3/31  bone]
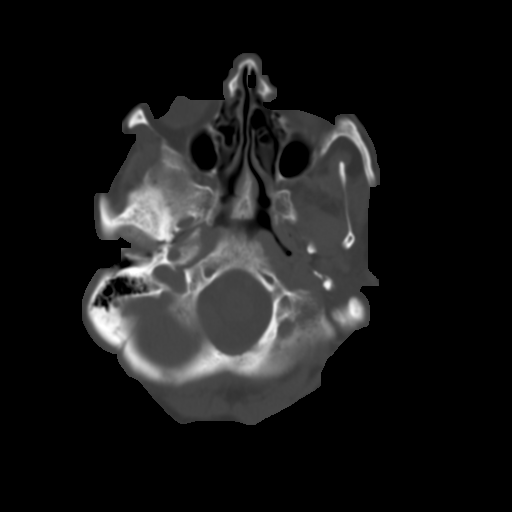
[im 5/31  brain]
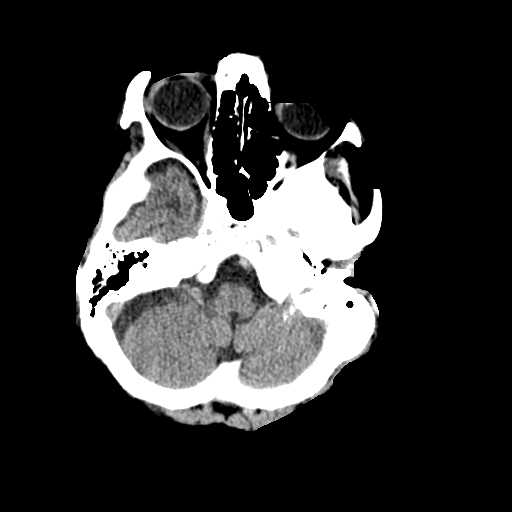
[im 7/31  brain]
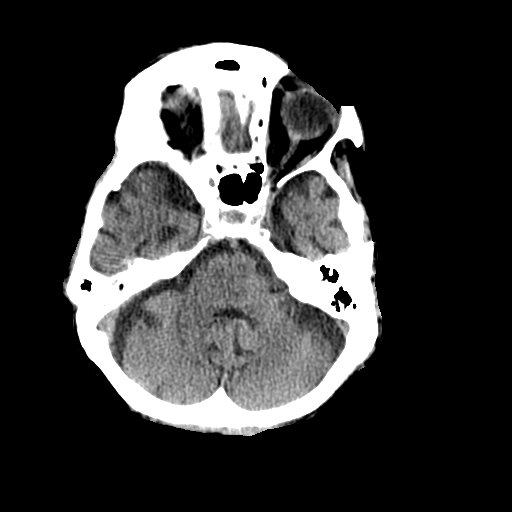
[im 9/31  brain]
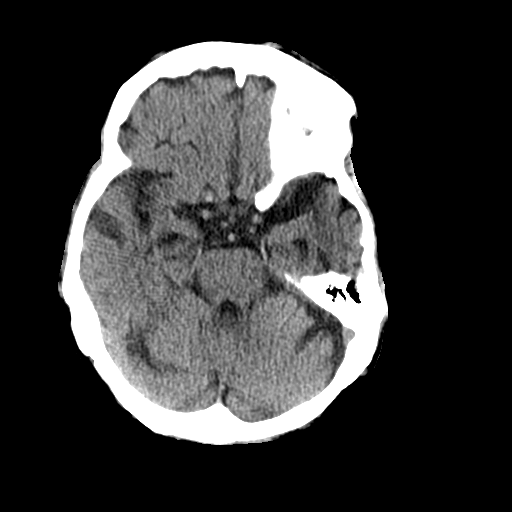
[im 11/31  brain]
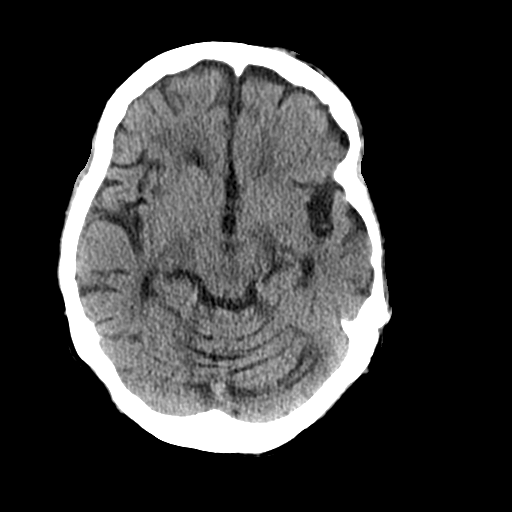
[im 11/31  bone]
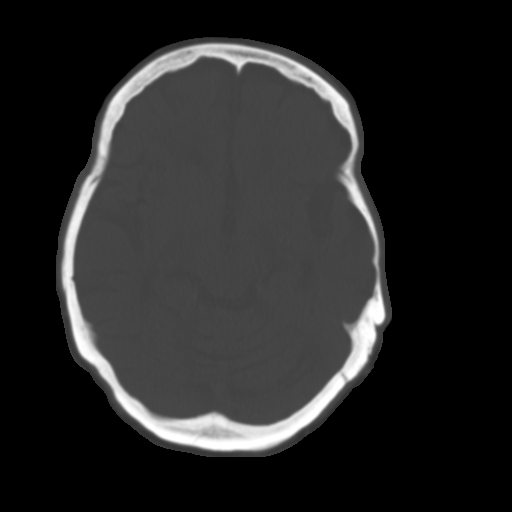
[im 13/31  brain]
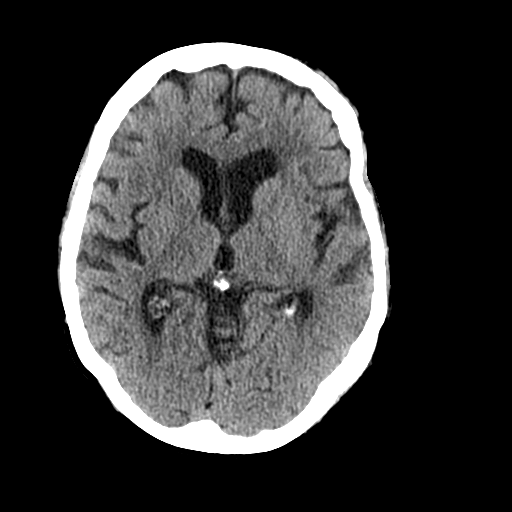
[im 16/31  brain]
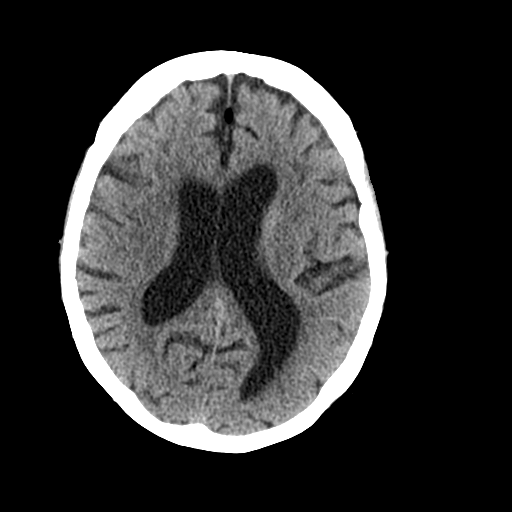
[im 18/31  brain]
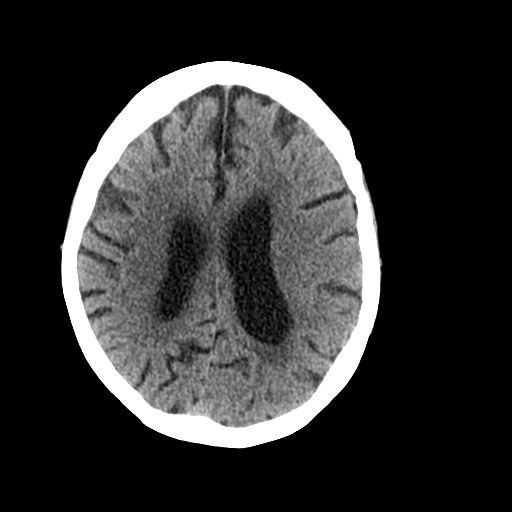
[im 20/31  brain]
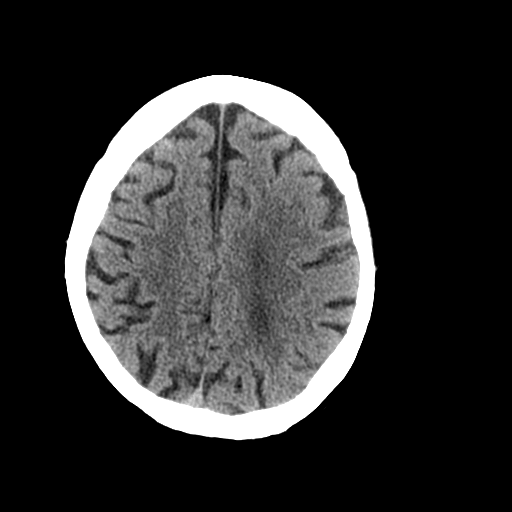
[im 20/31  bone]
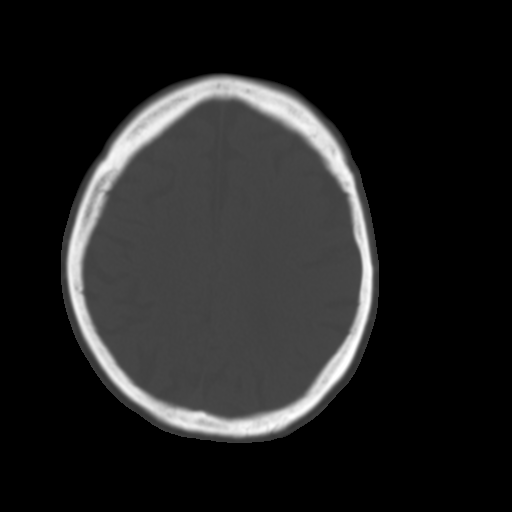
[im 22/31  brain]
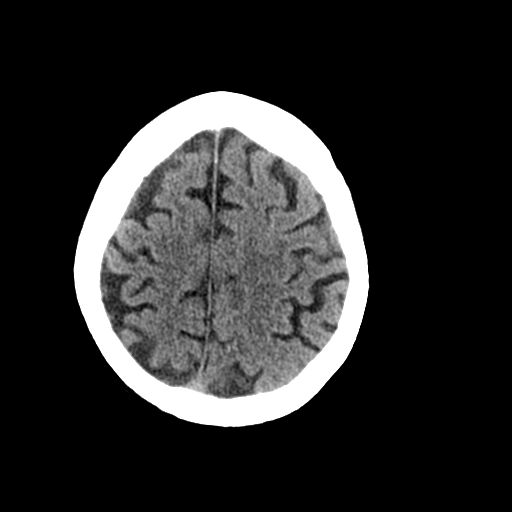
[im 24/31  brain]
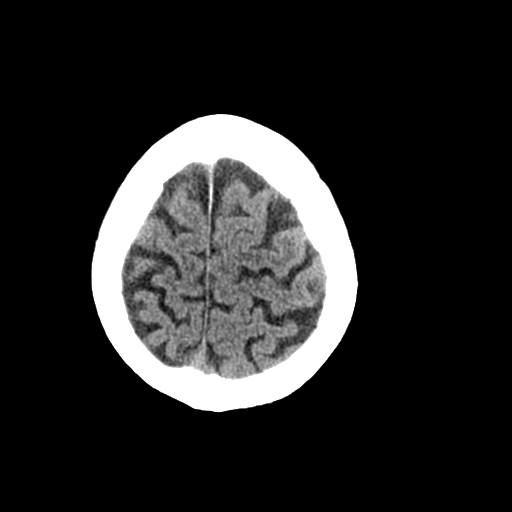
[im 26/31  brain]
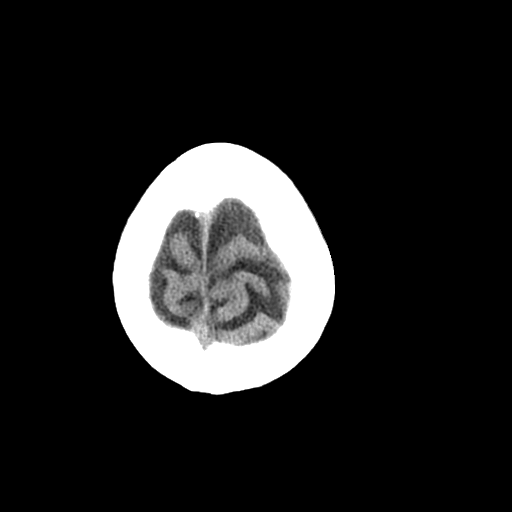
[im 28/31  brain]
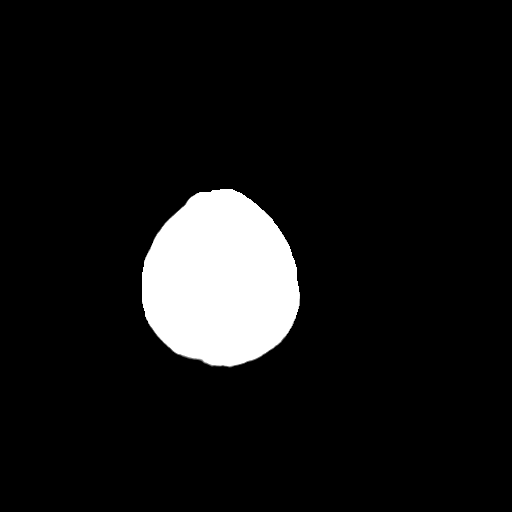
[im 28/31  bone]
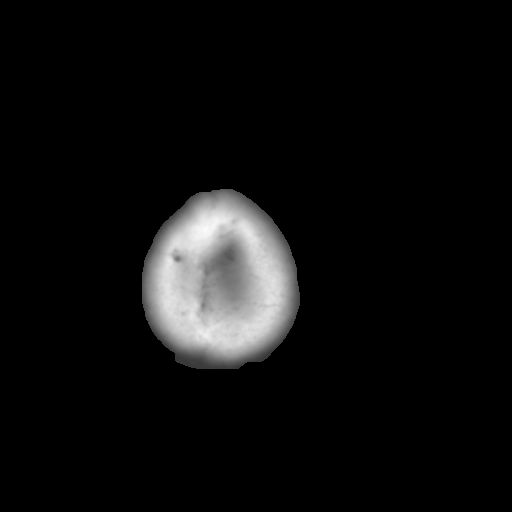

[Series 3: bone windows · axial · 0.45mm/px · z∈[-239,-199]mm · 3 of 31 slices shown]
[im 3/31  bone]
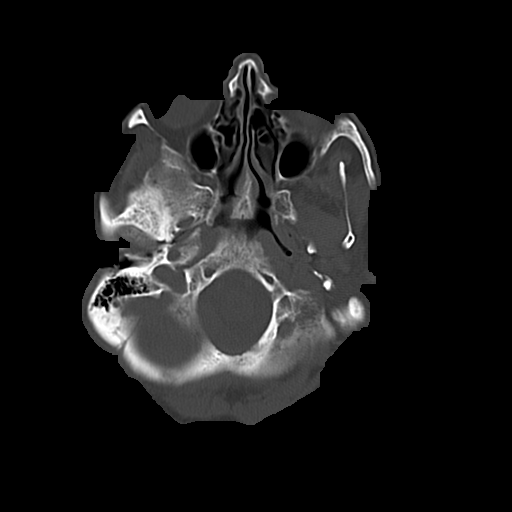
[im 7/31  bone]
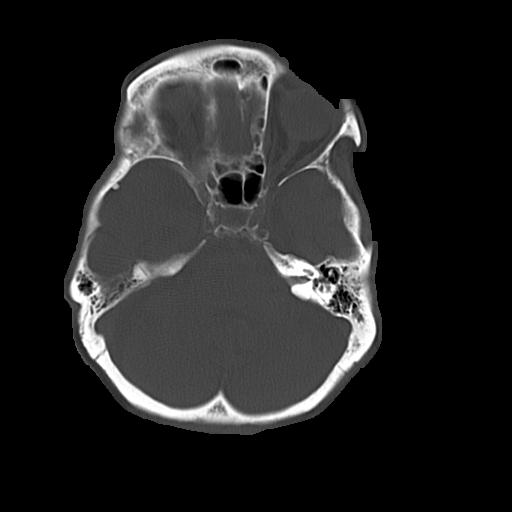
[im 11/31  bone]
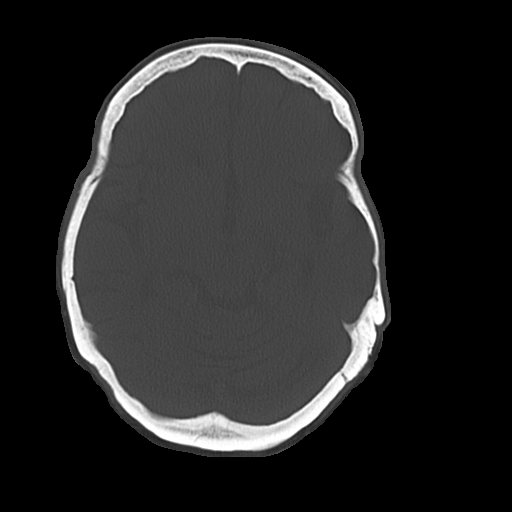

[16 of 30 positions shown; findings below may reference images not displayed]

FINDINGS: Moderate atrophy and white matter disease is stable. No acute
cortical infarct, hemorrhage, or mass lesion is present. The
ventricles are of normal size. No significant extra-axial fluid
collection is present.

The paranasal sinuses and mastoid air cells are clear. The osseous
skull is intact.
IMPRESSION: 1. No acute intracranial abnormality or significant interval change.
2. Stable atrophy and white matter disease.

## 2016-06-24 IMAGING — CR DG CHEST 2V
2 series · 2 of 2 positions shown · non-contrast
Comparison: 11/28/2013.

CLINICAL DATA: Chest pain.

EXAM:
CHEST  2 VIEW

[w chest lat]
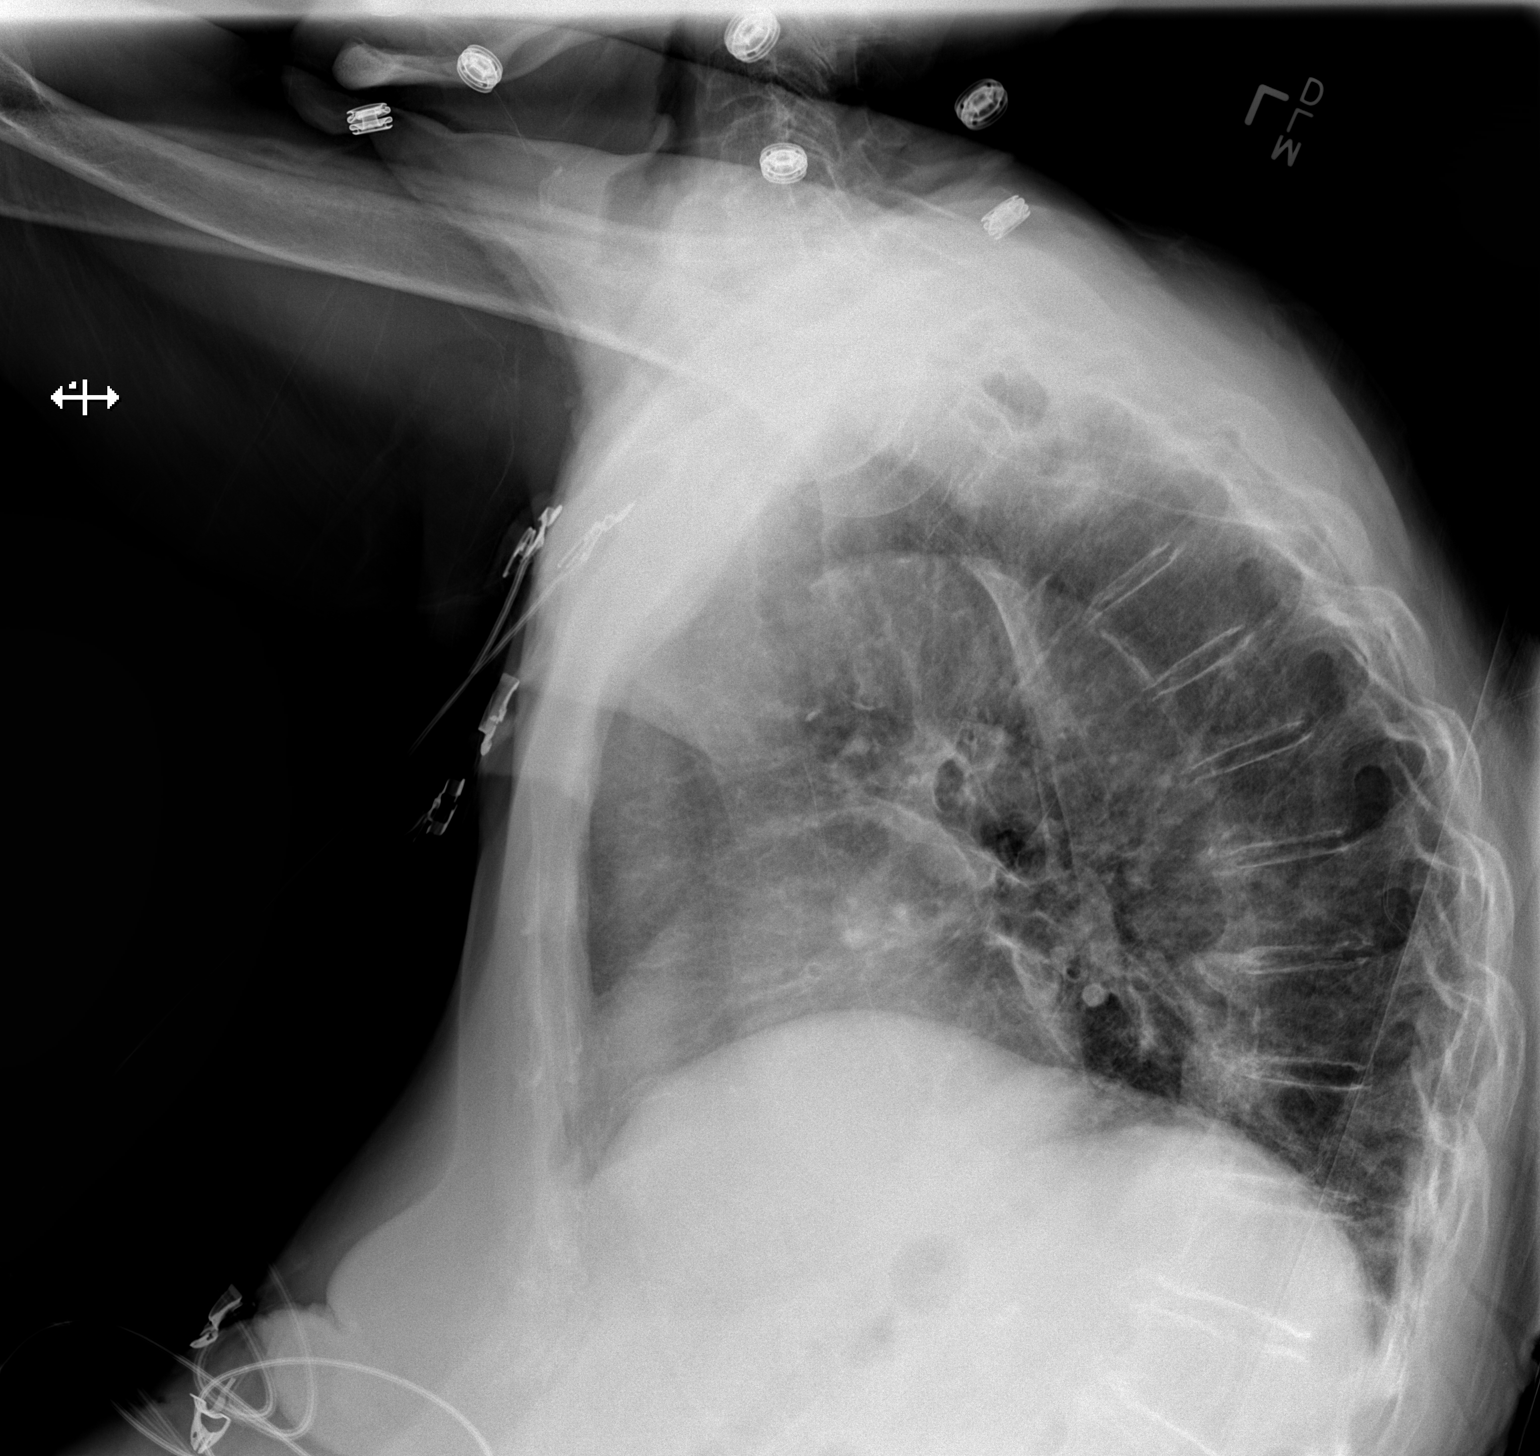

[x chest ap]
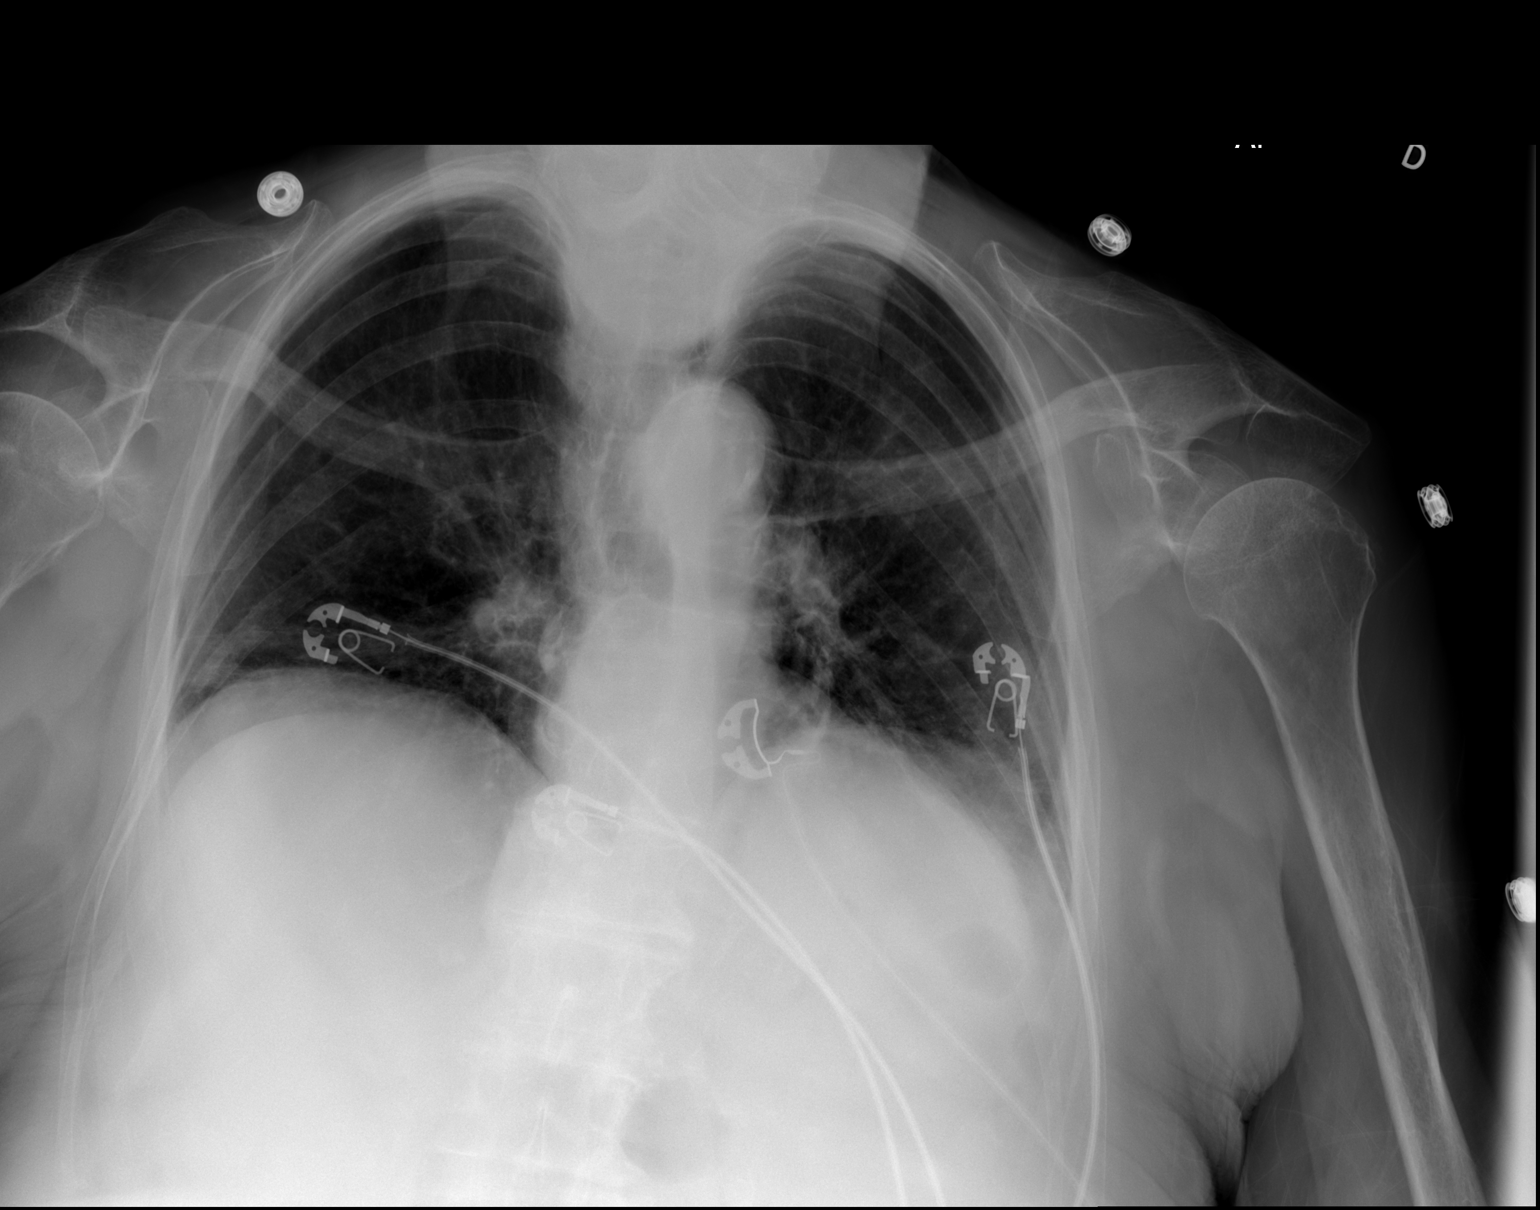

[2 of 2 positions shown; findings below may reference images not displayed]

FINDINGS: Mediastinum and hilar structures normal. Heart size normal. Mild
basilar atelectasis. No pleural effusion or pneumothorax.
Degenerative changes thoracic spine. No acute bony abnormality.
IMPRESSION: 1. Lung volumes with mild basilar atelectasis and/or scarring.
2. No acute cardiopulmonary disease otherwise noted.
3. Degenerative changes thoracic spine.

## 2017-03-05 ENCOUNTER — Encounter (HOSPITAL_COMMUNITY): Payer: Self-pay

## 2017-03-05 ENCOUNTER — Other Ambulatory Visit: Payer: Self-pay

## 2017-03-05 ENCOUNTER — Emergency Department (HOSPITAL_COMMUNITY): Payer: Medicare Other

## 2017-03-05 ENCOUNTER — Inpatient Hospital Stay (HOSPITAL_COMMUNITY)
Admission: EM | Admit: 2017-03-05 | Discharge: 2017-03-09 | DRG: 175 | Disposition: A | Discharge status: Nursing Home (Includes ICF) | Payer: Medicare Other | Attending: Internal Medicine | Admitting: Internal Medicine

## 2017-03-05 DIAGNOSIS — I2601 Septic pulmonary embolism with acute cor pulmonale: Secondary | ICD-10-CM | POA: Diagnosis not present

## 2017-03-05 DIAGNOSIS — E785 Hyperlipidemia, unspecified: Secondary | ICD-10-CM | POA: Diagnosis present

## 2017-03-05 DIAGNOSIS — D631 Anemia in chronic kidney disease: Secondary | ICD-10-CM | POA: Diagnosis present

## 2017-03-05 DIAGNOSIS — Z79899 Other long term (current) drug therapy: Secondary | ICD-10-CM

## 2017-03-05 DIAGNOSIS — Z90710 Acquired absence of both cervix and uterus: Secondary | ICD-10-CM

## 2017-03-05 DIAGNOSIS — Z87891 Personal history of nicotine dependence: Secondary | ICD-10-CM | POA: Diagnosis not present

## 2017-03-05 DIAGNOSIS — Z9181 History of falling: Secondary | ICD-10-CM

## 2017-03-05 DIAGNOSIS — R7989 Other specified abnormal findings of blood chemistry: Secondary | ICD-10-CM

## 2017-03-05 DIAGNOSIS — G309 Alzheimer's disease, unspecified: Secondary | ICD-10-CM | POA: Diagnosis present

## 2017-03-05 DIAGNOSIS — I252 Old myocardial infarction: Secondary | ICD-10-CM | POA: Diagnosis not present

## 2017-03-05 DIAGNOSIS — I129 Hypertensive chronic kidney disease with stage 1 through stage 4 chronic kidney disease, or unspecified chronic kidney disease: Secondary | ICD-10-CM | POA: Diagnosis present

## 2017-03-05 DIAGNOSIS — I2510 Atherosclerotic heart disease of native coronary artery without angina pectoris: Secondary | ICD-10-CM | POA: Diagnosis present

## 2017-03-05 DIAGNOSIS — I2699 Other pulmonary embolism without acute cor pulmonale: Principal | ICD-10-CM | POA: Diagnosis present

## 2017-03-05 DIAGNOSIS — N183 Chronic kidney disease, stage 3 unspecified: Secondary | ICD-10-CM | POA: Diagnosis present

## 2017-03-05 DIAGNOSIS — I4891 Unspecified atrial fibrillation: Secondary | ICD-10-CM | POA: Diagnosis present

## 2017-03-05 DIAGNOSIS — Z885 Allergy status to narcotic agent status: Secondary | ICD-10-CM | POA: Diagnosis not present

## 2017-03-05 DIAGNOSIS — Z66 Do not resuscitate: Secondary | ICD-10-CM | POA: Diagnosis present

## 2017-03-05 DIAGNOSIS — J449 Chronic obstructive pulmonary disease, unspecified: Secondary | ICD-10-CM | POA: Diagnosis present

## 2017-03-05 DIAGNOSIS — I2609 Other pulmonary embolism with acute cor pulmonale: Secondary | ICD-10-CM | POA: Diagnosis not present

## 2017-03-05 DIAGNOSIS — Z87440 Personal history of urinary (tract) infections: Secondary | ICD-10-CM | POA: Diagnosis not present

## 2017-03-05 DIAGNOSIS — I481 Persistent atrial fibrillation: Secondary | ICD-10-CM

## 2017-03-05 DIAGNOSIS — Z955 Presence of coronary angioplasty implant and graft: Secondary | ICD-10-CM

## 2017-03-05 DIAGNOSIS — I10 Essential (primary) hypertension: Secondary | ICD-10-CM | POA: Diagnosis not present

## 2017-03-05 DIAGNOSIS — J9601 Acute respiratory failure with hypoxia: Secondary | ICD-10-CM | POA: Diagnosis present

## 2017-03-05 DIAGNOSIS — E039 Hypothyroidism, unspecified: Secondary | ICD-10-CM | POA: Diagnosis present

## 2017-03-05 DIAGNOSIS — F0150 Vascular dementia without behavioral disturbance: Secondary | ICD-10-CM | POA: Diagnosis not present

## 2017-03-05 DIAGNOSIS — F0280 Dementia in other diseases classified elsewhere without behavioral disturbance: Secondary | ICD-10-CM | POA: Diagnosis present

## 2017-03-05 DIAGNOSIS — H9190 Unspecified hearing loss, unspecified ear: Secondary | ICD-10-CM | POA: Diagnosis present

## 2017-03-05 DIAGNOSIS — G301 Alzheimer's disease with late onset: Secondary | ICD-10-CM | POA: Diagnosis not present

## 2017-03-05 DIAGNOSIS — Z79890 Hormone replacement therapy (postmenopausal): Secondary | ICD-10-CM

## 2017-03-05 DIAGNOSIS — F0281 Dementia in other diseases classified elsewhere with behavioral disturbance: Secondary | ICD-10-CM | POA: Diagnosis not present

## 2017-03-05 DIAGNOSIS — W19XXXA Unspecified fall, initial encounter: Secondary | ICD-10-CM | POA: Diagnosis present

## 2017-03-05 DIAGNOSIS — Z7902 Long term (current) use of antithrombotics/antiplatelets: Secondary | ICD-10-CM | POA: Diagnosis not present

## 2017-03-05 DIAGNOSIS — F419 Anxiety disorder, unspecified: Secondary | ICD-10-CM | POA: Diagnosis present

## 2017-03-05 DIAGNOSIS — Z887 Allergy status to serum and vaccine status: Secondary | ICD-10-CM

## 2017-03-05 DIAGNOSIS — I82411 Acute embolism and thrombosis of right femoral vein: Secondary | ICD-10-CM | POA: Diagnosis present

## 2017-03-05 DIAGNOSIS — R55 Syncope and collapse: Secondary | ICD-10-CM | POA: Diagnosis present

## 2017-03-05 DIAGNOSIS — R778 Other specified abnormalities of plasma proteins: Secondary | ICD-10-CM

## 2017-03-05 DIAGNOSIS — K219 Gastro-esophageal reflux disease without esophagitis: Secondary | ICD-10-CM | POA: Diagnosis present

## 2017-03-05 LAB — COMPREHENSIVE METABOLIC PANEL
ALK PHOS: 72 U/L (ref 38–126)
ALT: 11 U/L — AB (ref 14–54)
AST: 17 U/L (ref 15–41)
Albumin: 3.4 g/dL — ABNORMAL LOW (ref 3.5–5.0)
Anion gap: 13 (ref 5–15)
BUN: 28 mg/dL — ABNORMAL HIGH (ref 6–20)
CALCIUM: 8.8 mg/dL — AB (ref 8.9–10.3)
CHLORIDE: 100 mmol/L — AB (ref 101–111)
CO2: 25 mmol/L (ref 22–32)
CREATININE: 0.97 mg/dL (ref 0.44–1.00)
GFR calc Af Amer: 58 mL/min — ABNORMAL LOW (ref >60)
GFR calc non Af Amer: 50 mL/min — ABNORMAL LOW (ref >60)
GLUCOSE: 189 mg/dL — AB (ref 65–99)
Potassium: 3.8 mmol/L (ref 3.5–5.1)
Sodium: 138 mmol/L (ref 135–145)
Total Bilirubin: 1 mg/dL (ref 0.3–1.2)
Total Protein: 6.7 g/dL (ref 6.5–8.1)

## 2017-03-05 LAB — I-STAT TROPONIN, ED
TROPONIN I, POC: 0.07 ng/mL (ref 0.00–0.08)
TROPONIN I, POC: 0.12 ng/mL — AB (ref 0.00–0.08)

## 2017-03-05 LAB — URINALYSIS, ROUTINE W REFLEX MICROSCOPIC
BACTERIA UA: NONE SEEN
Bilirubin Urine: NEGATIVE
Glucose, UA: NEGATIVE mg/dL
HGB URINE DIPSTICK: NEGATIVE
KETONES UR: NEGATIVE mg/dL
Leukocytes, UA: NEGATIVE
Nitrite: NEGATIVE
Specific Gravity, Urine: 1.025 (ref 1.005–1.030)
pH: 5 (ref 5.0–8.0)

## 2017-03-05 LAB — CBC WITH DIFFERENTIAL/PLATELET
Basophils Absolute: 0 10*3/uL (ref 0.0–0.1)
Basophils Relative: 0 %
EOS PCT: 0 %
Eosinophils Absolute: 0 10*3/uL (ref 0.0–0.7)
HCT: 35.2 % — ABNORMAL LOW (ref 36.0–46.0)
Hemoglobin: 11.4 g/dL — ABNORMAL LOW (ref 12.0–15.0)
LYMPHS ABS: 0.5 10*3/uL — AB (ref 0.7–4.0)
LYMPHS PCT: 5 %
MCH: 26.5 pg (ref 26.0–34.0)
MCHC: 32.4 g/dL (ref 30.0–36.0)
MCV: 81.7 fL (ref 78.0–100.0)
MONO ABS: 0.4 10*3/uL (ref 0.1–1.0)
Monocytes Relative: 5 %
Neutro Abs: 8.5 10*3/uL — ABNORMAL HIGH (ref 1.7–7.7)
Neutrophils Relative %: 90 %
PLATELETS: 161 10*3/uL (ref 150–400)
RBC: 4.31 MIL/uL (ref 3.87–5.11)
RDW: 14.6 % (ref 11.5–15.5)
WBC: 9.5 10*3/uL (ref 4.0–10.5)

## 2017-03-05 LAB — I-STAT CG4 LACTIC ACID, ED
LACTIC ACID, VENOUS: 1.78 mmol/L (ref 0.5–1.9)
Lactic Acid, Venous: 2.44 mmol/L (ref 0.5–1.9)

## 2017-03-05 MED ORDER — ALUM & MAG HYDROXIDE-SIMETH 200-200-20 MG/5ML PO SUSP: 30.0000 mL | Freq: Four times a day (QID) | ORAL | Status: DC | PRN

## 2017-03-05 MED ORDER — SODIUM CHLORIDE 0.9 % IV BOLUS (SEPSIS)
500.0000 mL | Freq: Once | INTRAVENOUS | Status: AC
  Administered 2017-03-05: 500 mL via INTRAVENOUS

## 2017-03-05 MED ORDER — GUAIFENESIN 100 MG/5ML PO SOLN: 200.0000 mg | Freq: Four times a day (QID) | ORAL | Status: DC | PRN

## 2017-03-05 MED ORDER — IOPAMIDOL (ISOVUE-370) INJECTION 76%
100.0000 mL | Freq: Once | INTRAVENOUS | Status: AC | PRN
  Administered 2017-03-05: 100 mL via INTRAVENOUS

## 2017-03-05 MED ORDER — DOCUSATE SODIUM 100 MG PO CAPS
100.0000 mg | ORAL_CAPSULE | Freq: Every day | ORAL | Status: DC
  Administered 2017-03-06 – 2017-03-09 (×4): 100 mg via ORAL
  Filled 2017-03-05 (×4): qty 1

## 2017-03-05 MED ORDER — DILTIAZEM HCL ER COATED BEADS 180 MG PO CP24
180.0000 mg | ORAL_CAPSULE | Freq: Every day | ORAL | Status: DC
  Administered 2017-03-06 – 2017-03-09 (×4): 180 mg via ORAL
  Filled 2017-03-05 (×5): qty 1

## 2017-03-05 MED ORDER — LOPERAMIDE HCL 2 MG PO CAPS: 2.0000 mg | ORAL_CAPSULE | ORAL | Status: DC | PRN

## 2017-03-05 MED ORDER — CITALOPRAM HYDROBROMIDE 20 MG PO TABS
10.0000 mg | ORAL_TABLET | Freq: Every day | ORAL | Status: DC
  Administered 2017-03-06 – 2017-03-09 (×4): 10 mg via ORAL
  Filled 2017-03-05 (×4): qty 1

## 2017-03-05 MED ORDER — VITAMIN D 1000 UNITS PO TABS
5000.0000 [IU] | ORAL_TABLET | Freq: Every day | ORAL | Status: DC
  Administered 2017-03-06 – 2017-03-09 (×4): 5000 [IU] via ORAL
  Filled 2017-03-05 (×4): qty 5

## 2017-03-05 MED ORDER — HEPARIN (PORCINE) IN NACL 100-0.45 UNIT/ML-% IJ SOLN
900.0000 [IU]/h | INTRAMUSCULAR | Status: DC
  Administered 2017-03-05: 900 [IU]/h via INTRAVENOUS
  Filled 2017-03-05 (×2): qty 250

## 2017-03-05 MED ORDER — LEVOTHYROXINE SODIUM 75 MCG PO TABS
37.5000 ug | ORAL_TABLET | Freq: Every day | ORAL | Status: DC
  Administered 2017-03-06 – 2017-03-09 (×4): 37.5 ug via ORAL
  Filled 2017-03-05 (×4): qty 1

## 2017-03-05 MED ORDER — MAGNESIUM HYDROXIDE 400 MG/5ML PO SUSP: 30.0000 mL | Freq: Every evening | ORAL | Status: DC | PRN

## 2017-03-05 MED ORDER — NITROGLYCERIN 0.4 MG SL SUBL: 0.4000 mg | SUBLINGUAL_TABLET | SUBLINGUAL | Status: DC | PRN

## 2017-03-05 MED ORDER — MEMANTINE HCL ER 28 MG PO CP24
28.0000 mg | ORAL_CAPSULE | Freq: Every day | ORAL | Status: DC
  Administered 2017-03-06 – 2017-03-09 (×4): 28 mg via ORAL
  Filled 2017-03-05 (×4): qty 1

## 2017-03-05 MED ORDER — ASPIRIN 300 MG RE SUPP
300.0000 mg | Freq: Once | RECTAL | Status: AC
  Administered 2017-03-05: 300 mg via RECTAL
  Filled 2017-03-05: qty 1

## 2017-03-05 MED ORDER — HEPARIN BOLUS VIA INFUSION
2000.0000 [IU] | Freq: Once | INTRAVENOUS | Status: AC
  Administered 2017-03-05: 2000 [IU] via INTRAVENOUS
  Filled 2017-03-05: qty 2000

## 2017-03-05 MED ORDER — IOPAMIDOL (ISOVUE-370) INJECTION 76%
INTRAVENOUS | Status: AC
Start: 2017-03-05 — End: 2017-03-06
  Filled 2017-03-05: qty 100

## 2017-03-05 MED ORDER — ACETAMINOPHEN 500 MG PO TABS
500.0000 mg | ORAL_TABLET | Freq: Four times a day (QID) | ORAL | Status: DC | PRN
  Administered 2017-03-08: 500 mg via ORAL
  Filled 2017-03-05: qty 1

## 2017-03-05 MED ORDER — MONTELUKAST SODIUM 10 MG PO TABS
10.0000 mg | ORAL_TABLET | Freq: Every day | ORAL | Status: DC
  Administered 2017-03-05 – 2017-03-09 (×5): 10 mg via ORAL
  Filled 2017-03-05 (×5): qty 1

## 2017-03-05 MED ORDER — METOPROLOL TARTRATE 5 MG/5ML IV SOLN
2.5000 mg | Freq: Once | INTRAVENOUS | Status: AC
  Administered 2017-03-05: 2.5 mg via INTRAVENOUS
  Filled 2017-03-05: qty 5

## 2017-03-05 NOTE — ED Notes (Addendum)
Patient approved to room 1418. RN can call report to [facility] [phone] at 17:20.  Barbee Shropshire. Brigitte Pulse, RN

## 2017-03-05 NOTE — ED Triage Notes (Signed)
Staff at [facility] saw pt. To have an episode of emesis at breakfast ?choked?, at which time pt. Was seen to "pass out" and fall. No identifiable injuries are detected with exam. Pt. Arrives here awake, alert and in no distress. She is confused at her baseline.

## 2017-03-05 NOTE — ED Triage Notes (Signed)
I have just called report to [facility] and will transport now.

## 2017-03-05 NOTE — ED Notes (Signed)
Bed: OI51 Expected date: 03/05/17 Expected time: 9:55 AM Means of arrival: Ambulance Comments: Emesis from Nursing home

## 2017-03-05 NOTE — Progress Notes (Signed)
Recd pt from ED. Condition stable. Spoke with Pharmacy PTA to unit. They were tubing hep to the ED for administration. Spoke with transport RN and he was not aware. He was to send hep back up. Call pharmacy at [facility]. They are going to re dispense the hep gtt. Cont with plan of care

## 2017-03-05 NOTE — ED Notes (Signed)
Pt. Continues to be in no distress and is quite cooperative.

## 2017-03-05 NOTE — H&P (Signed)
History and Physical  [name] JKK:[id number] DOB: 04/21/1925 DOA: 03/05/2017  Referring physician: EDP PCP: Sande Brothers, MD   Chief Complaint: syncope/PE  HPI: [name] is a 83 y.o. female   severe Alzheimer's dementia with no short term memory, very poor hearing, A-fib, not on anticoagulation possibly due to fall,  CAD with stent in RCA 2010, CKD 3, hypothyroid, she is sent from  [place] [facility] due to passing out and vomiting.  Patient is confused not able to provide history.  HPI obtained by talking to daughter, chart review and talking to EDP.  ED course: On arrival she is hypoxia, 02 88% on room air, afib/ tachycardia, heart rate 122.  Blood pressure 102/50.  CBC with mild anemia hemoglobin 11.4, creatinine 0.97, lactic acid 2.4, troponin 0.12, Extensive bilateral pulmonary embolism. CT evidence of right heart strain (RV/LV Ratio = 1.4) consistent with at least submassive (intermediate risk) PE.  EDP discussed case with critical care doctor Veleta Miners who recommended medicine admission with heparin drip.  Per Dr. Pearline Cables patient is not a candidate for aggressive intervention due to advanced dementia and age.  Heparin drip ordered by EDP hospitalist called to admit the patient.  Daughter reports patient recently has some bleeding issues.  She was evaluated by GYN physician who did not think patient has vaginal bleed.  Daughter thinks the patient has blood in urine.  UA obtained in the ED no blood.  Daughter reported no blood in the stool.  daughter reports  patient has been ambulatory, no recent falls.   Review of Systems:  Detail per HPI, Review of systems are otherwise negative  Past Medical History:  Diagnosis Date  . Alzheimer disease prolapsed bladder  . Anxiety   . Atrial fibrillation ([place])   . CAD (coronary artery disease), native coronary artery    H/O PTCA, balloon angioplasty Mid LAD in 2001, 4.0x18 vision in proximal RCA 3.2010 and cardiac cath 01/2009  revealed patent PCI sites  . Chronic kidney disease, stage III (moderate) (HCC)   . COPD (chronic obstructive pulmonary disease) ([place])   . Dyslipidemia   . GERD (gastroesophageal reflux disease)   . H/O: GI bleed    Massive rectal bleed 2008. Not a coumadin candidate. A. Fib without recurrence.    Marland Kitchen Hx: UTI (urinary tract infection)   . Hypertension   . Hypothyroid    Past Surgical History:  Procedure Laterality Date  . ABDOMINAL HYSTERECTOMY    . CORONARY ANGIOPLASTY WITH STENT PLACEMENT  bladder tacking  . myocardial infarct    . TOTAL VAGINAL HYSTERECTOMY     Social History:  reports that she quit smoking about 58 years ago. she has never used smokeless tobacco. She reports that she does not drink alcohol or use drugs. Patient lives at every 4 house & ambulatory  Allergies  Allergen Reactions  . Codeine Nausea And Vomiting    Pt doesn't remember this reaction   . Tuberculin Tests     Unknown-MAR    No family history on file.    Prior to Admission medications   Medication Sig Start Date End Date Taking? Authorizing Provider  acetaminophen (TYLENOL) 500 MG tablet Take 1,000 mg by mouth every 6 (six) hours as needed.    [provider]  alum & mag hydroxide-simeth (MAALOX/MYLANTA) 200-200-20 MG/5ML suspension Take 30 mLs by mouth every 6 (six) hours as needed for indigestion or heartburn.    [provider]  Cholecalciferol (VITAMIN D-3) 5000 UNITS TABS  Take 1 tablet by mouth daily.    [provider]  citalopram (CELEXA) 10 MG tablet Take 10 mg by mouth daily.      [provider]  clopidogrel (PLAVIX) 75 MG tablet Take 75 mg by mouth daily.    [provider]  diltiazem (DILACOR XR) 180 MG 24 hr capsule Take 1 capsule (180 mg total) by mouth daily. 01/11/15   Burns, Arloa Koh, MD  docusate sodium (COLACE) 100 MG capsule Take 100 mg by mouth daily.    [provider]  guaifenesin (ROBITUSSIN) 100 MG/5ML syrup Take 200 mg by  mouth every 6 (six) hours as needed for cough.     [provider]  hydrocortisone cream 1 % Apply 1 application topically 2 (two) times daily as needed for itching.    [provider]  levothyroxine (SYNTHROID, LEVOTHROID) 75 MCG tablet Take 37.5 mcg by mouth daily before breakfast.    [provider]  loperamide (IMODIUM) 2 MG capsule Take 2 mg by mouth as needed for diarrhea or loose stools.    [provider]  magnesium hydroxide (MILK OF MAGNESIA) 400 MG/5ML suspension Take 30 mLs by mouth at bedtime as needed for mild constipation.     [provider]  Memantine HCl ER (NAMENDA XR) 28 MG CP24 Take 28 mg by mouth daily.    [provider]  montelukast (SINGULAIR) 10 MG tablet Take 10 mg by mouth at bedtime. 05/15/16   [provider]  neomycin-bacitracin-polymyxin (NEOSPORIN) ointment Apply 1 application topically every 12 (twelve) hours. As needed for skin tears    [provider]  nitroGLYCERIN (NITROSTAT) 0.4 MG SL tablet Place 0.4 mg under the tongue every 5 (five) minutes as needed for chest pain.    [provider]    Physical Exam: BP (!) 126/101   Pulse 87   Temp 99.3 F (37.4 C) (Rectal)   Resp 17   SpO2 98%   General: Pleasantly demented lady, hard of hearing ,NAD Eyes: PERRL ENT: unremarkable Neck: supple, no JVD Cardiovascular: afib, tachcyardia Respiratory: CTABL Abdomen: soft/ND/ND, positive bowel sounds Skin: no rash Musculoskeletal:  No edema Psychiatric: calm/cooperative Neurologic: no focal findings            Labs on Admission:  Basic Metabolic Panel: Recent Labs  Lab 03/05/17 1143  NA 138  K 3.8  CL 100*  CO2 25  GLUCOSE 189*  BUN 28*  CREATININE 0.97  CALCIUM 8.8*   Liver Function Tests: Recent Labs  Lab 03/05/17 1143  AST 17  ALT 11*  ALKPHOS 72  BILITOT 1.0  PROT 6.7  ALBUMIN 3.4*   No results for input(s): LIPASE, AMYLASE in the last 168 hours. No  results for input(s): AMMONIA in the last 168 hours. CBC: Recent Labs  Lab 03/05/17 1143  WBC 9.5  NEUTROABS 8.5*  HGB 11.4*  HCT 35.2*  MCV 81.7  PLT 161   Cardiac Enzymes: No results for input(s): CKTOTAL, CKMB, CKMBINDEX, TROPONINI in the last 168 hours.  BNP (last 3 results) No results for input(s): BNP in the last 8760 hours.  ProBNP (last 3 results) No results for input(s): PROBNP in the last 8760 hours.  CBG: No results for input(s): GLUCAP in the last 168 hours.  Radiological Exams on Admission: Dg Chest 2 View  Result Date: 03/05/2017 CLINICAL DATA:  Per order- syncope Staff at [employer] saw pt. To have an episode of emesis at breakfast ?choked?, at which time pt.  Was seen to "pass out" and fall. PT HX: ex smoker 1960, COPD, HTN Best images obtained due to mental statu.*comment was truncated* EXAM: CHEST  2 VIEW COMPARISON:  Radiograph 05/30/2016 FINDINGS: Normal cardiac silhouette. Low lung volumes. Density projecting over the LEFT lower lobe is unchanged from prior likely represents a benign rib lesion. IMPRESSION: Low lung volumes.  No acute cardiopulmonary findings Electronically Signed   By: Suzy Bouchard M.D.   On: 03/05/2017 10:53   Ct Angio Chest Pe W And/or Wo Contrast  Result Date: 03/05/2017 CLINICAL DATA:  Shortness of breath. Syncopal episode today. Clinical suspicion for pulmonary embolism. EXAM: CT ANGIOGRAPHY CHEST WITH CONTRAST TECHNIQUE: Multidetector CT imaging of the chest was performed using the standard protocol during bolus administration of intravenous contrast. Multiplanar CT image reconstructions and MIPs were obtained to evaluate the vascular anatomy. CONTRAST:  168mL ISOVUE-370 IOPAMIDOL (ISOVUE-370) INJECTION 76% COMPARISON:  02/01/2007 FINDINGS: Cardiovascular: Extensive pulmonary embolism is seen in both pulmonary arteries and all lobar branches. RV/LV ratio is 1.4, consistent with right heart strain. No evidence of saddle embolus. Mild  cardiomegaly, without pericardial effusion. Aortic and coronary artery atherosclerosis. Mediastinum/Nodes: No evidence of lymphadenopathy or other soft tissue masses. Lungs/Pleura: Mild atelectasis seen in both lower lobes and right upper lobe. No evidence of pulmonary consolidation or pleural effusion. No masses identified. Upper abdomen: No acute findings. Musculoskeletal: No suspicious bone lesions identified. Review of the MIP images confirms the above findings. IMPRESSION: Extensive bilateral pulmonary embolism. CT evidence of right heart strain (RV/LV Ratio = 1.4) consistent with at least submassive (intermediate risk) PE. The presence of right heart strain has been associated with an increased risk of morbidity and mortality. Please activate Code PE by paging [phone]. Critical Value/emergent results were called by telephone at the time of interpretation on 03/05/2017 at 4:16 pm to Dr. Gareth Morgan , who verbally acknowledged these results. Electronically Signed   By: Earle Gell M.D.   On: 03/05/2017 16:18      Assessment/Plan Present on Admission: . Pulmonary emboli (HCC)  Syncope likely due to acute submassive PE with right heart strain, acute hypoxic respiratory failure -Continue heparin drip, monitor signs of bleeding monitor H&H -Echocardiogram and bilateral lower extremity Doppler ordered -Patient is symptomatic from submassive PE, plan to continue heparin drip for 24-48 hours, transition to Eliquis.  I have discussed with daughter at bedside about risk and benefit of anticoagulation.  Daughter reports patient has advanced dementia but has been ambulatory, no recent falls. Patient does has intermittent blood in the urine recently.  Does not have significant anemia.  No blood in the urine here.  Daughter understand if patient developed life-threatening bleed, she will be taken off anticoagulation.  At that point she will consider palliative care.  A. fib RVR Blood Pressure stable,    Continue home meds Cardizem  History of CAD status post stent many years ago -Dr. Einar Gip is her cardiologist -Currently mild troponin elevation likely from PE -Started on heparin drip, stop Plavix to minimize risk of bleeding  Hypothyroidism continue Synthroid  Advanced dementia, only oriented to self at baseline.  Baseline ambulatory and pleasant. continue Namenda   DVT prophylaxis: on heparin drip  Consultants:  EDP discussed case with critical care Dr Veleta Miners  Code Status: DNR  Family Communication:  Patient and daughter at bedside  Disposition Plan: admit to med tele  Time spent: 34mins  Florencia Reasons MD, PhD [facility] Pager [phone] If 7PM-7AM, please contact night-coverage at [email or web address], password [facility]

## 2017-03-05 NOTE — ED Notes (Signed)
Assigned 1418 @ 16:59 call report @ 17:19

## 2017-03-05 NOTE — ED Notes (Signed)
ED TO INPATIENT HANDOFF REPORT  Name/Age/Gender [name] 83 y.o. female  Code Status Code Status History    Date Active Date Inactive Code Status Order ID Comments User Context   05/30/2016 17:25 05/31/2016 18:25 DNR [id number]  Debbe Odea, MD ED   05/30/2016 17:23 05/30/2016 17:25 DNR [id number]  Debbe Odea, MD ED   01/09/2015 15:37 01/11/2015 17:45 Full Code [id number]  Vickii Chafe, MD Inpatient   03/18/2011 22:21 03/21/2011 19:35 Full Code [id number]  Elease Hashimoto, RN ED    Questions for Most Recent Historical Code Status (Order [id number])    Question Answer Comment   In the event of cardiac or respiratory ARREST Do not call a "code blue"    In the event of cardiac or respiratory ARREST Do not perform Intubation, CPR, defibrillation or ACLS    In the event of cardiac or respiratory ARREST Use medication by any route, position, wound care, and other measures to relive pain and suffering. May use oxygen, suction and manual treatment of airway obstruction as needed for comfort.       Home/SNF/Other Nursing Home  Chief Complaint syncope  Level of Care/Admitting Diagnosis ED Disposition    ED Disposition Condition Comment   Admit  Hospital Area: [facility] [[id number]]  Level of Care: Telemetry [5]  Admit to tele based on following criteria: Complex arrhythmia (Bradycardia/Tachycardia)  Diagnosis: Pulmonary emboli [facility]) [[id number]]  Admitting Physician: Florencia Reasons [[id number]]  Attending Physician: Florencia Reasons [[id number]]  Estimated length of stay: past midnight tomorrow  Certification:: I certify this patient will need inpatient services for at least 2 midnights  PT Class (Do Not Modify): Inpatient [101]  PT Acc Code (Do Not Modify): Private [1]       Medical History Past Medical History:  Diagnosis Date  . Alzheimer disease prolapsed bladder  . Anxiety   . Atrial fibrillation ([place])   . CAD (coronary artery disease), native coronary artery    H/O PTCA, balloon angioplasty Mid LAD in 2001, 4.0x18 vision in proximal RCA 3.2010 and cardiac cath 01/2009 revealed patent PCI sites  . Chronic kidney disease, stage III (moderate) (HCC)   . COPD (chronic obstructive pulmonary disease) ([place])   . Dyslipidemia   . GERD (gastroesophageal reflux disease)   . H/O: GI bleed    Massive rectal bleed 2008. Not a coumadin candidate. A. Fib without recurrence.    Marland Kitchen Hx: UTI (urinary tract infection)   . Hypertension   . Hypothyroid     Allergies Allergies  Allergen Reactions  . Codeine Nausea And Vomiting    Pt doesn't remember this reaction   . Tuberculin Tests     Unknown-MAR    IV Location/Drains/Wounds Patient Lines/Drains/Airways Status   Active Line/Drains/Airways    Name:   Placement date:   Placement time:   Site:   Days:   Peripheral IV 03/05/17 Left Antecubital   03/05/17    1010    Antecubital   less than 1   Peripheral IV 03/05/17 Right;Lateral Forearm   03/05/17    1140    Forearm   less than 1   Peripheral IV 03/05/17 Left;Lateral Forearm   03/05/17    1456    Forearm   less than 1          Labs/Imaging Results for orders placed or performed during the hospital encounter of 03/05/17 (from the past 48 hour(s))  I-Stat Troponin, ED (not at [facility])     Status: None  Collection Time: 03/05/17 10:25 AM  Result Value Ref Range   Troponin i, poc 0.07 0.00 - 0.08 ng/mL   Comment 3            Comment: Due to the release kinetics of cTnI, a negative result within the first hours of the onset of symptoms does not rule out myocardial infarction with certainty. If myocardial infarction is still suspected, repeat the test at appropriate intervals.   CBC with Differential     Status: Abnormal   Collection Time: 03/05/17 11:43 AM  Result Value Ref Range   WBC 9.5 4.0 - 10.5 K/uL   RBC 4.31 3.87 - 5.11 MIL/uL   Hemoglobin 11.4 (L) 12.0 - 15.0 g/dL   HCT 35.2 (L) 36.0 - 46.0 %   MCV 81.7 78.0 - 100.0 fL   MCH 26.5 26.0 - 34.0  pg   MCHC 32.4 30.0 - 36.0 g/dL   RDW 14.6 11.5 - 15.5 %   Platelets 161 150 - 400 K/uL   Neutrophils Relative % 90 %   Neutro Abs 8.5 (H) 1.7 - 7.7 K/uL   Lymphocytes Relative 5 %   Lymphs Abs 0.5 (L) 0.7 - 4.0 K/uL   Monocytes Relative 5 %   Monocytes Absolute 0.4 0.1 - 1.0 K/uL   Eosinophils Relative 0 %   Eosinophils Absolute 0.0 0.0 - 0.7 K/uL   Basophils Relative 0 %   Basophils Absolute 0.0 0.0 - 0.1 K/uL  Comprehensive metabolic panel     Status: Abnormal   Collection Time: 03/05/17 11:43 AM  Result Value Ref Range   Sodium 138 135 - 145 mmol/L   Potassium 3.8 3.5 - 5.1 mmol/L   Chloride 100 (L) 101 - 111 mmol/L   CO2 25 22 - 32 mmol/L   Glucose, Bld 189 (H) 65 - 99 mg/dL   BUN 28 (H) 6 - 20 mg/dL   Creatinine, Ser 0.97 0.44 - 1.00 mg/dL   Calcium 8.8 (L) 8.9 - 10.3 mg/dL   Total Protein 6.7 6.5 - 8.1 g/dL   Albumin 3.4 (L) 3.5 - 5.0 g/dL   AST 17 15 - 41 U/L   ALT 11 (L) 14 - 54 U/L   Alkaline Phosphatase 72 38 - 126 U/L   Total Bilirubin 1.0 0.3 - 1.2 mg/dL   GFR calc non Af Amer 50 (L) >60 mL/min   GFR calc Af Amer 58 (L) >60 mL/min    Comment: (NOTE) The eGFR has been calculated using the CKD EPI equation. This calculation has not been validated in all clinical situations. eGFR's persistently <60 mL/min signify possible Chronic Kidney Disease.    Anion gap 13 5 - 15  I-Stat CG4 Lactic Acid, ED     Status: Abnormal   Collection Time: 03/05/17 11:48 AM  Result Value Ref Range   Lactic Acid, Venous 2.44 (HH) 0.5 - 1.9 mmol/L   Comment NOTIFIED PHYSICIAN   Urinalysis, Routine w reflex microscopic     Status: Abnormal   Collection Time: 03/05/17 12:10 PM  Result Value Ref Range   Color, Urine YELLOW YELLOW   APPearance CLEAR CLEAR   Specific Gravity, Urine 1.025 1.005 - 1.030   pH 5.0 5.0 - 8.0   Glucose, UA NEGATIVE NEGATIVE mg/dL   Hgb urine dipstick NEGATIVE NEGATIVE   Bilirubin Urine NEGATIVE NEGATIVE   Ketones, ur NEGATIVE NEGATIVE mg/dL   Protein,  ur >=300 (A) NEGATIVE mg/dL   Nitrite NEGATIVE NEGATIVE   Leukocytes, UA NEGATIVE NEGATIVE  RBC / HPF 0-5 0 - 5 RBC/hpf   WBC, UA 0-5 0 - 5 WBC/hpf   Bacteria, UA NONE SEEN NONE SEEN   Squamous Epithelial / LPF 0-5 (A) NONE SEEN   Mucus PRESENT    Hyaline Casts, UA PRESENT   I-Stat CG4 Lactic Acid, ED     Status: None   Collection Time: 03/05/17 12:55 PM  Result Value Ref Range   Lactic Acid, Venous 1.78 0.5 - 1.9 mmol/L  I-Stat Troponin, ED (not at [facility])     Status: Abnormal   Collection Time: 03/05/17  2:54 PM  Result Value Ref Range   Troponin i, poc 0.12 (HH) 0.00 - 0.08 ng/mL   Comment NOTIFIED PHYSICIAN    Comment 3            Comment: Due to the release kinetics of cTnI, a negative result within the first hours of the onset of symptoms does not rule out myocardial infarction with certainty. If myocardial infarction is still suspected, repeat the test at appropriate intervals.    Dg Chest 2 View  Result Date: 03/05/2017 CLINICAL DATA:  Per order- syncope Staff at [employer] saw pt. To have an episode of emesis at breakfast ?choked?, at which time pt. Was seen to "pass out" and fall. PT HX: ex smoker 1960, COPD, HTN Best images obtained due to mental statu.*comment was truncated* EXAM: CHEST  2 VIEW COMPARISON:  Radiograph 05/30/2016 FINDINGS: Normal cardiac silhouette. Low lung volumes. Density projecting over the LEFT lower lobe is unchanged from prior likely represents a benign rib lesion. IMPRESSION: Low lung volumes.  No acute cardiopulmonary findings Electronically Signed   By: Suzy Bouchard M.D.   On: 03/05/2017 10:53   Ct Angio Chest Pe W And/or Wo Contrast  Result Date: 03/05/2017 CLINICAL DATA:  Shortness of breath. Syncopal episode today. Clinical suspicion for pulmonary embolism. EXAM: CT ANGIOGRAPHY CHEST WITH CONTRAST TECHNIQUE: Multidetector CT imaging of the chest was performed using the standard protocol during bolus administration of intravenous  contrast. Multiplanar CT image reconstructions and MIPs were obtained to evaluate the vascular anatomy. CONTRAST:  163m ISOVUE-370 IOPAMIDOL (ISOVUE-370) INJECTION 76% COMPARISON:  02/01/2007 FINDINGS: Cardiovascular: Extensive pulmonary embolism is seen in both pulmonary arteries and all lobar branches. RV/LV ratio is 1.4, consistent with right heart strain. No evidence of saddle embolus. Mild cardiomegaly, without pericardial effusion. Aortic and coronary artery atherosclerosis. Mediastinum/Nodes: No evidence of lymphadenopathy or other soft tissue masses. Lungs/Pleura: Mild atelectasis seen in both lower lobes and right upper lobe. No evidence of pulmonary consolidation or pleural effusion. No masses identified. Upper abdomen: No acute findings. Musculoskeletal: No suspicious bone lesions identified. Review of the MIP images confirms the above findings. IMPRESSION: Extensive bilateral pulmonary embolism. CT evidence of right heart strain (RV/LV Ratio = 1.4) consistent with at least submassive (intermediate risk) PE. The presence of right heart strain has been associated with an increased risk of morbidity and mortality. Please activate Code PE by paging [phone] Critical Value/emergent results were called by telephone at the time of interpretation on 03/05/2017 at 4:16 pm to Dr. EGareth Morgan, who verbally acknowledged these results. Electronically Signed   By: JEarle GellM.D.   On: 03/05/2017 16:18    Pending Labs Unresulted Labs (From admission, onward)   Start     Ordered   03/05/17 1032  Urine culture  STAT,   STAT     03/05/17 1032      Vitals/Pain Today's Vitals   03/05/17 1500  03/05/17 1512 03/05/17 1545 03/05/17 1630  BP: (!) 122/95 (!) 122/95  (!) 126/101  Pulse: 73 88 (!) 120 87  Resp: 16 16 18 17   Temp:      TempSrc:      SpO2: 100% 97% 92% 98%    Isolation Precautions No active isolations  Medications Medications  iopamidol (ISOVUE-370) 76 % injection (not  administered)  sodium chloride 0.9 % bolus 500 mL (0 mLs Intravenous Stopped 03/05/17 1247)  metoprolol tartrate (LOPRESSOR) injection 2.5 mg (2.5 mg Intravenous Given 03/05/17 1435)  aspirin suppository 300 mg (300 mg Rectal Given 03/05/17 1614)  iopamidol (ISOVUE-370) 76 % injection 100 mL (100 mLs Intravenous Contrast Given 03/05/17 1532)    Mobility walks

## 2017-03-05 NOTE — ED Provider Notes (Signed)
[facility] Provider Note   CSN: [id number] Arrival date & time: 03/05/17  1002     History   Chief Complaint Chief Complaint  Patient presents with  . Loss of Consciousness    HPI [name] is a 83 y.o. female.  HPI   83 year old female with a history of Alzheimer's dementia, coronary artery disease, atrial fibrillation, dyslipidemia, hypertension, COPD, presents with concern for syncope.  History is limited by patient's dementia, and received from EMS.  Facility had reported she had an episode of emesis at breakfast then was unresponsive for brief period of time. No reported seizure like activity.  Level V Caveat, dementia. Patient unable to provide any history.  Daughter reports she is ambulatory at baseline.  Past Medical History:  Diagnosis Date  . Alzheimer disease prolapsed bladder  . Anxiety   . Atrial fibrillation ([place])   . CAD (coronary artery disease), native coronary artery    H/O PTCA, balloon angioplasty Mid LAD in 2001, 4.0x18 vision in proximal RCA 3.2010 and cardiac cath 01/2009 revealed patent PCI sites  . Chronic kidney disease, stage III (moderate) (HCC)   . COPD (chronic obstructive pulmonary disease) ([place])   . Dyslipidemia   . GERD (gastroesophageal reflux disease)   . H/O: GI bleed    Massive rectal bleed 2008. Not a coumadin candidate. A. Fib without recurrence.    Marland Kitchen Hx: UTI (urinary tract infection)   . Hypertension   . Hypothyroid     Patient Active Problem List   Diagnosis Date Noted  . Pulmonary emboli ([place]) 03/05/2017  . Hyperkalemia 05/30/2016  . AKI (acute kidney injury) ([place]) 01/10/2015  . Atrial fibrillation ([place]) 01/09/2015  . Pain in the chest   . Fall 03/21/2011  . Hypokalemia 03/18/2011  . CAD (coronary artery disease) 03/18/2011  . Stented coronary artery 03/18/2011  . Atrial fibrillation with RVR ([place])   . Alzheimer disease   . Anxiety   . Hypertension   . COPD (chronic  obstructive pulmonary disease) ([place])   . Chronic kidney disease, stage III (moderate) (HCC)   . GERD (gastroesophageal reflux disease)   . Dyslipidemia   . Hypothyroid     Past Surgical History:  Procedure Laterality Date  . ABDOMINAL HYSTERECTOMY    . CORONARY ANGIOPLASTY WITH STENT PLACEMENT  bladder tacking  . myocardial infarct    . TOTAL VAGINAL HYSTERECTOMY      OB History    Gravida Para Term Preterm AB Living   4 4 4     4    SAB TAB Ectopic Multiple Live Births                   Home Medications    Prior to Admission medications   Medication Sig Start Date End Date Taking? Authorizing Provider  acetaminophen (TYLENOL) 500 MG tablet Take 1,000 mg by mouth every 6 (six) hours as needed.    [provider]  alum & mag hydroxide-simeth (MAALOX/MYLANTA) 200-200-20 MG/5ML suspension Take 30 mLs by mouth every 6 (six) hours as needed for indigestion or heartburn.    [provider]  Cholecalciferol (VITAMIN D-3) 5000 UNITS TABS Take 1 tablet by mouth daily.    [provider]  citalopram (CELEXA) 10 MG tablet Take 10 mg by mouth daily.      [provider]  clopidogrel (PLAVIX) 75 MG tablet Take 75 mg by mouth daily.    [provider]  diltiazem (DILACOR XR) 180  MG 24 hr capsule Take 1 capsule (180 mg total) by mouth daily. 01/11/15   Burns, Arloa Koh, MD  docusate sodium (COLACE) 100 MG capsule Take 100 mg by mouth daily.    [provider]  guaifenesin (ROBITUSSIN) 100 MG/5ML syrup Take 200 mg by mouth every 6 (six) hours as needed for cough.     [provider]  hydrocortisone cream 1 % Apply 1 application topically 2 (two) times daily as needed for itching.    [provider]  levothyroxine (SYNTHROID, LEVOTHROID) 75 MCG tablet Take 37.5 mcg by mouth daily before breakfast.    [provider]  loperamide (IMODIUM) 2 MG capsule Take 2 mg by mouth as needed for diarrhea or loose stools.     [provider]  magnesium hydroxide (MILK OF MAGNESIA) 400 MG/5ML suspension Take 30 mLs by mouth at bedtime as needed for mild constipation.     [provider]  Memantine HCl ER (NAMENDA XR) 28 MG CP24 Take 28 mg by mouth daily.    [provider]  montelukast (SINGULAIR) 10 MG tablet Take 10 mg by mouth at bedtime. 05/15/16   [provider]  neomycin-bacitracin-polymyxin (NEOSPORIN) ointment Apply 1 application topically every 12 (twelve) hours. As needed for skin tears    [provider]  nitroGLYCERIN (NITROSTAT) 0.4 MG SL tablet Place 0.4 mg under the tongue every 5 (five) minutes as needed for chest pain.    [provider]    Family History No family history on file.  Social History Social History   Tobacco Use  . Smoking status: Former Smoker    Last attempt to quit: 03/18/1959    Years since quitting: 58.0  . Smokeless tobacco: Never Used  Substance Use Topics  . Alcohol use: No    Alcohol/week: 0.6 oz    Types: 1 Glasses of wine per week  . Drug use: No     Allergies   Codeine and Tuberculin tests   Review of Systems Review of Systems     Physical Exam Updated Vital Signs BP (!) 135/91 (BP Location: Left Arm)   Pulse 65   Temp 99.1 F (37.3 C) (Oral)   Resp 20   Ht 5\' 4"  (1.626 m)   Wt 64.6 kg (142 lb 6.7 oz)   SpO2 91%   BMI 24.45 kg/m   Physical Exam  Constitutional: She appears well-developed and well-nourished. No distress.  HENT:  Head: Normocephalic and atraumatic.  Eyes: Conjunctivae and EOM are normal.  Neck: Normal range of motion.  Cardiovascular: Normal heart sounds and intact distal pulses. An irregularly irregular rhythm present. Tachycardia present. Exam reveals no gallop and no friction rub.  No murmur heard. Pulmonary/Chest: Effort normal and breath sounds normal. No respiratory distress. She has no wheezes. She has no rales.  Abdominal: Soft. She exhibits no distension. There  is no tenderness. There is no guarding.  Musculoskeletal: She exhibits no edema or tenderness.  Neurological: She is alert. GCS eye subscore is 4. GCS verbal subscore is 4. GCS motor subscore is 6.  Able to follow some commands, symmetric facies, normal movement of arms and legs, no focal deficits, does not answer question regarding sensation however sensation appears intact, responding to sensory stimuli   Skin: Skin is warm and dry. No rash noted. She is not diaphoretic. No erythema.  Nursing note and vitals reviewed.    ED Treatments / Results  Labs (all labs ordered are listed, but only abnormal results  are displayed) Labs Reviewed  CBC WITH DIFFERENTIAL/PLATELET - Abnormal; Notable for the following components:      Result Value   Hemoglobin 11.4 (*)    HCT 35.2 (*)    Neutro Abs 8.5 (*)    Lymphs Abs 0.5 (*)    All other components within normal limits  COMPREHENSIVE METABOLIC PANEL - Abnormal; Notable for the following components:   Chloride 100 (*)    Glucose, Bld 189 (*)    BUN 28 (*)    Calcium 8.8 (*)    Albumin 3.4 (*)    ALT 11 (*)    GFR calc non Af Amer 50 (*)    GFR calc Af Amer 58 (*)    All other components within normal limits  URINALYSIS, ROUTINE W REFLEX MICROSCOPIC - Abnormal; Notable for the following components:   Protein, ur >=300 (*)    Squamous Epithelial / LPF 0-5 (*)    All other components within normal limits  I-STAT CG4 LACTIC ACID, ED - Abnormal; Notable for the following components:   Lactic Acid, Venous 2.44 (*)    All other components within normal limits  I-STAT TROPONIN, ED - Abnormal; Notable for the following components:   Troponin i, poc 0.12 (*)    All other components within normal limits  URINE CULTURE  MRSA PCR SCREENING  CBC  COMPREHENSIVE METABOLIC PANEL  MAGNESIUM  I-STAT TROPONIN, ED  I-STAT CG4 LACTIC ACID, ED    EKG  EKG Interpretation None       Radiology Dg Chest 2 View  Result Date: 03/05/2017 CLINICAL  DATA:  Per order- syncope Staff at [facility] saw pt. To have an episode of emesis at breakfast ?choked?, at which time pt. Was seen to "pass out" and fall. PT HX: ex smoker 1960, COPD, HTN Best images obtained due to mental statu.*comment was truncated* EXAM: CHEST  2 VIEW COMPARISON:  Radiograph 05/30/2016 FINDINGS: Normal cardiac silhouette. Low lung volumes. Density projecting over the LEFT lower lobe is unchanged from prior likely represents a benign rib lesion. IMPRESSION: Low lung volumes.  No acute cardiopulmonary findings Electronically Signed   By: Suzy Bouchard M.D.   On: 03/05/2017 10:53   Ct Angio Chest Pe W And/or Wo Contrast  Result Date: 03/05/2017 CLINICAL DATA:  Shortness of breath. Syncopal episode today. Clinical suspicion for pulmonary embolism. EXAM: CT ANGIOGRAPHY CHEST WITH CONTRAST TECHNIQUE: Multidetector CT imaging of the chest was performed using the standard protocol during bolus administration of intravenous contrast. Multiplanar CT image reconstructions and MIPs were obtained to evaluate the vascular anatomy. CONTRAST:  149mL ISOVUE-370 IOPAMIDOL (ISOVUE-370) INJECTION 76% COMPARISON:  02/01/2007 FINDINGS: Cardiovascular: Extensive pulmonary embolism is seen in both pulmonary arteries and all lobar branches. RV/LV ratio is 1.4, consistent with right heart strain. No evidence of saddle embolus. Mild cardiomegaly, without pericardial effusion. Aortic and coronary artery atherosclerosis. Mediastinum/Nodes: No evidence of lymphadenopathy or other soft tissue masses. Lungs/Pleura: Mild atelectasis seen in both lower lobes and right upper lobe. No evidence of pulmonary consolidation or pleural effusion. No masses identified. Upper abdomen: No acute findings. Musculoskeletal: No suspicious bone lesions identified. Review of the MIP images confirms the above findings. IMPRESSION: Extensive bilateral pulmonary embolism. CT evidence of right heart strain (RV/LV Ratio = 1.4) consistent  with at least submassive (intermediate risk) PE. The presence of right heart strain has been associated with an increased risk of morbidity and mortality. Please activate Code PE by paging [phone]. Critical Value/emergent results were called by  telephone at the time of interpretation on 03/05/2017 at 4:16 pm to Dr. Gareth Morgan , who verbally acknowledged these results. Electronically Signed   By: Earle Gell M.D.   On: 03/05/2017 16:18    Procedures .Critical Care Performed by: Gareth Morgan, MD Authorized by: Gareth Morgan, MD   Critical care provider statement:    Critical care time (minutes):  40   Critical care was necessary to treat or prevent imminent or life-threatening deterioration of the following conditions:  Cardiac failure, circulatory failure and respiratory failure   Critical care was time spent personally by me on the following activities:  Examination of patient, evaluation of patient's response to treatment, re-evaluation of patient's condition, pulse oximetry, ordering and review of radiographic studies, discussions with consultants, development of treatment plan with patient or surrogate and ordering and performing treatments and interventions Comments:     Pulmonary embolism   (including critical care time)  Medications Ordered in ED Medications  iopamidol (ISOVUE-370) 76 % injection (not administered)  citalopram (CELEXA) tablet 10 mg (not administered)  nitroGLYCERIN (NITROSTAT) SL tablet 0.4 mg (not administered)  memantine (NAMENDA XR) 24 hr capsule 28 mg (not administered)  loperamide (IMODIUM) capsule 2 mg (not administered)  alum & mag hydroxide-simeth (MAALOX/MYLANTA) 200-200-20 MG/5ML suspension 30 mL (not administered)  magnesium hydroxide (MILK OF MAGNESIA) suspension 30 mL (not administered)  guaifenesin (ROBITUSSIN) 100 MG/5ML syrup 200 mg (not administered)  Vitamin D-3 TABS 1 tablet (not administered)  levothyroxine (SYNTHROID, LEVOTHROID)  tablet 37.5 mcg (not administered)  docusate sodium (COLACE) capsule 100 mg (not administered)  diltiazem (DILACOR XR) 24 hr capsule 180 mg (not administered)  acetaminophen (TYLENOL) tablet 500 mg (not administered)  montelukast (SINGULAIR) tablet 10 mg (not administered)  heparin ADULT infusion 100 units/mL (25000 units/29mL sodium chloride 0.45%) (not administered)  heparin bolus via infusion 2,000 Units (not administered)  sodium chloride 0.9 % bolus 500 mL (0 mLs Intravenous Stopped 03/05/17 1247)  metoprolol tartrate (LOPRESSOR) injection 2.5 mg (2.5 mg Intravenous Given 03/05/17 1435)  aspirin suppository 300 mg (300 mg Rectal Given 03/05/17 1614)  iopamidol (ISOVUE-370) 76 % injection 100 mL (100 mLs Intravenous Contrast Given 03/05/17 1532)     Initial Impression / Assessment and Plan / ED Course  I have reviewed the triage vital signs and the nursing notes.  Pertinent labs & imaging results that were available during my care of the patient were reviewed by me and considered in my medical decision making (see chart for details).     83 year old female with a history of Alzheimer's dementia, coronary artery disease, atrial fibrillation, dyslipidemia, hypertension, COPD, presents with concern for syncope.  Labs show no significant anemia or electrolyte abnormal atrial fibrillation with elevated rate on arrival, thought to be possible to mild dehydration.  She is given IV fluids initially, with mild improvement, and then given additional 2.5 mg of metoprolol with improvement of heart rate to the 80s, with intermittent tachycardia.  She is noted to be hypoxic to 88% on room air on arrival, and placed on 2 L of oxygen.  CT PE study was done which showed bilateral pulmonary emboli with possible right heart strain.  Initial troponin was negative, however subsequent troponin was elevated to 0.12.  Troponin elevation may be secondary to right heart strain versus strain related to atrial  fibrillation with RVR.  Discussed with pulmonary critical care attending, Dr. Pearline Cables, and given patient's without significant tachypnea, normal blood pressures, and given age and functional status, I felt it was  unlikely that interventionalists would find her a good candidate for intervention.  Discussed this with family who is also in agreement.  She was initiated on heparin infusion, and will be admitted to the hospitalist for further care.  Final Clinical Impressions(s) / ED Diagnoses   Final diagnoses:  Other acute pulmonary embolism without acute cor pulmonale (HCC)  Troponin level elevated  Atrial fibrillation, unspecified type [facility])    ED Discharge Orders    None       Gareth Morgan, MD 03/05/17 1933

## 2017-03-05 NOTE — Progress Notes (Signed)
ANTICOAGULATION CONSULT NOTE - Initial Consult  Pharmacy Consult for heparin Indication: pulmonary embolus  Allergies  Allergen Reactions  . Codeine Nausea And Vomiting    Pt doesn't remember this reaction   . Tuberculin Tests     Unknown-MAR    Patient Measurements:   Heparin Dosing Weight: 62.1 kg  Vital Signs: Temp: 99.3 F (37.4 C) (12/01 1347) Temp Source: Rectal (12/01 1347) BP: 126/101 (12/01 1630) Pulse Rate: 87 (12/01 1630)  Labs: Recent Labs    03/05/17 1143  HGB 11.4*  HCT 35.2*  PLT 161  CREATININE 0.97    CrCl cannot be calculated (Unknown ideal weight.).   Medical History: Past Medical History:  Diagnosis Date  . Alzheimer disease prolapsed bladder  . Anxiety   . Atrial fibrillation ([place])   . CAD (coronary artery disease), native coronary artery    H/O PTCA, balloon angioplasty Mid LAD in 2001, 4.0x18 vision in proximal RCA 3.2010 and cardiac cath 01/2009 revealed patent PCI sites  . Chronic kidney disease, stage III (moderate) (HCC)   . COPD (chronic obstructive pulmonary disease) ([place])   . Dyslipidemia   . GERD (gastroesophageal reflux disease)   . H/O: GI bleed    Massive rectal bleed 2008. Not a coumadin candidate. A. Fib without recurrence.    Marland Kitchen Hx: UTI (urinary tract infection)   . Hypertension   . Hypothyroid     Medications:  No anticoagulants  Assessment: 83 yo F with new PE.  CT 12/1: extensive B PE with R heart strain. PMH of afib not on anticoagulation.  Called ED x 3 for pt height and wt.  Cr 0.97, Hg 11.4, PLTC WNL,  HDW 62 kg. No bleeding reported.   Goal of Therapy:  Heparin level 0.3-0.7 units/ml Monitor platelets by anticoagulation protocol: Yes   Plan: using [place] Nomogram: Give 2000 units bolus x 1 Start heparin infusion at 900 units/hr Check anti-Xa level in 8 hours and daily while on heparin Continue to monitor H&H and platelets   Eudelia Bunch, Pharm.D. [phone] 03/05/2017 5:52 PM

## 2017-03-05 NOTE — ED Notes (Signed)
Dr. Billy Fischer has just spoken with pt. And her daughter at length about diagnosis, including P.E. And cardiac strain and need to admit, with which they enthusiastically agree.

## 2017-03-06 ENCOUNTER — Other Ambulatory Visit: Payer: Self-pay

## 2017-03-06 ENCOUNTER — Encounter (HOSPITAL_COMMUNITY): Payer: Self-pay

## 2017-03-06 ENCOUNTER — Inpatient Hospital Stay (HOSPITAL_COMMUNITY): Payer: Medicare Other

## 2017-03-06 DIAGNOSIS — I2699 Other pulmonary embolism without acute cor pulmonale: Secondary | ICD-10-CM

## 2017-03-06 LAB — CBC
HCT: 32.7 % — ABNORMAL LOW (ref 36.0–46.0)
Hemoglobin: 10.4 g/dL — ABNORMAL LOW (ref 12.0–15.0)
MCH: 25.8 pg — ABNORMAL LOW (ref 26.0–34.0)
MCHC: 31.8 g/dL (ref 30.0–36.0)
MCV: 81.1 fL (ref 78.0–100.0)
PLATELETS: 190 10*3/uL (ref 150–400)
RBC: 4.03 MIL/uL (ref 3.87–5.11)
RDW: 14.5 % (ref 11.5–15.5)
WBC: 7.8 10*3/uL (ref 4.0–10.5)

## 2017-03-06 LAB — ECHOCARDIOGRAM COMPLETE
Ao-asc: 31 cm
CHL CUP TV REG PEAK VELOCITY: 252 cm/s
EWDT: 176 ms
FS: 17 % — AB (ref 28–44)
HEIGHTINCHES: 64 in
IV/PV OW: 0.78
LA ID, A-P, ES: 47 mm
LA diam index: 2.74 cm/m2
LA vol index: 48.4 mL/m2
LA vol: 83.2 mL
LAVOLA4C: 69.1 mL
LDCA: 3.14 cm2
LEFT ATRIUM END SYS DIAM: 47 mm
LV PW d: 13.5 mm — AB (ref 0.6–1.1)
LVOT diameter: 20 mm
MV Dec: 176
MVPG: 2 mmHg
MVPKAVEL: 48.5 m/s
MVPKEVEL: 72.3 m/s
PV Reg vel dias: 63.4 cm/s
TR max vel: 252 cm/s
WEIGHTICAEL: 2278.67 [oz_av]

## 2017-03-06 LAB — COMPREHENSIVE METABOLIC PANEL
ALBUMIN: 3.2 g/dL — AB (ref 3.5–5.0)
ALT: 12 U/L — ABNORMAL LOW (ref 14–54)
ANION GAP: 9 (ref 5–15)
AST: 18 U/L (ref 15–41)
Alkaline Phosphatase: 70 U/L (ref 38–126)
BUN: 25 mg/dL — ABNORMAL HIGH (ref 6–20)
CO2: 25 mmol/L (ref 22–32)
Calcium: 8.9 mg/dL (ref 8.9–10.3)
Chloride: 107 mmol/L (ref 101–111)
Creatinine, Ser: 0.87 mg/dL (ref 0.44–1.00)
GFR calc Af Amer: >60 mL/min (ref >60)
GFR calc non Af Amer: 57 mL/min — ABNORMAL LOW (ref >60)
GLUCOSE: 133 mg/dL — AB (ref 65–99)
POTASSIUM: 3.8 mmol/L (ref 3.5–5.1)
Sodium: 141 mmol/L (ref 135–145)
TOTAL PROTEIN: 6.2 g/dL — AB (ref 6.5–8.1)
Total Bilirubin: 1.1 mg/dL (ref 0.3–1.2)

## 2017-03-06 LAB — URINE CULTURE: Culture: NO GROWTH

## 2017-03-06 LAB — MRSA PCR SCREENING: MRSA BY PCR: NEGATIVE

## 2017-03-06 LAB — HEPARIN LEVEL (UNFRACTIONATED)
HEPARIN UNFRACTIONATED: 0.75 [IU]/mL — AB (ref 0.30–0.70)
HEPARIN UNFRACTIONATED: 0.24 [IU]/mL — AB (ref 0.30–0.70)
Heparin Unfractionated: 0.48 IU/mL (ref 0.30–0.70)

## 2017-03-06 LAB — MAGNESIUM: Magnesium: 1.7 mg/dL (ref 1.7–2.4)

## 2017-03-06 MED ORDER — CHLORHEXIDINE GLUCONATE 0.12 % MT SOLN
15.0000 mL | Freq: Two times a day (BID) | OROMUCOSAL | Status: DC
  Administered 2017-03-06 – 2017-03-09 (×8): 15 mL via OROMUCOSAL
  Filled 2017-03-06 (×7): qty 15

## 2017-03-06 MED ORDER — HEPARIN (PORCINE) IN NACL 100-0.45 UNIT/ML-% IJ SOLN
1050.0000 [IU]/h | INTRAMUSCULAR | Status: DC
  Administered 2017-03-07: 950 [IU]/h via INTRAVENOUS
  Filled 2017-03-06: qty 250

## 2017-03-06 MED ORDER — ORAL CARE MOUTH RINSE
15.0000 mL | Freq: Two times a day (BID) | OROMUCOSAL | Status: DC
  Administered 2017-03-08 – 2017-03-09 (×2): 15 mL via OROMUCOSAL

## 2017-03-06 MED ORDER — HEPARIN (PORCINE) IN NACL 100-0.45 UNIT/ML-% IJ SOLN
750.0000 [IU]/h | INTRAMUSCULAR | Status: DC
  Administered 2017-03-06: 750 [IU]/h via INTRAVENOUS
  Filled 2017-03-06: qty 250

## 2017-03-06 MED ORDER — SODIUM CHLORIDE 0.9 % IV SOLN
INTRAVENOUS | Status: DC
  Administered 2017-03-06 – 2017-03-09 (×5): via INTRAVENOUS

## 2017-03-06 MED ORDER — HEPARIN BOLUS VIA INFUSION
1000.0000 [IU] | Freq: Once | INTRAVENOUS | Status: AC
  Administered 2017-03-06: 1000 [IU] via INTRAVENOUS
  Filled 2017-03-06: qty 1000

## 2017-03-06 NOTE — Progress Notes (Signed)
ANTICOAGULATION CONSULT NOTE - f/u Consult  Pharmacy Consult for heparin Indication: pulmonary embolus  Allergies  Allergen Reactions  . Codeine Nausea And Vomiting    Pt doesn't remember this reaction   . Tuberculin Tests     Unknown-MAR    Patient Measurements: Height: 5\' 4"  (162.6 cm) Weight: 142 lb 6.7 oz (64.6 kg) IBW/kg (Calculated) : 54.7 Heparin Dosing Weight: 62.1 kg  Vital Signs: Temp: 98.7 F (37.1 C) (12/02 2201) Temp Source: Oral (12/02 2201) BP: 123/89 (12/02 2201) Pulse Rate: 67 (12/02 2201)  Labs: Recent Labs    03/05/17 1143 03/06/17 0425 03/06/17 1235 03/06/17 2122  HGB 11.4* 10.4*  --   --   HCT 35.2* 32.7*  --   --   PLT 161 190  --   --   HEPARINUNFRC  --  0.48 0.75* 0.24*  CREATININE 0.97 0.87  --   --     Estimated Creatinine Clearance: 37.9 mL/min (by C-G formula based on SCr of 0.87 mg/dL).   Medical History: Past Medical History:  Diagnosis Date  . Alzheimer disease prolapsed bladder  . Anxiety   . Atrial fibrillation ([place])   . CAD (coronary artery disease), native coronary artery    H/O PTCA, balloon angioplasty Mid LAD in 2001, 4.0x18 vision in proximal RCA 3.2010 and cardiac cath 01/2009 revealed patent PCI sites  . Chronic kidney disease, stage III (moderate) (HCC)   . COPD (chronic obstructive pulmonary disease) ([place])   . Dyslipidemia   . GERD (gastroesophageal reflux disease)   . H/O: GI bleed    Massive rectal bleed 2008. Not a coumadin candidate. A. Fib without recurrence.    Marland Kitchen Hx: UTI (urinary tract infection)   . Hypertension   . Hypothyroid     Medications:  No anticoagulants  Assessment: 83 yo F with new PE.  CT 12/1: extensive B PE with R heart strain. PMH of afib not on anticoagulation.   Bilateral lower extremity venous duplex showed evidence of age indeterminate deep vein thrombosis involving the distal femoral vein of the right lower extremity per vascular lab notes.  Today, 03/06/2017: - 1st heparin  level 0.48 units/ml (therapeutic) but repeat heparin level this afternoon is slightly elevated at 0.75 units/ml with infusion at 900 units/hr. - Hgb decrease slightly from admission, plts WNL.  -2122 HL=0.24 units/ml slightly below goal. No infusion or bleeding complications per RN.   Goal of Therapy:  Heparin level 0.3-0.7 units/ml Monitor platelets by anticoagulation protocol: Yes   Plan:  Rebolus with 1000 units IV heparin now Increase heparin drip to 850 units/hr Recheck HL in 8 hours   Dorrene German 03/06/2017 11:22 PM

## 2017-03-06 NOTE — Progress Notes (Addendum)
PROGRESS NOTE    Patient: [name]     PCP: Sande Brothers, MD                    DOB: [birth year]            DOA: 03/05/2017 JAS:[id number]             DOS: 03/06/2017, 1:52 PM  Date of Service: the patient was seen and examined on 03/06/2017  ---------------------------------------------------------------------------------------------------------------------- Brief Narrative:  [name] is a 83 y.o. female with severeAlzheimer's dementiawith no short term memory,very poor hearing,A-fib, not on anticoagulation possibly due to fall,  CAD with stent in RCA 2010, CKD 3, hypothyroid, she is sent from  [facility] ward due to passing out and vomiting.  Patient is confused not able to provide history.  HPI obtained by talking to daughter, chart review and talking to EDP.  ED course: On arrival she is hypoxia, 02 88% on room air, afib/ tachycardia, heart rate 122.  Blood pressure 102/50.  CBC with mild anemia hemoglobin 11.4, creatinine 0.97, lactic acid 2.4, troponin 0.12, Extensive bilateral pulmonary embolism. CT evidence of right heart strain (RV/LV Ratio = 1.4) consistent with at least submassive (intermediate risk) PE.  EDP discussed case with critical care doctor Veleta Miners who recommended medicine admission with heparin drip.  Per Dr. Pearline Cables patient is not a candidate for aggressive intervention due to advanced dementia and age.  Heparin drip ordered by EDP hospitalist called to admit the patient.  Daughter reports patient recently has some bleeding issues.  She was evaluated by GYN physician who did not think patient has vaginal bleed.  Daughter thinks the patient has blood in urine.  UA obtained in the ED no blood.  Daughter reported no blood in the stool.  daughter reports  patient has been ambulatory, no recent falls. ----------------------------------------------------------------------------------------------------------------------  Subjective: Patient was seen and  examined.  Stable, no acute issues overnight.  Assessment & Plan:  Active Problems:   Atrial fibrillation with RVR (HCC)   Alzheimer disease   Anxiety   Hypertension   COPD (chronic obstructive pulmonary disease) (HCC)   Chronic kidney disease, stage III (moderate) (HCC)   GERD (gastroesophageal reflux disease)   Dyslipidemia   Hypothyroid   CAD (coronary artery disease)   Fall   Pulmonary emboli (HCC)  Syncope likely due to acute submassive PE with right heart strain, acute hypoxic respiratory failure /Righ lower extremity DVT -  We will continue heparin drip, monitor for any bleeding -Echocardiogram: Reporting moderate LVH, normal systolic function, increased RV pressure mild MR, dilated right ventricle right atrium dilated pulmonary artery systolic pressure moderately increased is 40 mmHg  -Dissipating to initiate oral anticoagulation with Eliquis, once stable next 24-48 hours.  Daughter reports patient has advanced dementia but has been ambulatory, no recent falls. Patient does has intermittent blood in the urine recently.  Does not have significant anemia.  No blood in the urine here.  Daughter understand if patient developed life-threatening bleed, she will be taken off anticoagulation.  At that point she will consider palliative care.  A. fib RVR - heart rate control continue home Cardizem,   History of CAD status post stent many years ago -Dr. Einar Gip is her cardiologist -Currently mild troponin elevation likely from PE, no complaint of chest pain -Started on heparin drip, stopped Plavix to minimize risk of bleeding  Hypothyroidism continue Synthroid  Advanced dementia, only oriented to self at baseline.  Baseline ambulatory and pleasant.  continue Namenda  DVT prophylaxis: on heparin drip  Consultants:  EDP discussed case with critical care Dr Veleta Miners  Code Status: DNR  Family Communication:  Patient and daughter POA   DVT prophylaxis: Heparin drip  Code  Status:        DNR/DNI  Family Communication:  The above findings and plan of care has been discussed with patient and family in detail, they expressed understanding and agreement of above.  Disposition Plan:  2-3 days            SNF Procedures:  Imaging studies:  CTA/bilateral lower extremity Doppler study/echocardiogram  Antimicrobials:  Anti-infectives (From admission, onward)   None       Objective: Vitals:   03/05/17 1828 03/05/17 1900 03/05/17 2043 03/06/17 0704  BP: (!) 135/91  116/81 (!) 137/112  Pulse: 65  98 94  Resp: 20  20 16   Temp: 99.1 F (37.3 C)  98.8 F (37.1 C) 98.1 F (36.7 C)  TempSrc: Oral  Oral Oral  SpO2: 91%  100% 96%  Weight:  64.6 kg (142 lb 6.7 oz)    Height:        Intake/Output Summary (Last 24 hours) at 03/06/2017 1352 Last data filed at 03/06/2017 1022 Gross per 24 hour  Intake 296.43 ml  Output 200 ml  Net 96.43 ml   Filed Weights   03/05/17 1730 03/05/17 1900  Weight: 62.1 kg (137 lb) 64.6 kg (142 lb 6.7 oz)    Examination:  General exam: Appears calm and comfortable, baseline confusion Respiratory system: Clear to auscultation. Respiratory effort normal. Cardiovascular system: S1 & S2 heard, RRR. No JVD, murmurs, rubs, gallops or clicks. No pedal edema. Gastrointestinal system: Abdomen is nondistended, soft and nontender. No organomegaly or masses felt. Normal bowel sounds heard. Central nervous system: Alert and oriented. No focal neurological deficits. Extremities: Symmetric 5 x 5 power. Skin: No rashes, lesions or ulcers Psychiatry: Judgement and insight appear normal. Mood & affect appropriate.     Data Reviewed: I have personally reviewed following labs and imaging studies  CBC: Recent Labs  Lab 03/05/17 1143 03/06/17 0425  WBC 9.5 7.8  NEUTROABS 8.5*  --   HGB 11.4* 10.4*  HCT 35.2* 32.7*  MCV 81.7 81.1  PLT 161 086   Basic Metabolic Panel: Recent Labs  Lab 03/05/17 1143 03/06/17 0425  NA 138 141  K  3.8 3.8  CL 100* 107  CO2 25 25  GLUCOSE 189* 133*  BUN 28* 25*  CREATININE 0.97 0.87  CALCIUM 8.8* 8.9  MG  --  1.7   GFR: Estimated Creatinine Clearance: 37.9 mL/min (by C-G formula based on SCr of 0.87 mg/dL). Liver Function Tests: Recent Labs  Lab 03/05/17 1143 03/06/17 0425  AST 17 18  ALT 11* 12*  ALKPHOS 72 70  BILITOT 1.0 1.1  PROT 6.7 6.2*  ALBUMIN 3.4* 3.2*   No results for input(s): LIPASE, AMYLASE in the last 168 hours. No results for input(s): AMMONIA in the last 168 hours. Coagulation Profile: No results for input(s): INR, PROTIME in the last 168 hours. Cardiac Enzymes: No results for input(s): CKTOTAL, CKMB, CKMBINDEX, TROPONINI in the last 168 hours. BNP (last 3 results) No results for input(s): PROBNP in the last 8760 hours. HbA1C: No results for input(s): HGBA1C in the last 72 hours. CBG: No results for input(s): GLUCAP in the last 168 hours. Lipid Profile: No results for input(s): CHOL, HDL, LDLCALC, TRIG, CHOLHDL, LDLDIRECT in the last 72 hours. Thyroid Function Tests:  No results for input(s): TSH, T4TOTAL, FREET4, T3FREE, THYROIDAB in the last 72 hours. Anemia Panel: No results for input(s): VITAMINB12, FOLATE, FERRITIN, TIBC, IRON, RETICCTPCT in the last 72 hours. Sepsis Labs: Recent Labs  Lab 03/05/17 1148 03/05/17 1255  LATICACIDVEN 2.44* 1.78    Recent Results (from the past 240 hour(s))  MRSA PCR Screening     Status: None   Collection Time: 03/05/17  6:54 PM  Result Value Ref Range Status   MRSA by PCR NEGATIVE NEGATIVE Final    Comment:        The GeneXpert MRSA Assay (FDA approved for NASAL specimens only), is one component of a comprehensive MRSA colonization surveillance program. It is not intended to diagnose MRSA infection nor to guide or monitor treatment for MRSA infections.        Radiology Studies: Dg Chest 2 View  Result Date: 03/05/2017 CLINICAL DATA:  Per order- syncope Staff at [employer] saw pt. To  have an episode of emesis at breakfast ?choked?, at which time pt. Was seen to "pass out" and fall. PT HX: ex smoker 1960, COPD, HTN Best images obtained due to mental statu.*comment was truncated* EXAM: CHEST  2 VIEW COMPARISON:  Radiograph 05/30/2016 FINDINGS: Normal cardiac silhouette. Low lung volumes. Density projecting over the LEFT lower lobe is unchanged from prior likely represents a benign rib lesion. IMPRESSION: Low lung volumes.  No acute cardiopulmonary findings Electronically Signed   By: Suzy Bouchard M.D.   On: 03/05/2017 10:53   Ct Angio Chest Pe W And/or Wo Contrast  Result Date: 03/05/2017 CLINICAL DATA:  Shortness of breath. Syncopal episode today. Clinical suspicion for pulmonary embolism. EXAM: CT ANGIOGRAPHY CHEST WITH CONTRAST TECHNIQUE: Multidetector CT imaging of the chest was performed using the standard protocol during bolus administration of intravenous contrast. Multiplanar CT image reconstructions and MIPs were obtained to evaluate the vascular anatomy. CONTRAST:  166mL ISOVUE-370 IOPAMIDOL (ISOVUE-370) INJECTION 76% COMPARISON:  02/01/2007 FINDINGS: Cardiovascular: Extensive pulmonary embolism is seen in both pulmonary arteries and all lobar branches. RV/LV ratio is 1.4, consistent with right heart strain. No evidence of saddle embolus. Mild cardiomegaly, without pericardial effusion. Aortic and coronary artery atherosclerosis. Mediastinum/Nodes: No evidence of lymphadenopathy or other soft tissue masses. Lungs/Pleura: Mild atelectasis seen in both lower lobes and right upper lobe. No evidence of pulmonary consolidation or pleural effusion. No masses identified. Upper abdomen: No acute findings. Musculoskeletal: No suspicious bone lesions identified. Review of the MIP images confirms the above findings. IMPRESSION: Extensive bilateral pulmonary embolism. CT evidence of right heart strain (RV/LV Ratio = 1.4) consistent with at least submassive (intermediate risk) PE. The  presence of right heart strain has been associated with an increased risk of morbidity and mortality. Please activate Code PE by paging [phone]. Critical Value/emergent results were called by telephone at the time of interpretation on 03/05/2017 at 4:16 pm to Dr. Gareth Morgan , who verbally acknowledged these results. Electronically Signed   By: Earle Gell M.D.   On: 03/05/2017 16:18    Scheduled Meds: . chlorhexidine  15 mL Mouth Rinse BID  . cholecalciferol  5,000 Units Oral Daily  . citalopram  10 mg Oral Daily  . diltiazem  180 mg Oral Daily  . docusate sodium  100 mg Oral Daily  . levothyroxine  37.5 mcg Oral QAC breakfast  . mouth rinse  15 mL Mouth Rinse q12n4p  . memantine  28 mg Oral Daily  . montelukast  10 mg Oral QHS   Continuous Infusions: .  sodium chloride 50 mL/hr at 03/06/17 0153  . heparin 750 Units/hr (03/06/17 1321)     LOS: 1 day    Time spent: [place], MD [facility] Pager [phone]  If 7PM-7AM, please contact night-coverage [email or web address] Password [email or web address] 03/06/2017, 1:52 PM

## 2017-03-06 NOTE — Progress Notes (Signed)
ANTICOAGULATION CONSULT NOTE - f/u Consult  Pharmacy Consult for heparin Indication: pulmonary embolus  Allergies  Allergen Reactions  . Codeine Nausea And Vomiting    Pt doesn't remember this reaction   . Tuberculin Tests     Unknown-MAR    Patient Measurements: Height: 5\' 4"  (162.6 cm) Weight: 142 lb 6.7 oz (64.6 kg) IBW/kg (Calculated) : 54.7 Heparin Dosing Weight: 62.1 kg  Vital Signs: Temp: 98.1 F (36.7 C) (12/02 0704) Temp Source: Oral (12/02 0704) BP: 137/112 (12/02 0704) Pulse Rate: 94 (12/02 0704)  Labs: Recent Labs    03/05/17 1143 03/06/17 0425 03/06/17 1235  HGB 11.4* 10.4*  --   HCT 35.2* 32.7*  --   PLT 161 190  --   HEPARINUNFRC  --  0.48 0.75*  CREATININE 0.97 0.87  --     Estimated Creatinine Clearance: 37.9 mL/min (by C-G formula based on SCr of 0.87 mg/dL).   Medical History: Past Medical History:  Diagnosis Date  . Alzheimer disease prolapsed bladder  . Anxiety   . Atrial fibrillation ([place])   . CAD (coronary artery disease), native coronary artery    H/O PTCA, balloon angioplasty Mid LAD in 2001, 4.0x18 vision in proximal RCA 3.2010 and cardiac cath 01/2009 revealed patent PCI sites  . Chronic kidney disease, stage III (moderate) (HCC)   . COPD (chronic obstructive pulmonary disease) ([place])   . Dyslipidemia   . GERD (gastroesophageal reflux disease)   . H/O: GI bleed    Massive rectal bleed 2008. Not a coumadin candidate. A. Fib without recurrence.    Marland Kitchen Hx: UTI (urinary tract infection)   . Hypertension   . Hypothyroid     Medications:  No anticoagulants  Assessment: 83 yo F with new PE.  CT 12/1: extensive B PE with R heart strain. PMH of afib not on anticoagulation.   Bilateral lower extremity venous duplex showed evidence of age indeterminate deep vein thrombosis involving the distal femoral vein of the right lower extremity per vascular lab notes.  Today, 03/06/2017: - 1st heparin level 0.48 units/ml (therapeutic) but repeat  heparin level this afternoon is slightly elevated at 0.75 units/ml with infusion at 900 units/hr. - Hgb decrease slightly from admission, plts WNL. No bleeding/complications reported.   Goal of Therapy:  Heparin level 0.3-0.7 units/ml Monitor platelets by anticoagulation protocol: Yes   Plan:  Reduce heparin drip to 750 units/hr Recheck HL in 8 hours   Hershal Coria 03/06/2017 1:11 PM

## 2017-03-06 NOTE — Progress Notes (Signed)
  Echocardiogram 2D Echocardiogram has been performed.  Katleen Carraway G Gilmer Kaminsky 03/06/2017, 8:57 AM

## 2017-03-06 NOTE — Progress Notes (Signed)
Bilateral lower extremity venous duplex has been completed. There is evidence of age indeterminate deep vein thrombosis involving the distal femoral vein of the right lower extremity. Results were given to the patient's nurse, Brooke.  03/06/17 9:22 AM [name] RVT

## 2017-03-06 NOTE — Progress Notes (Signed)
ANTICOAGULATION CONSULT NOTE - f/u Consult  Pharmacy Consult for heparin Indication: pulmonary embolus  Allergies  Allergen Reactions  . Codeine Nausea And Vomiting    Pt doesn't remember this reaction   . Tuberculin Tests     Unknown-MAR    Patient Measurements: Height: 5\' 4"  (162.6 cm) Weight: 142 lb 6.7 oz (64.6 kg) IBW/kg (Calculated) : 54.7 Heparin Dosing Weight: 62.1 kg  Vital Signs: Temp: 98.8 F (37.1 C) (12/01 2043) Temp Source: Oral (12/01 2043) BP: 116/81 (12/01 2043) Pulse Rate: 98 (12/01 2043)  Labs: Recent Labs    03/05/17 1143 03/06/17 0425  HGB 11.4* 10.4*  HCT 35.2* 32.7*  PLT 161 190  HEPARINUNFRC  --  0.48  CREATININE 0.97 0.87    Estimated Creatinine Clearance: 37.9 mL/min (by C-G formula based on SCr of 0.87 mg/dL).   Medical History: Past Medical History:  Diagnosis Date  . Alzheimer disease prolapsed bladder  . Anxiety   . Atrial fibrillation ([place])   . CAD (coronary artery disease), native coronary artery    H/O PTCA, balloon angioplasty Mid LAD in 2001, 4.0x18 vision in proximal RCA 3.2010 and cardiac cath 01/2009 revealed patent PCI sites  . Chronic kidney disease, stage III (moderate) (HCC)   . COPD (chronic obstructive pulmonary disease) ([place])   . Dyslipidemia   . GERD (gastroesophageal reflux disease)   . H/O: GI bleed    Massive rectal bleed 2008. Not a coumadin candidate. A. Fib without recurrence.    Marland Kitchen Hx: UTI (urinary tract infection)   . Hypertension   . Hypothyroid     Medications:  No anticoagulants  Assessment: 83 yo F with new PE.  CT 12/1: extensive B PE with R heart strain. PMH of afib not on anticoagulation.  Called ED x 3 for pt height and wt.  Cr 0.97, Hg 11.4, PLTC WNL,  HDW 62 kg. No bleeding reported.  Today, 12/2 0425 HL = 0.48 at goal, no infusion or bleeding issues Goal of Therapy:  Heparin level 0.3-0.7 units/ml Monitor platelets by anticoagulation protocol: Yes   Plan: using [place]  Nomogram: Continue heparin drip at 900 units/hr Recheck Hl in 8 hours   Dorrene German 03/06/2017 5:51 AM

## 2017-03-06 NOTE — Plan of Care (Signed)
LA improved

## 2017-03-07 ENCOUNTER — Encounter (HOSPITAL_COMMUNITY): Payer: Self-pay | Admitting: *Deleted

## 2017-03-07 DIAGNOSIS — I2699 Other pulmonary embolism without acute cor pulmonale: Principal | ICD-10-CM

## 2017-03-07 LAB — CBC
HCT: 33.5 % — ABNORMAL LOW (ref 36.0–46.0)
Hemoglobin: 10.8 g/dL — ABNORMAL LOW (ref 12.0–15.0)
MCH: 26.2 pg (ref 26.0–34.0)
MCHC: 32.2 g/dL (ref 30.0–36.0)
MCV: 81.3 fL (ref 78.0–100.0)
PLATELETS: 190 10*3/uL (ref 150–400)
RBC: 4.12 MIL/uL (ref 3.87–5.11)
RDW: 14.5 % (ref 11.5–15.5)
WBC: 8.3 10*3/uL (ref 4.0–10.5)

## 2017-03-07 LAB — HEPARIN LEVEL (UNFRACTIONATED)
HEPARIN UNFRACTIONATED: 0.24 [IU]/mL — AB (ref 0.30–0.70)
Heparin Unfractionated: 0.32 IU/mL (ref 0.30–0.70)

## 2017-03-07 LAB — PROTIME-INR
INR: 1.21
PROTHROMBIN TIME: 15.2 s (ref 11.4–15.2)

## 2017-03-07 NOTE — Progress Notes (Signed)
ANTICOAGULATION CONSULT NOTE - f/u Consult  Pharmacy Consult for heparin Indication: atrial fibrillation, pulmonary embolus and DVT  Allergies  Allergen Reactions  . Codeine Nausea And Vomiting    Pt doesn't remember this reaction   . Tuberculin Tests     Unknown-MAR    Patient Measurements: Height: 5\' 4"  (162.6 cm) Weight: 142 lb 6.7 oz (64.6 kg) IBW/kg (Calculated) : 54.7 Heparin Dosing Weight: 62.1 kg  Vital Signs: Temp: 99 F (37.2 C) (12/03 0540) Temp Source: Oral (12/03 0540) BP: 132/73 (12/03 0540) Pulse Rate: 98 (12/03 0540)  Labs: Recent Labs    03/05/17 1143 03/06/17 0425 03/06/17 1235 03/06/17 2122  HGB 11.4* 10.4*  --   --   HCT 35.2* 32.7*  --   --   PLT 161 190  --   --   HEPARINUNFRC  --  0.48 0.75* 0.24*  CREATININE 0.97 0.87  --   --     Estimated Creatinine Clearance: 37.9 mL/min (by C-G formula based on SCr of 0.87 mg/dL).  Medications:  . sodium chloride 50 mL/hr at 03/06/17 1805  . heparin 850 Units/hr (03/06/17 2339)    Assessment: 83 yo F with new PE.  CT 12/1: extensive B PE with R heart strain. PMH of afib not on anticoagulation.  Bilateral lower extremity venous duplex showed evidence of age indeterminate deep vein thrombosis involving the distal femoral vein of the right lower extremity per vascular lab notes.  Today, 03/07/2017: - Heparin level 0.32, therapeutic after bolus and heparin at 850 units/hr - CBC: Hgb 10.8, Plt 190 (stable) - No bleeding or complications reported.  No RBC in UA on 12/1.  Goal of Therapy:  Heparin level 0.3-0.7 units/ml Monitor platelets by anticoagulation protocol: Yes   Plan:   Continue heparin IV infusion at 850 units/hr  Heparin level in 8 hours to confirm therapeutic rate.  Daily heparin level and CBC  Follow up long term anticoagulation plan   Gretta Arab PharmD, BCPS Pager [phone] 03/07/2017 7:14 AM

## 2017-03-07 NOTE — Consult Note (Signed)
PULMONARY / CRITICAL CARE MEDICINE   Name: [name] MRN: [id number] DOB: 1925/07/26    ADMISSION DATE:  03/05/2017 CONSULTATION DATE:  03/07/2017  REFERRING MD:  Dr. Roger Shelter  CHIEF COMPLAINT:  Want to do with anticoagulation  HISTORY OF PRESENT ILLNESS:   83 yo female brought to ER with syncope.  Found to have PE.  Started on heparin.  Hospitalist had question about oral anticoagulation.    Pt's daughter states the pt has dementia.  She lives in nursing home.  She is able to walk, and no recent fall activities.  No hx of GI bleeding.  PAST MEDICAL HISTORY :  She  has a past medical history of Alzheimer disease (prolapsed bladder), Anxiety, Atrial fibrillation ([place]), CAD (coronary artery disease), native coronary artery, Chronic kidney disease, stage III (moderate) ([place]), COPD (chronic obstructive pulmonary disease) ([place]), Dyslipidemia, GERD (gastroesophageal reflux disease), H/O: GI bleed, UTI (urinary tract infection), Hypertension, and Hypothyroid.  PAST SURGICAL HISTORY: She  has a past surgical history that includes Coronary angioplasty with stent (bladder tacking); myocardial infarct; Total vaginal hysterectomy; and Abdominal hysterectomy.  Allergies  Allergen Reactions  . Codeine Nausea And Vomiting    Pt doesn't remember this reaction   . Tuberculin Tests     Unknown-MAR    No current facility-administered medications on file prior to encounter.    Current Outpatient Medications on File Prior to Encounter  Medication Sig  . acetaminophen (TYLENOL) 500 MG tablet Take 1,000 mg by mouth every 6 (six) hours as needed for mild pain.   Marland Kitchen alum & mag hydroxide-simeth (MAALOX/MYLANTA) 200-200-20 MG/5ML suspension Take 30 mLs by mouth every 6 (six) hours as needed for indigestion or heartburn.  . Cholecalciferol (VITAMIN D-3) 5000 UNITS TABS Take 1 tablet by mouth daily.  . citalopram (CELEXA) 10 MG tablet Take 10 mg by mouth daily.    . clopidogrel (PLAVIX) 75 MG tablet Take  75 mg by mouth daily.  Marland Kitchen diltiazem (DILACOR XR) 180 MG 24 hr capsule Take 1 capsule (180 mg total) by mouth daily.  Marland Kitchen docusate sodium (COLACE) 100 MG capsule Take 100 mg by mouth daily.  Marland Kitchen guaifenesin (ROBITUSSIN) 100 MG/5ML syrup Take 200 mg by mouth every 6 (six) hours as needed for cough.   . hydrocortisone cream 1 % Apply 1 application topically 2 (two) times daily as needed for itching.  . levothyroxine (SYNTHROID, LEVOTHROID) 75 MCG tablet Take 37.5 mcg by mouth daily before breakfast.  . loperamide (IMODIUM) 2 MG capsule Take 2 mg by mouth as needed for diarrhea or loose stools.  . magnesium hydroxide (MILK OF MAGNESIA) 400 MG/5ML suspension Take 30 mLs by mouth at bedtime as needed for mild constipation.   . Memantine HCl ER (NAMENDA XR) 28 MG CP24 Take 28 mg by mouth daily.  . montelukast (SINGULAIR) 10 MG tablet Take 10 mg by mouth at bedtime.  Marland Kitchen neomycin-bacitracin-polymyxin (NEOSPORIN) ointment Apply 1 application topically every 12 (twelve) hours. As needed for skin tears  . nitroGLYCERIN (NITROSTAT) 0.4 MG SL tablet Place 0.4 mg under the tongue every 5 (five) minutes as needed for chest pain.  Marland Kitchen PRESCRIPTION MEDICATION Take 1 Scoop by mouth daily.    FAMILY HISTORY:  Her has no family status information on file.    SOCIAL HISTORY: She  reports that she quit smoking about 58 years ago. she has never used smokeless tobacco. She reports that she does not drink alcohol or use drugs.  REVIEW OF SYSTEMS:   Denies chest  pain, dyspnea.   VITAL SIGNS: BP 130/89 (BP Location: Right Arm)   Pulse (!) 111   Temp 98.7 F (37.1 C) (Oral)   Resp 18   Ht 5\' 4"  (1.626 m)   Wt 142 lb 6.7 oz (64.6 kg)   SpO2 98%   BMI 24.45 kg/m   INTAKE / OUTPUT: I/O last 3 completed shifts: In: 1967.7 [P.O.:300; I.V.:1667.7] Out: 800 [Urine:800]  PHYSICAL EXAMINATION:  General - pleasant Eyes - pupils reactive ENT - no sinus tenderness, no oral exudate, no LAN Cardiac - regular, no  murmur Chest - no wheeze, rales Abd - soft, non tender Ext - no edema Skin - no rashes Neuro - normal strength, confused Psych - normal mood   LABS:  BMET Recent Labs  Lab 03/05/17 1143 03/06/17 0425  NA 138 141  K 3.8 3.8  CL 100* 107  CO2 25 25  BUN 28* 25*  CREATININE 0.97 0.87  GLUCOSE 189* 133*    Electrolytes Recent Labs  Lab 03/05/17 1143 03/06/17 0425  CALCIUM 8.8* 8.9  MG  --  1.7    CBC Recent Labs  Lab 03/05/17 1143 03/06/17 0425 03/07/17 0817  WBC 9.5 7.8 8.3  HGB 11.4* 10.4* 10.8*  HCT 35.2* 32.7* 33.5*  PLT 161 190 190    Coag's No results for input(s): APTT, INR in the last 168 hours.  Sepsis Markers Recent Labs  Lab 03/05/17 1148 03/05/17 1255  LATICACIDVEN 2.44* 1.78    ABG No results for input(s): PHART, PCO2ART, PO2ART in the last 168 hours.  Liver Enzymes Recent Labs  Lab 03/05/17 1143 03/06/17 0425  AST 17 18  ALT 11* 12*  ALKPHOS 72 70  BILITOT 1.0 1.1  ALBUMIN 3.4* 3.2*    Cardiac Enzymes No results for input(s): TROPONINI, PROBNP in the last 168 hours.  Glucose No results for input(s): GLUCAP in the last 168 hours.  Imaging Dg Chest 2 View  Result Date: 03/05/2017 CLINICAL DATA:  Per order- syncope Staff at [facility] saw pt. To have an episode of emesis at breakfast ?choked?, at which time pt. Was seen to "pass out" and fall. PT HX: ex smoker 1960, COPD, HTN Best images obtained due to mental statu.*comment was truncated* EXAM: CHEST  2 VIEW COMPARISON:  Radiograph 05/30/2016 FINDINGS: Normal cardiac silhouette. Low lung volumes. Density projecting over the LEFT lower lobe is unchanged from prior likely represents a benign rib lesion. IMPRESSION: Low lung volumes.  No acute cardiopulmonary findings Electronically Signed   By: Suzy Bouchard M.D.   On: 03/05/2017 10:53   Ct Angio Chest Pe W And/or Wo Contrast  Result Date: 03/05/2017 CLINICAL DATA:  Shortness of breath. Syncopal episode today. Clinical  suspicion for pulmonary embolism. EXAM: CT ANGIOGRAPHY CHEST WITH CONTRAST TECHNIQUE: Multidetector CT imaging of the chest was performed using the standard protocol during bolus administration of intravenous contrast. Multiplanar CT image reconstructions and MIPs were obtained to evaluate the vascular anatomy. CONTRAST:  174mL ISOVUE-370 IOPAMIDOL (ISOVUE-370) INJECTION 76% COMPARISON:  02/01/2007 FINDINGS: Cardiovascular: Extensive pulmonary embolism is seen in both pulmonary arteries and all lobar branches. RV/LV ratio is 1.4, consistent with right heart strain. No evidence of saddle embolus. Mild cardiomegaly, without pericardial effusion. Aortic and coronary artery atherosclerosis. Mediastinum/Nodes: No evidence of lymphadenopathy or other soft tissue masses. Lungs/Pleura: Mild atelectasis seen in both lower lobes and right upper lobe. No evidence of pulmonary consolidation or pleural effusion. No masses identified. Upper abdomen: No acute findings. Musculoskeletal: No suspicious bone  lesions identified. Review of the MIP images confirms the above findings. IMPRESSION: Extensive bilateral pulmonary embolism. CT evidence of right heart strain (RV/LV Ratio = 1.4) consistent with at least submassive (intermediate risk) PE. The presence of right heart strain has been associated with an increased risk of morbidity and mortality. Please activate Code PE by paging [phone]. Critical Value/emergent results were called by telephone at the time of interpretation on 03/05/2017 at 4:16 pm to Dr. Gareth Morgan , who verbally acknowledged these results. Electronically Signed   By: Earle Gell M.D.   On: 03/05/2017 16:18    ASSESSMENT / PLAN:  Acute pulmonary embolism. - discussed different options for anticoagulation with pt's daughter - explained duration of anticoagulation is determine by balancing risk of recurrent clot versus risk of bleeding - at this time main concern for bleeding risk relates to fall  risk - she is still at significant risk for recurrent clot  - therefore, recommend she be transitioned to warfarin.  This will allow option for anticoagulation reversal in the event she has bleeding complication  Defer further management to hospitalist.  Chesley Mires, MD [place] 03/07/2017, 3:52 PM Pager:  [phone] After 3pm call: [phone]

## 2017-03-07 NOTE — Progress Notes (Signed)
Brief Pharmacy Note re: Heparin  O: Hep Level: 0.24 on 850 units/hr (goal 0.3-0.7)      No bleeding or infusion related issues reported by RN  A/P:  Increase heparin to 950 units/hr          Re-check 8h heparin level   Netta Cedars, PharmD, BCPS 03/07/2017@6 :49 PM

## 2017-03-07 NOTE — Progress Notes (Addendum)
PROGRESS NOTE    Patient: [name]     PCP: Sande Brothers, MD                    DOB: 05-22-1925            DOA: 03/05/2017 URK:[id number]             DOS: 03/07/2017, 12:17 PM  Date of Service: the patient was seen and examined on 03/07/2017   Subjective: Patient was seen and examined this a.m.  Stable no acute distress. Nursing staff report of no issues overnight including any bleeding.  Pleasantly confused at baseline    ---------------------------------------------------------------------------------------------------------------------- Brief Narrative:  [name] is a 83 y.o. female with severeAlzheimer's dementiawith no short term memory,very poor hearing,A-fib, not on anticoagulation possibly due to fall,  CAD with stent in RCA 2010, CKD 3, hypothyroid, she is sent from  [place] ward due to passing out and vomiting.  Patient is confused not able to provide history.  HPI obtained by talking to daughter, chart review and talking to EDP.  ED course: On arrival she is hypoxia, 02 88% on room air, afib/ tachycardia, heart rate 122.  Blood pressure 102/50.  CBC with mild anemia hemoglobin 11.4, creatinine 0.97, lactic acid 2.4, troponin 0.12, Extensive bilateral pulmonary embolism. CT evidence of right heart strain (RV/LV Ratio = 1.4) consistent with at least submassive (intermediate risk) PE.  EDP discussed case with critical care doctor Veleta Miners who recommended medicine admission with heparin drip.  Per Dr. Pearline Cables patient is not a candidate for aggressive intervention due to advanced dementia and age.  Heparin drip ordered by EDP hospitalist called to admit the patient.  Daughter reports patient recently has some bleeding issues.  She was evaluated by GYN physician who did not think patient has vaginal bleed.  Daughter thinks the patient has blood in urine.  UA obtained in the ED no blood.  Daughter reported no blood in the stool.  daughter reports  patient has been  ambulatory, no recent falls. ----------------------------------------------------------------------------------------------------------------------  Assessment & Plan:  Syncope likely due to acute submassive PE with right heart strain, acute hypoxic respiratory failure / Righ lower extremity DVT -  We will continue heparin drip, monitor for any bleeding -Echocardiogram: Reporting moderate LVH, normal systolic function, increased RV pressure mild MR, dilated right ventricle right atrium dilated pulmonary artery systolic pressure moderately increased is 40 mmHg  -Anticipating to initiate oral anticoagulation with Eliquis, once stable next 48 hours.  We have contacted hematology team Dr. Learta Codding, who had no further recommendation with exception of continuing heparin drip for few days then switching turned out of p.o. anticoagulation therapy, he stated that duration of therapy may be determined as an outpatient by PCP.  Pulmonary critical care was also consulted for input  Daughter reports patient has advanced dementia but has been ambulatory, no recent falls. Patient does has intermittent blood in the urine recently.  Does not have significant anemia.  No blood in the urine here.  Daughter understand if patient developed life-threatening bleed, she will be taken off anticoagulation.  At that point she will consider palliative care.  A. fib RVR - heart rate control continue home Cardizem,   History of CAD status post stent many years ago -Dr. Einar Gip is her cardiologist -Currently mild troponin elevation likely from PE, no complaint of chest pain -Started on heparin drip, stopped Plavix to minimize risk of bleeding  Hypothyroidism continue Synthroid  Advanced dementia, only  oriented to self at baseline.  Baseline ambulatory and pleasant. continue Namenda  DVT prophylaxis: on heparin drip  Consultants:  EDP discussed case with critical care Dr Veleta Miners  Code Status: DNR  Family  Communication:  Patient and daughter POA   DVT prophylaxis: Heparin drip  Code Status:        DNR/DNI  Family Communication:  The above findings and plan of care has been discussed with patient and family in detail, they expressed understanding and agreement of above.  Disposition Plan:  2-3 days            SNF Procedures:  Imaging studies:  CTA/bilateral lower extremity Doppler study/echocardiogram  Antimicrobials:  Anti-infectives (From admission, onward)   None       Objective: Vitals:   03/06/17 1432 03/06/17 2201 03/07/17 0540 03/07/17 1056  BP: (!) 134/98 123/89 132/73 (!) 135/94  Pulse: 66 67 98   Resp: 18 18 18    Temp: 98.4 F (36.9 C) 98.7 F (37.1 C) 99 F (37.2 C)   TempSrc: Oral Oral Oral   SpO2: 93% 92% 93%   Weight:      Height:        Intake/Output Summary (Last 24 hours) at 03/07/2017 1217 Last data filed at 03/07/2017 0700 Gross per 24 hour  Intake 1671.25 ml  Output 600 ml  Net 1071.25 ml   Filed Weights   03/05/17 1730 03/05/17 1900  Weight: 62.1 kg (137 lb) 64.6 kg (142 lb 6.7 oz)    Examination:  General exam: Pleasantly confused, no acute distress, stable Respiratory system: Clear to auscultate bilaterally with wheezing or crackles positive breath sounds diffusely Cardiovascular system: Irregularly irregular, S1-S2, negative any gallops,  Gastrointestinal system: Soft, nontender, nondistended, positive bowel sounds Central nervous system: Pleasantly confused, cranial through 12 within normal limits, sensory more intact. Extremities: Strength 5 out of 5 in upper and lower extremities Skin: Warm to touch and a few rashes need for any open wounds Psychiatry: Pleasantly confused, with poor judgment and poor insight.     Data Reviewed: I have personally reviewed following labs and imaging studies  CBC: Recent Labs  Lab 03/05/17 1143 03/06/17 0425 03/07/17 0817  WBC 9.5 7.8 8.3  NEUTROABS 8.5*  --   --   HGB 11.4* 10.4* 10.8*    HCT 35.2* 32.7* 33.5*  MCV 81.7 81.1 81.3  PLT 161 190 008   Basic Metabolic Panel: Recent Labs  Lab 03/05/17 1143 03/06/17 0425  NA 138 141  K 3.8 3.8  CL 100* 107  CO2 25 25  GLUCOSE 189* 133*  BUN 28* 25*  CREATININE 0.97 0.87  CALCIUM 8.8* 8.9  MG  --  1.7   GFR: Estimated Creatinine Clearance: 37.9 mL/min (by C-G formula based on SCr of 0.87 mg/dL). Liver Function Tests: Recent Labs  Lab 03/05/17 1143 03/06/17 0425  AST 17 18  ALT 11* 12*  ALKPHOS 72 70  BILITOT 1.0 1.1  PROT 6.7 6.2*  ALBUMIN 3.4* 3.2*   No results for input(s): LIPASE, AMYLASE in the last 168 hours. No results for input(s): AMMONIA in the last 168 hours. Coagulation Profile: No results for input(s): INR, PROTIME in the last 168 hours. Cardiac Enzymes: No results for input(s): CKTOTAL, CKMB, CKMBINDEX, TROPONINI in the last 168 hours. BNP (last 3 results) No results for input(s): PROBNP in the last 8760 hours. HbA1C: No results for input(s): HGBA1C in the last 72 hours. CBG: No results for input(s): GLUCAP in the last 168 hours.  Lipid Profile: No results for input(s): CHOL, HDL, LDLCALC, TRIG, CHOLHDL, LDLDIRECT in the last 72 hours. Thyroid Function Tests: No results for input(s): TSH, T4TOTAL, FREET4, T3FREE, THYROIDAB in the last 72 hours. Anemia Panel: No results for input(s): VITAMINB12, FOLATE, FERRITIN, TIBC, IRON, RETICCTPCT in the last 72 hours. Sepsis Labs: Recent Labs  Lab 03/05/17 1148 03/05/17 1255  LATICACIDVEN 2.44* 1.78    Recent Results (from the past 240 hour(s))  Urine culture     Status: None   Collection Time: 03/05/17 12:10 PM  Result Value Ref Range Status   Specimen Description URINE, RANDOM  Final   Special Requests NONE  Final   Culture   Final    NO GROWTH Performed at [facility], [place] [address] [place], [place] [address]    Report Status 03/06/2017 FINAL  Final  MRSA PCR Screening     Status: None   Collection Time: 03/05/17  6:54 PM   Result Value Ref Range Status   MRSA by PCR NEGATIVE NEGATIVE Final    Comment:        The GeneXpert MRSA Assay (FDA approved for NASAL specimens only), is one component of a comprehensive MRSA colonization surveillance program. It is not intended to diagnose MRSA infection nor to guide or monitor treatment for MRSA infections.        Radiology Studies: Ct Angio Chest Pe W And/or Wo Contrast  Result Date: 03/05/2017 CLINICAL DATA:  Shortness of breath. Syncopal episode today. Clinical suspicion for pulmonary embolism. EXAM: CT ANGIOGRAPHY CHEST WITH CONTRAST TECHNIQUE: Multidetector CT imaging of the chest was performed using the standard protocol during bolus administration of intravenous contrast. Multiplanar CT image reconstructions and MIPs were obtained to evaluate the vascular anatomy. CONTRAST:  113mL ISOVUE-370 IOPAMIDOL (ISOVUE-370) INJECTION 76% COMPARISON:  02/01/2007 FINDINGS: Cardiovascular: Extensive pulmonary embolism is seen in both pulmonary arteries and all lobar branches. RV/LV ratio is 1.4, consistent with right heart strain. No evidence of saddle embolus. Mild cardiomegaly, without pericardial effusion. Aortic and coronary artery atherosclerosis. Mediastinum/Nodes: No evidence of lymphadenopathy or other soft tissue masses. Lungs/Pleura: Mild atelectasis seen in both lower lobes and right upper lobe. No evidence of pulmonary consolidation or pleural effusion. No masses identified. Upper abdomen: No acute findings. Musculoskeletal: No suspicious bone lesions identified. Review of the MIP images confirms the above findings. IMPRESSION: Extensive bilateral pulmonary embolism. CT evidence of right heart strain (RV/LV Ratio = 1.4) consistent with at least submassive (intermediate risk) PE. The presence of right heart strain has been associated with an increased risk of morbidity and mortality. Please activate Code PE by paging [phone]. Critical Value/emergent results were  called by telephone at the time of interpretation on 03/05/2017 at 4:16 pm to Dr. Gareth Morgan , who verbally acknowledged these results. Electronically Signed   By: Earle Gell M.D.   On: 03/05/2017 16:18    Scheduled Meds: . chlorhexidine  15 mL Mouth Rinse BID  . cholecalciferol  5,000 Units Oral Daily  . citalopram  10 mg Oral Daily  . diltiazem  180 mg Oral Daily  . docusate sodium  100 mg Oral Daily  . levothyroxine  37.5 mcg Oral QAC breakfast  . mouth rinse  15 mL Mouth Rinse q12n4p  . memantine  28 mg Oral Daily  . montelukast  10 mg Oral QHS   Continuous Infusions: . sodium chloride 50 mL/hr at 03/06/17 1805  . heparin 850 Units/hr (03/06/17 2339)     LOS: 2 days  Time spent: [place], MD [facility] Pager [phone]  If 7PM-7AM, please contact night-coverage [email or web address] Password [email or web address] 03/07/2017, 12:17 PM

## 2017-03-08 DIAGNOSIS — F0280 Dementia in other diseases classified elsewhere without behavioral disturbance: Secondary | ICD-10-CM

## 2017-03-08 DIAGNOSIS — I10 Essential (primary) hypertension: Secondary | ICD-10-CM

## 2017-03-08 DIAGNOSIS — G309 Alzheimer's disease, unspecified: Secondary | ICD-10-CM

## 2017-03-08 DIAGNOSIS — N183 Chronic kidney disease, stage 3 (moderate): Secondary | ICD-10-CM

## 2017-03-08 DIAGNOSIS — I2609 Other pulmonary embolism with acute cor pulmonale: Secondary | ICD-10-CM

## 2017-03-08 LAB — CBC
HCT: 32.3 % — ABNORMAL LOW (ref 36.0–46.0)
HEMOGLOBIN: 10.6 g/dL — AB (ref 12.0–15.0)
MCH: 26.3 pg (ref 26.0–34.0)
MCHC: 32.8 g/dL (ref 30.0–36.0)
MCV: 80.1 fL (ref 78.0–100.0)
PLATELETS: 186 10*3/uL (ref 150–400)
RBC: 4.03 MIL/uL (ref 3.87–5.11)
RDW: 14.5 % (ref 11.5–15.5)
WBC: 9.5 10*3/uL (ref 4.0–10.5)

## 2017-03-08 LAB — HEPARIN LEVEL (UNFRACTIONATED): HEPARIN UNFRACTIONATED: 0.31 [IU]/mL (ref 0.30–0.70)

## 2017-03-08 MED ORDER — APIXABAN 5 MG PO TABS: 5.0000 mg | ORAL_TABLET | Freq: Two times a day (BID) | ORAL | Status: DC

## 2017-03-08 MED ORDER — APIXABAN 5 MG PO TABS
10.0000 mg | ORAL_TABLET | Freq: Two times a day (BID) | ORAL | Status: DC
  Administered 2017-03-08 – 2017-03-09 (×4): 10 mg via ORAL
  Filled 2017-03-08 (×4): qty 2

## 2017-03-08 NOTE — Progress Notes (Signed)
[facility] PROGRESS NOTE    Progress Note  [name]  VPX:[id number] DOB: [birth year] DOA: 03/05/2017 PCP: Sande Brothers, MD     Brief Narrative:   [name] is an 83 y.o. female past medical history of Alzheimer's dementia, atrial fibrillation not on anticoagulation due to fall, coronary artery disease hypothyroidism comes to the ED for syncope here she was found hypoxic with A. fib and RVR CT scan of the chest showed extensive pulmonary embolism with right heart strain  Assessment/Plan:   Syncope likely due to his acute submassive PE with acute right ventricular heart strain/acute hypoxic respiratory failure/right lower extremity DVT: Echocardiogram showed mild dilation of the right ventricle with a pulmonary pressure of 40 mmHg. Continue IV heparin, will start Eliquis at this point.  It was discussed with the daughter the risk and benefits of being on oral anticoagulation (Eliquis or Coumadin) PT eval is pending.  A. fib with RVR: Continue home dose of Cardizem heart rate has improved.  History of coronary artery disease: Continue current regimen, she had a drug-eluting stent more than a year ago. Patient will probably be on oral anticoagulation on discharge, we could DC the Plavix and start her on aspirin 81 mg.  Hypothyroidism: Continue Synthroid.  Advanced dementia/Alzheimer disease Seems to be at baseline.  Essential Hypertension: Pressure seems to be stable continue current regimen.  Chronic kidney disease, stage III (moderate) (HCC) Creatinine at baseline.   DVT prophylaxis: HEPARIN Family Communication:dAUGHTER Disposition Plan/Barrier to D/C: Home in am Code Status:     Code Status Orders  (From admission, onward)        Start     Ordered   03/05/17 1812  Do not attempt resuscitation (DNR)  Continuous    Question Answer Comment  In the event of cardiac or respiratory ARREST Do not call a "code blue"   In the event of cardiac or  respiratory ARREST Do not perform Intubation, CPR, defibrillation or ACLS   In the event of cardiac or respiratory ARREST Use medication by any route, position, wound care, and other measures to relive pain and suffering. May use oxygen, suction and manual treatment of airway obstruction as needed for comfort.      03/05/17 1811    Code Status History    Date Active Date Inactive Code Status Order ID Comments User Context   05/30/2016 17:25 05/31/2016 18:25 DNR [id number]  Debbe Odea, MD ED   05/30/2016 17:23 05/30/2016 17:25 DNR [id number]  Debbe Odea, MD ED   01/09/2015 15:37 01/11/2015 17:45 Full Code [id number]  Vickii Chafe, MD Inpatient   03/18/2011 22:21 03/21/2011 19:35 Full Code [id number]  Elease Hashimoto, RN ED    Advance Directive Documentation     Most Recent Value  Type of Advance Directive  Healthcare Power of Attorney, Living will, Out of facility DNR (pink MOST or yellow form)  Pre-existing out of facility DNR order (yellow form or pink MOST form)  Physician notified to receive inpatient order, Yellow form placed in chart (order not valid for inpatient use)  "MOST" Form in Place?  No data        IV Access:    Peripheral IV   Procedures and diagnostic studies:   No results found.   Medical Consultants:    None.  Anti-Infectives:   None  Subjective:    [name] bed comfortably no complaints extremely confused which probably is her baseline due to her dementia.  Objective:  Vitals:   03/07/17 1056 03/07/17 1326 03/07/17 2038 03/08/17 0504  BP: (!) 135/94 130/89 (!) 150/81 136/88  Pulse:  (!) 111 91 98  Resp:  18 18 18   Temp:  98.7 F (37.1 C) 98.3 F (36.8 C) 99.2 F (37.3 C)  TempSrc:  Oral Oral Oral  SpO2:  98% 96% 96%  Weight:      Height:        Intake/Output Summary (Last 24 hours) at 03/08/2017 0802 Last data filed at 03/08/2017 0600 Gross per 24 hour  Intake 2015.08 ml  Output 400 ml  Net 1615.08 ml   Filed  Weights   03/05/17 1730 03/05/17 1900  Weight: 62.1 kg (137 lb) 64.6 kg (142 lb 6.7 oz)    Exam: General exam: In no acute distress. Respiratory system: Good air movement and clear to auscultation. Cardiovascular system: S1 & S2 heard, RRR.  Gastrointestinal system: Abdomen is nondistended, soft and nontender.  Extremities: No pedal edema. Skin: No rashes, lesions or ulcers Psychiatry: Judgement and insight appear normal. Mood & affect appropriate.    Data Reviewed:    Labs: Basic Metabolic Panel: Recent Labs  Lab 03/05/17 1143 03/06/17 0425  NA 138 141  K 3.8 3.8  CL 100* 107  CO2 25 25  GLUCOSE 189* 133*  BUN 28* 25*  CREATININE 0.97 0.87  CALCIUM 8.8* 8.9  MG  --  1.7   GFR Estimated Creatinine Clearance: 37.9 mL/min (by C-G formula based on SCr of 0.87 mg/dL). Liver Function Tests: Recent Labs  Lab 03/05/17 1143 03/06/17 0425  AST 17 18  ALT 11* 12*  ALKPHOS 72 70  BILITOT 1.0 1.1  PROT 6.7 6.2*  ALBUMIN 3.4* 3.2*   No results for input(s): LIPASE, AMYLASE in the last 168 hours. No results for input(s): AMMONIA in the last 168 hours. Coagulation profile Recent Labs  Lab 03/07/17 1746  INR 1.21    CBC: Recent Labs  Lab 03/05/17 1143 03/06/17 0425 03/07/17 0817 03/08/17 0442  WBC 9.5 7.8 8.3 9.5  NEUTROABS 8.5*  --   --   --   HGB 11.4* 10.4* 10.8* 10.6*  HCT 35.2* 32.7* 33.5* 32.3*  MCV 81.7 81.1 81.3 80.1  PLT 161 190 190 186   Cardiac Enzymes: No results for input(s): CKTOTAL, CKMB, CKMBINDEX, TROPONINI in the last 168 hours. BNP (last 3 results) No results for input(s): PROBNP in the last 8760 hours. CBG: No results for input(s): GLUCAP in the last 168 hours. D-Dimer: No results for input(s): DDIMER in the last 72 hours. Hgb A1c: No results for input(s): HGBA1C in the last 72 hours. Lipid Profile: No results for input(s): CHOL, HDL, LDLCALC, TRIG, CHOLHDL, LDLDIRECT in the last 72 hours. Thyroid function studies: No results for  input(s): TSH, T4TOTAL, T3FREE, THYROIDAB in the last 72 hours.  Invalid input(s): FREET3 Anemia work up: No results for input(s): VITAMINB12, FOLATE, FERRITIN, TIBC, IRON, RETICCTPCT in the last 72 hours. Sepsis Labs: Recent Labs  Lab 03/05/17 1143 03/05/17 1148 03/05/17 1255 03/06/17 0425 03/07/17 0817 03/08/17 0442  WBC 9.5  --   --  7.8 8.3 9.5  LATICACIDVEN  --  2.44* 1.78  --   --   --    Microbiology Recent Results (from the past 240 hour(s))  Urine culture     Status: None   Collection Time: 03/05/17 12:10 PM  Result Value Ref Range Status   Specimen Description URINE, RANDOM  Final   Special Requests NONE  Final  Culture   Final    NO GROWTH Performed at [facility], [place] [address] [place], [place] [address]    Report Status 03/06/2017 FINAL  Final  MRSA PCR Screening     Status: None   Collection Time: 03/05/17  6:54 PM  Result Value Ref Range Status   MRSA by PCR NEGATIVE NEGATIVE Final    Comment:        The GeneXpert MRSA Assay (FDA approved for NASAL specimens only), is one component of a comprehensive MRSA colonization surveillance program. It is not intended to diagnose MRSA infection nor to guide or monitor treatment for MRSA infections.      Medications:   . chlorhexidine  15 mL Mouth Rinse BID  . cholecalciferol  5,000 Units Oral Daily  . citalopram  10 mg Oral Daily  . diltiazem  180 mg Oral Daily  . docusate sodium  100 mg Oral Daily  . levothyroxine  37.5 mcg Oral QAC breakfast  . mouth rinse  15 mL Mouth Rinse q12n4p  . memantine  28 mg Oral Daily  . montelukast  10 mg Oral QHS   Continuous Infusions: . sodium chloride 50 mL/hr at 03/07/17 1435  . heparin 1,050 Units/hr (03/08/17 4580)      LOS: 3 days   Charlynne Cousins  [facility] Pager [phone]  *Please refer to [email or web address], password [email or web address] to get updated schedule on who will round on this patient, as hospitalists switch teams weekly. If 7PM-7AM, please  contact night-coverage at [email or web address], password [email or web address] for any overnight needs.  03/08/2017, 8:02 AM

## 2017-03-08 NOTE — Care Management Important Message (Signed)
Important Message  Patient Details  Name: LANDIS CASSARO MRN: [id number] Date of Birth: 04-27-1925   Medicare Important Message Given:  Yes    Kerin Salen 03/08/2017, 10:27 AMImportant Message  Patient Details  Name: SHAMIRAH IVAN MRN: [id number] Date of Birth: 01-Feb-1926   Medicare Important Message Given:  Yes    Kerin Salen 03/08/2017, 10:27 AM

## 2017-03-08 NOTE — Progress Notes (Signed)
ANTICOAGULATION CONSULT NOTE - Follow Up Consult  Pharmacy Consult for Heparin Indication: atrial fibrillation, pulmonary embolus and DVT    Allergies  Allergen Reactions  . Codeine Nausea And Vomiting    Pt doesn't remember this reaction   . Tuberculin Tests     Unknown-MAR    Patient Measurements: Height: 5\' 4"  (162.6 cm) Weight: 142 lb 6.7 oz (64.6 kg) IBW/kg (Calculated) : 54.7 Heparin Dosing Weight:   Vital Signs: Temp: 99.2 F (37.3 C) (12/04 0504) Temp Source: Oral (12/04 0504) BP: 136/88 (12/04 0504) Pulse Rate: 98 (12/04 0504)  Labs: Recent Labs    03/05/17 1143 03/06/17 0425  03/07/17 0817 03/07/17 1746 03/08/17 0442  HGB 11.4* 10.4*  --  10.8*  --  10.6*  HCT 35.2* 32.7*  --  33.5*  --  32.3*  PLT 161 190  --  190  --  186  LABPROT  --   --   --   --  15.2  --   INR  --   --   --   --  1.21  --   HEPARINUNFRC  --  0.48   < > 0.32 0.24* 0.31  CREATININE 0.97 0.87  --   --   --   --    < > = values in this interval not displayed.    Estimated Creatinine Clearance: 37.9 mL/min (by C-G formula based on SCr of 0.87 mg/dL).   Medications:  Infusions:  . sodium chloride 50 mL/hr at 03/07/17 1435  . heparin 1,050 Units/hr (03/08/17 8676)    Assessment: Patient with heparin level at goal.  No heparin issues per RN.  Goal of Therapy:  Heparin level 0.3-0.7 units/ml Monitor platelets by anticoagulation protocol: Yes   Plan:  Increase heparin to 1050 units/hr Recheck level at [address] [place] 03/08/2017,6:04 AM

## 2017-03-08 NOTE — Progress Notes (Signed)
ANTICOAGULATION CONSULT NOTE - f/u Consult  Pharmacy Consult for heparin Indication: atrial fibrillation, pulmonary embolus and DVT  Allergies  Allergen Reactions  . Codeine Nausea And Vomiting    Pt doesn't remember this reaction   . Tuberculin Tests     Unknown-MAR    Patient Measurements: Height: 5\' 4"  (162.6 cm) Weight: 142 lb 6.7 oz (64.6 kg) IBW/kg (Calculated) : 54.7 Heparin Dosing Weight: 62.1 kg  Vital Signs: Temp: 99.2 F (37.3 C) (12/04 0504) Temp Source: Oral (12/04 0504) BP: 136/88 (12/04 0504) Pulse Rate: 98 (12/04 0504)  Labs: Recent Labs    03/05/17 1143 03/06/17 0425  03/07/17 0817 03/07/17 1746 03/08/17 0442  HGB 11.4* 10.4*  --  10.8*  --  10.6*  HCT 35.2* 32.7*  --  33.5*  --  32.3*  PLT 161 190  --  190  --  186  LABPROT  --   --   --   --  15.2  --   INR  --   --   --   --  1.21  --   HEPARINUNFRC  --  0.48   < > 0.32 0.24* 0.31  CREATININE 0.97 0.87  --   --   --   --    < > = values in this interval not displayed.    Estimated Creatinine Clearance: 37.9 mL/min (by C-G formula based on SCr of 0.87 mg/dL).  Medications:  . sodium chloride 50 mL/hr at 03/07/17 1435  . heparin 1,050 Units/hr (03/08/17 1308)    Assessment: 83 yo F with new PE.  CT 12/1: extensive B PE with R heart strain. PMH of afib not on anticoagulation.  Bilateral lower extremity venous duplex showed evidence of age indeterminate deep vein thrombosis involving the distal femoral vein of the right lower extremity per vascular lab notes.  Pharmacy is consulted to transition from Heparin to Eliquis.  Today, 03/08/2017: - Heparin level 0.31, therapeutic on heparin at 1050 units/hr - CBC: Hgb 10.6, Plt WNL, stable - No bleeding or complications reported.  No RBC in UA on 12/1. - SCr 0.87, CrCl ~ 38 ml/min - Weight 64.6 kg  Goal of Therapy:  Monitor platelets by anticoagulation protocol: Yes    Plan:   Stop heparin IV infusion  Eliquis 10mg  PO BID x7 days,  followed by 5mg  PO BID   Monitor for bleeding, complications  Pharmacy to provide Eliquis education prior to discharge   Gretta Arab PharmD, BCPS Pager [phone] 03/08/2017 8:35 AM

## 2017-03-08 NOTE — Plan of Care (Signed)
Pt not following commands to stay in bed. Pt was given a bed bath and that helped.

## 2017-03-08 NOTE — Evaluation (Signed)
Physical Therapy Evaluation Patient Details Name: [name] MRN: [id number] DOB: 03/15/1926 Today's Date: 03/08/2017   History of Present Illness  Patient is an 83 y/o female admitted due to syncope with submassive PE.  PMH positive for A-fib w/RVR, Alzheimer's dementia, CAD and hearing loss.  Clinical Impression  Patient presents with decreased independence and safety with mobility due to limited cardiopulmonary reserve, decreased balance, and general debility.  She will benefit from skilled PT in the acute setting to allow return to independent ambulation at facility.  Follow up PT at facility recommended as well.     Follow Up Recommendations SNF(back to memory care facility with follow up PT)    Equipment Recommendations  None recommended by PT    Recommendations for Other Services       Precautions / Restrictions Precautions Precautions: Fall Precaution Comments: watch SpO2      Mobility  Bed Mobility Overal bed mobility: Needs Assistance Bed Mobility: Supine to Sit     Supine to sit: HOB elevated;Min assist     General bed mobility comments: increased time to initiate; assist to scoot forward  Transfers Overall transfer level: Needs assistance Equipment used: Rolling walker (2 wheeled) Transfers: Sit to/from Stand Sit to Stand: Mod assist;+2 safety/equipment         General transfer comment: assist due to pt locking knees against bed and not standing erect, cues and assist for hip extension  Ambulation/Gait Ambulation/Gait assistance: Mod assist;+2 safety/equipment Ambulation Distance (Feet): 60 Feet Assistive device: Rolling walker (2 wheeled) Gait Pattern/deviations: Step-to pattern;Decreased stride length;Trunk flexed;Shuffle;Drifts right/left     General Gait Details: confused some with using walker, so assist to manage walker, also assist for balance due to flexed posture +2 for IV pole and safety  Stairs            Wheelchair Mobility     Modified Rankin (Stroke Patients Only)       Balance Overall balance assessment: Needs assistance   Sitting balance-Leahy Scale: Fair     Standing balance support: Bilateral upper extremity supported Standing balance-Leahy Scale: Poor Standing balance comment: UE support and assist for balance static due to legs braced against bed                              Pertinent Vitals/Pain Pain Assessment: Faces Faces Pain Scale: No hurt    Home Living Family/patient expects to be discharged to:: Skilled nursing facility(memory care unit at [facility])                      Prior Function Level of Independence: Needs assistance   Gait / Transfers Assistance Needed: was ambulatory without device, but slower speed with this episode so daughter reports has her using 4WW  ADL's / Homemaking Assistance Needed: assist for bath, takes herself to bathroom        Hand Dominance        Extremity/Trunk Assessment   Upper Extremity Assessment Upper Extremity Assessment: Defer to OT evaluation    Lower Extremity Assessment Lower Extremity Assessment: Generalized weakness    Cervical / Trunk Assessment Cervical / Trunk Assessment: Kyphotic  Communication   Communication: HOH  Cognition Arousal/Alertness: Awake/alert Behavior During Therapy: WFL for tasks assessed/performed Overall Cognitive Status: History of cognitive impairments - at baseline  General Comments General comments (skin integrity, edema, etc.): SpO2 94% on 3L O2 at rest, removed O2 for ambulation and pt SOB, but sats maintained at 90%; HR max 127, pt with a-fib    Exercises     Assessment/Plan    PT Assessment Patient needs continued PT services  PT Problem List Decreased strength;Decreased mobility;Decreased activity tolerance;Decreased balance;Decreased knowledge of use of DME;Cardiopulmonary status limiting activity        PT Treatment Interventions DME instruction;Therapeutic activities;Patient/family education;Therapeutic exercise;Gait training;Balance training;Functional mobility training    PT Goals (Current goals can be found in the Care Plan section)  Acute Rehab PT Goals Patient Stated Goal: To return to independent ambulation Time For Goal Achievement: 03/22/17 Potential to Achieve Goals: Good    Frequency Min 2X/week   Barriers to discharge        Co-evaluation PT/OT/SLP Co-Evaluation/Treatment: Yes Reason for Co-Treatment: For patient/therapist safety PT goals addressed during session: Mobility/safety with mobility;Proper use of DME         AM-PAC PT "6 Clicks" Daily Activity  Outcome Measure Difficulty turning over in bed (including adjusting bedclothes, sheets and blankets)?: None Difficulty moving from lying on back to sitting on the side of the bed? : Unable Difficulty sitting down on and standing up from a chair with arms (e.g., wheelchair, bedside commode, etc,.)?: Unable Help needed moving to and from a bed to chair (including a wheelchair)?: A Lot Help needed walking in hospital room?: A Lot Help needed climbing 3-5 steps with a railing? : A Lot 6 Click Score: 12    End of Session Equipment Utilized During Treatment: Gait belt;Oxygen Activity Tolerance: Patient tolerated treatment well Patient left: with chair alarm set;with family/visitor present;in chair   PT Visit Diagnosis: Other abnormalities of gait and mobility (R26.89);Muscle weakness (generalized) (M62.81)    Time: 6440-3474 PT Time Calculation (min) (ACUTE ONLY): 24 min   Charges:   PT Evaluation $PT Eval Moderate Complexity: 1 Mod     PT G CodesMagda Kiel, [place] [phone] 03/08/2017   Reginia Naas 03/08/2017, 12:34 PM

## 2017-03-08 NOTE — Evaluation (Signed)
Occupational Therapy Evaluation Patient Details Name: [name] MRN: [id number] DOB: June 16, 1925 Today's Date: 03/08/2017    History of Present Illness Patient is an 83 y/o female admitted due to syncope with submassive PE.  PMH positive for A-fib w/RVR, Alzheimer's dementia, CAD and hearing loss.   Clinical Impression    Pt admitted with syncope. . Pt currently with functional limitations due to the deficits listed below (see OT Problem List).  Pt will benefit from skilled OT to increase their safety and independence with ADL and functional mobility for ADL to facilitate discharge to venue listed below.    Follow Up Recommendations  Other (comment)(back to memory care unit at [facility] )          Precautions / Restrictions Precautions Precautions: Fall Precaution Comments: watch SpO2      Mobility Bed Mobility Overal bed mobility: Needs Assistance Bed Mobility: Supine to Sit     Supine to sit: HOB elevated;Min assist     General bed mobility comments: increased time to initiate; assist to scoot forward  Transfers Overall transfer level: Needs assistance Equipment used: Rolling walker (2 wheeled) Transfers: Sit to/from Stand Sit to Stand: Mod assist;+2 safety/equipment         General transfer comment: assist due to pt locking knees against bed and not standing erect, cues and assist for hip extension    Balance Overall balance assessment: Needs assistance   Sitting balance-Leahy Scale: Fair     Standing balance support: Bilateral upper extremity supported Standing balance-Leahy Scale: Poor Standing balance comment: UE support and assist for balance static due to legs braced against bed                            ADL either performed or assessed with clinical judgement   ADL Overall ADL's : Needs assistance/impaired Eating/Feeding: Minimal assistance;Sitting   Grooming: Minimal assistance;Sitting   Upper Body Bathing: Minimal  assistance;Sitting   Lower Body Bathing: Maximal assistance;Sit to/from stand;Cueing for safety   Upper Body Dressing : Minimal assistance;Sitting   Lower Body Dressing: Maximal assistance;Sit to/from stand;Cueing for sequencing;Cueing for safety   Toilet Transfer: Moderate assistance;Comfort height toilet   Toileting- Clothing Manipulation and Hygiene: Maximal assistance;Sit to/from stand         General ADL Comments: pt lives at memory care and has A with ADL activity      Vision Patient Visual Report: No change from baseline              Pertinent Vitals/Pain Pain Assessment: Faces Faces Pain Scale: No hurt     Hand Dominance     Extremity/Trunk Assessment Upper Extremity Assessment Upper Extremity Assessment: Generalized weakness   Lower Extremity Assessment Lower Extremity Assessment: Generalized weakness   Cervical / Trunk Assessment Cervical / Trunk Assessment: Kyphotic   Communication Communication Communication: HOH   Cognition Arousal/Alertness: Awake/alert Behavior During Therapy: WFL for tasks assessed/performed Overall Cognitive Status: History of cognitive impairments - at baseline                                     General Comments  SpO2 94% on 3L O2 at rest, removed O2 for ambulation and pt SOB, but sats maintained at 90%; HR max 127, pt with a-fib            Home Living Family/patient expects to be discharged to:: Skilled  nursing facility(memory care unit at [facility])                                        Prior Functioning/Environment Level of Independence: Needs assistance  Gait / Transfers Assistance Needed: was ambulatory without device, but slower speed with this episode so daughter reports has her using 4WW ADL's / Homemaking Assistance Needed: assist for bath, takes herself to bathroom            OT Problem List: Decreased strength;Impaired balance (sitting and/or standing);Decreased  safety awareness;Decreased knowledge of use of DME or AE      OT Treatment/Interventions: Self-care/ADL training;Patient/family education;DME and/or AE instruction    OT Goals(Current goals can be found in the care plan section) Acute Rehab OT Goals Patient Stated Goal: To return to independent ambulation  OT Frequency: Min 2X/week   Barriers to D/C:            Co-evaluation   Reason for Co-Treatment: For patient/therapist safety PT goals addressed during session: Mobility/safety with mobility;Proper use of DME        AM-PAC PT "6 Clicks" Daily Activity     Outcome Measure Help from another person eating meals?: A Little Help from another person taking care of personal grooming?: A Little Help from another person toileting, which includes using toliet, bedpan, or urinal?: A Lot Help from another person bathing (including washing, rinsing, drying)?: A Lot Help from another person to put on and taking off regular upper body clothing?: A Little Help from another person to put on and taking off regular lower body clothing?: A Lot 6 Click Score: 15   End of Session Equipment Utilized During Treatment: Rolling walker Nurse Communication: Mobility status  Activity Tolerance: Patient tolerated treatment well Patient left: in chair;with call bell/phone within reach;with chair alarm set;with family/visitor present  OT Visit Diagnosis: Unsteadiness on feet (R26.81);History of falling (Z91.81);Cognitive communication deficit (R41.841)                Time: 8416-6063 OT Time Calculation (min): 17 min Charges:  OT General Charges $OT Visit: 1 Visit OT Evaluation $OT Eval Moderate Complexity: 1 Mod G-Codes:     Kari Baars, [place]  Payton Mccallum D 03/08/2017, 12:42 PM

## 2017-03-09 DIAGNOSIS — G301 Alzheimer's disease with late onset: Secondary | ICD-10-CM

## 2017-03-09 DIAGNOSIS — I4891 Unspecified atrial fibrillation: Secondary | ICD-10-CM

## 2017-03-09 DIAGNOSIS — F0281 Dementia in other diseases classified elsewhere with behavioral disturbance: Secondary | ICD-10-CM

## 2017-03-09 DIAGNOSIS — I2510 Atherosclerotic heart disease of native coronary artery without angina pectoris: Secondary | ICD-10-CM

## 2017-03-09 DIAGNOSIS — I2601 Septic pulmonary embolism with acute cor pulmonale: Secondary | ICD-10-CM

## 2017-03-09 DIAGNOSIS — J9601 Acute respiratory failure with hypoxia: Secondary | ICD-10-CM

## 2017-03-09 LAB — CBC
HCT: 31.8 % — ABNORMAL LOW (ref 36.0–46.0)
Hemoglobin: 10.5 g/dL — ABNORMAL LOW (ref 12.0–15.0)
MCH: 26.5 pg (ref 26.0–34.0)
MCHC: 33 g/dL (ref 30.0–36.0)
MCV: 80.3 fL (ref 78.0–100.0)
PLATELETS: 191 10*3/uL (ref 150–400)
RBC: 3.96 MIL/uL (ref 3.87–5.11)
RDW: 14.6 % (ref 11.5–15.5)
WBC: 7.3 10*3/uL (ref 4.0–10.5)

## 2017-03-09 MED ORDER — PANTOPRAZOLE SODIUM 40 MG PO TBEC: 40.0000 mg | DELAYED_RELEASE_TABLET | Freq: Every day | ORAL | 0 refills | Status: AC

## 2017-03-09 MED ORDER — APIXABAN 5 MG PO TABS: 10.0000 mg | ORAL_TABLET | Freq: Two times a day (BID) | ORAL | 0 refills | Status: AC

## 2017-03-09 MED ORDER — APIXABAN 5 MG PO TABS: 5.0000 mg | ORAL_TABLET | Freq: Two times a day (BID) | ORAL | 0 refills | Status: AC

## 2017-03-09 NOTE — [place] FL2 (Signed)
[place] MEDICAID FL2 LEVEL OF CARE SCREENING TOOL     IDENTIFICATION  Patient Name: [name] Birthdate: 09/08/1925 Sex: female Admission Date (Current Location): 03/05/2017  [facility] and [place] Number:  Herbalist and Address:  [facility],  [place] [address], [place]      Provider Number: [id number]  Attending Physician Name and Address:  Jonetta Osgood, MD  Relative Name and Phone Number:       Current Level of Care: Hospital Recommended Level of Care: [place], Memory Care Prior Approval Number:    Date Approved/Denied:   PASRR Number:    Discharge Plan: Other (Comment)(ALF (memory care unit))    Current Diagnoses: Patient Active Problem List   Diagnosis Date Noted  . Pulmonary emboli ([place]) 03/05/2017  . Hyperkalemia 05/30/2016  . AKI (acute kidney injury) ([place]) 01/10/2015  . Atrial fibrillation ([place]) 01/09/2015  . Pain in the chest   . Fall 03/21/2011  . Hypokalemia 03/18/2011  . CAD (coronary artery disease) 03/18/2011  . Stented coronary artery 03/18/2011  . Atrial fibrillation with RVR ([place])   . Alzheimer disease   . Anxiety   . Hypertension   . COPD (chronic obstructive pulmonary disease) ([place])   . Chronic kidney disease, stage III (moderate) (HCC)   . GERD (gastroesophageal reflux disease)   . Dyslipidemia   . Hypothyroid     Orientation RESPIRATION BLADDER Height & Weight     Self  O2 Incontinent Weight: 142 lb 6.7 oz (64.6 kg) Height:  5\' 4"  (162.6 cm)  BEHAVIORAL SYMPTOMS/MOOD NEUROLOGICAL BOWEL NUTRITION STATUS      Incontinent Diet(No added table salt)  AMBULATORY STATUS COMMUNICATION OF NEEDS Skin   Extensive Assist Verbally Normal                       Personal Care Assistance Level of Assistance  Bathing, Feeding, Dressing Bathing Assistance: Maximum assistance Feeding assistance: Independent Dressing Assistance: Maximum assistance     Functional Limitations Info   Sight, Hearing, Speech Sight Info: Adequate Hearing Info: Impaired Speech Info: Adequate    SPECIAL CARE FACTORS FREQUENCY  PT (By licensed PT)     PT Frequency: HHPT              Contractures Contractures Info: Not present    Additional Factors Info  Code Status, Allergies Code Status Info: DNR Allergies Info: Codeine;Tuberculin Tests              Discharge Medications:  Medication List             STOP taking these medications            clopidogrel 75 MG tablet Commonly known as:  PLAVIX                       TAKE these medications            acetaminophen 500 MG tablet Commonly known as:  TYLENOL Take 1,000 mg by mouth every 6 (six) hours as needed for mild pain.    alum & mag hydroxide-simeth 200-200-20 MG/5ML suspension Commonly known as:  MAALOX/MYLANTA Take 30 mLs by mouth every 6 (six) hours as needed for indigestion or heartburn.    apixaban 5 MG Tabs tablet Commonly known as:  ELIQUIS Take 2 tablets (10 mg total) by mouth 2 (two) times daily. 7 days from 03/08/17 and then switch to 5 mg by mouth twice  a day from 12/11    apixaban 5 MG Tabs tablet Commonly known as:  ELIQUIS Take 1 tablet (5 mg total) by mouth 2 (two) times daily. First dose on 03/15/17 at 10 AM. Start taking on:  03/15/2017    citalopram 10 MG tablet Commonly known as:  CELEXA Take 10 mg by mouth daily.    diltiazem 180 MG 24 hr capsule Commonly known as:  DILACOR XR Take 1 capsule (180 mg total) by mouth daily.    docusate sodium 100 MG capsule Commonly known as:  COLACE Take 100 mg by mouth daily.    guaifenesin 100 MG/5ML syrup Commonly known as:  ROBITUSSIN Take 200 mg by mouth every 6 (six) hours as needed for cough.    hydrocortisone cream 1 % Apply 1 application topically 2 (two) times daily as needed for itching.    levothyroxine 75 MCG tablet Commonly known as:  SYNTHROID, LEVOTHROID Take 37.5 mcg by mouth daily before breakfast.     loperamide 2 MG capsule Commonly known as:  IMODIUM Take 2 mg by mouth as needed for diarrhea or loose stools.    magnesium hydroxide 400 MG/5ML suspension Commonly known as:  MILK OF MAGNESIA Take 30 mLs by mouth at bedtime as needed for mild constipation.    montelukast 10 MG tablet Commonly known as:  SINGULAIR Take 10 mg by mouth at bedtime.    NAMENDA XR 28 MG Cp24 24 hr capsule Generic drug:  memantine Take 28 mg by mouth daily.    neomycin-bacitracin-polymyxin ointment Commonly known as:  NEOSPORIN Apply 1 application topically every 12 (twelve) hours. As needed for skin tears    nitroGLYCERIN 0.4 MG SL tablet Commonly known as:  NITROSTAT Place 0.4 mg under the tongue every 5 (five) minutes as needed for chest pain.    pantoprazole 40 MG tablet Commonly known as:  PROTONIX Take 1 tablet (40 mg total) by mouth daily.    PRESCRIPTION MEDICATION Take 1 Scoop by mouth daily.    Vitamin D-3 5000 units Tabs Take 1 tablet by mouth daily.        Relevant Imaging Results:  Relevant Lab Results:   Additional Information SSN   [id number]  Burnis Medin, LCSW

## 2017-03-09 NOTE — Progress Notes (Signed)
Patient returning to [facility]. Facility aware of patient's discharge and confirmed patient's ability to return. Patient cannot return to ALF until O2 is delivered. Staff member Santiago Glad agreed to call hospital once O2 is delivered so patient's transportation can be set up. CSW provided staff with [place] secretary number to call when O2 is delivered. Patient's RN informed that patient's transportation will need to be set up once patient's O2 is delivered, patient's RN agreed to set up transportation.   Packet complete, Patient's RN can call report to [phone].  Patient's RN will set up transportation for patient. CSW signing off, no other needs identified.   Abundio Miu, [place] Social Worker [facility] Cell#: [phone]

## 2017-03-09 NOTE — Care Management Note (Signed)
Case Management Note  Patient Details  Name: LINZEE DEPAUL MRN: [id number] Date of Birth: [birth year]  Subjective/Objective: 83 y/o f admitted w/PE. From ALF-CSW following. PT/OT-return back to ALF. Noted on 02-If home 02 needed can arrange w/qualifying 02 sats documented, & home 02 order.                   Action/Plan:d/c back to ALF   Expected Discharge Date:  03/08/17               Expected Discharge Plan:  Assisted Living / Rest Home  In-House Referral:  Clinical Social Work  Discharge planning Services  CM Consult  Post Acute Care Choice:    Choice offered to:     DME Arranged:    DME Agency:     HH Arranged:    HH Agency:     Status of Service:  In process, will continue to follow  If discussed at Long Length of Stay Meetings, dates discussed:    Additional Comments:  Dessa Phi, RN 03/09/2017, 9:40 AM

## 2017-03-09 NOTE — Progress Notes (Signed)
Follow up call received from advanced home care representative whom confirmed oxygen had been delivered to facility earlier in the day. transport contacted [facility] for transport, facility updated

## 2017-03-09 NOTE — Progress Notes (Addendum)
SATURATION QUALIFICATIONS: Patient Saturations on Room Air at Rest =  88 %  Patient Saturations on Room Air while Ambulating/ [facility] = 88 %  Patient Saturations on 2 Liters of oxygen while rest  = 94-96 %  Please briefly explain why patient needs home oxygen: Patient was assisted out of bed to beside commode. Pt appears unable to mobilize self well. While resting in bed O2 saturation 89%. After moving to bedside commode sat decreased 88%. Pt placed on 1 lt O2 94%.   Pt was reevaluated, RN unable to get patient out of bed. Sat decreased 88% on room air.

## 2017-03-09 NOTE — Clinical Social Work Note (Signed)
Clinical Social Work Assessment  Patient Details  Name: [name] MRN: [id number] Date of Birth: 10/25/1925  Date of referral:  03/09/17               Reason for consult:  Facility Placement                Permission sought to share information with:    Permission granted to share information::     Name::        Agency::     Relationship::     Contact Information:     Housing/Transportation Living arrangements for the past 2 months:  Assisted Living Facility([facility]) Source of Information:  Adult Children Patient Interpreter Needed:  None Criminal Activity/Legal Involvement Pertinent to Current Situation/Hospitalization:  No - Comment as needed Significant Relationships:  Adult Children Lives with:  Facility Resident Do you feel safe going back to the place where you live?  Yes Need for family participation in patient care:  Yes (Comment)  Care giving concerns:  Patient from [facility] memory care. Patient's daughter verbalized plan for patient to return. PT recommended "SNF(back to memory care facility with follow up PT)". Patient from memory care ALF.   Social Worker assessment / plan:  CSW spoke with patient's daughter regarding discharge planning, patient's daughter verbalized plan for patient to return to [facility] ALF memory care. Patient's daughter reported that patient cannot return to ALF until O2 is delivered, CSW informed patient's daughter that RNCM will set up to have patient's O2 delivered to ALF.  CSW completed FL2 and sent clinical documents to ALF via [place].  CSW spoke with [facility] who reported that patient's O2 was ordered and would delivered to ALF.  CSW contacted ALF and spoke with staff member Santiago Glad who confirmed patient's ability to return and receipt of clinical documents. Staff agreed to contact hospital once patient's O2 is delivered so patient's transportation can be set up.  CSW completed patient's packet and informed patient's RN that  patient's transportation will need to be set up when ALF calls hospital and reports that patient's O2 had been delivered.    Employment status:  Retired Forensic scientist:  Medicare PT Recommendations:  [place] / Referral to community resources:     Patient/Family's Response to care:  Patient's daughter agreeable to patient returning to current ALF memory care unit.  Patient/Family's Understanding of and Emotional Response to Diagnosis, Current Treatment, and Prognosis:  Patient's daughter involved in patient's care and verbalized plan for patient to dc back to current ALF.  Emotional Assessment Appearance:  Appears stated age Attitude/Demeanor/Rapport:  Unable to Assess Affect (typically observed):  Unable to Assess Orientation:  Oriented to Self Alcohol / Substance use:  Not Applicable Psych involvement (Current and /or in the community):  No (Comment)  Discharge Needs  Concerns to be addressed:  Care Coordination Readmission within the last 30 days:  No Current discharge risk:  None Barriers to Discharge:  Equipment Delay   Burnis Medin, LCSW 03/09/2017, 3:38 PM

## 2017-03-09 NOTE — Care Management Note (Signed)
Case Management Note  Patient Details  Name: [name] MRN: [id number] Date of Birth: 16-Sep-1925  Subjective/Objective: [facility] already following for home 02 w/qualifying [id number], already have home 02 order. Awaiting 02 sats. [facility] will deliver home 02 tanks to [place] home 02 there-Facility will call hospital floor for patient to transport by [facility]-CSW will arranage.HHPT ordered-facility uses [facility] for PT-faxed w/confirmation HHPT order.                  Action/Plan:d/c plan ALF w/HHC   Expected Discharge Date:  03/09/17               Expected Discharge Plan:  Assisted Living / Rest Home  In-House Referral:  Clinical Social Work  Discharge planning Services  CM Consult  Post Acute Care Choice:    Choice offered to:     DME Arranged:  Oxygen DME Agency:     HH Arranged:  PT HH Agency:  Other - See comment([facility]-ALF-contracts w/[facility] for PT)  Status of Service:  Completed, signed off  If discussed at [place] of Stay Meetings, dates discussed:    Additional Comments:  Dessa Phi, RN 03/09/2017, 10:10 AM

## 2017-03-09 NOTE — Discharge Summary (Addendum)
PATIENT DETAILS Name: [name] Age: 83 y.o. Sex: female Date of Birth: 02-Jul-1925 MRN: [id number]. Admitting Physician: Florencia Reasons, MD HDQ:QIWLN, Mikeal Hawthorne, MD  Admit Date: 03/05/2017 Discharge date: 03/09/2017  Recommendations for Outpatient Follow-up:  1. Follow up with PCP in 1-2 weeks 2. Would aim for at least 6 months of anticoagulation 3. Please obtain BMP/CBC in one week  Admitted From:  ALF   Disposition: ALF    Home Health:  Yes  Equipment/Devices: oxygen 2L  Discharge Condition: Guarded  CODE STATUS: DNR  Diet recommendation:  Heart Healthy   Brief Summary: See H&P, Labs, Consult and Test reports for all details in brief, patient is a 83 year old female with history of dementia, A. fib not on anticoagulation prior to this hospitalization who resented to the hospital for evaluation of a syncopal episode, she was found to be hypoxic and tachycardic-CT angiogram of the chest showed extensive bilateral pulmonary embolism. She was subsequently admitted to the hospitalist service, started on IV heparin, after discussion with family-she has been now transitioned to oral anticoagulation with atelectasis. Please see below for further details   Brief Hospital Course: Acute hypoxic respiratory failure due to submassive pulmonary embolism: Started on IV heparin-after extensive discussion with family by prior hospitalist M.D., she has not been transitioned to full liquids. This M.D. spoke with patient's daughter [name] over the phone this morning, she is aware of risk of catastrophic falls causing life-threatening and life disabling issues. She understands the risks, but realizes at this time that patient is needing anticoagulation. Difficult situation, probably would aim for at least 6 months of anticoagulation if she tolerates, ideally probably would need indefinite anticoagulation. Patient will require short-term oxygen, suspect oxygen can be weaned off over the next few days  while at ALF.  Syncope: Likely secondary to above.  Atrial fibrillation with RVR: Rate better controlled-previously not on any anticoagulation (felt to be up poor candidate)-but given submassive pulmonary embolism-started on anticoagulation-see above. Continue rate control with metoprolol.  History of CAD: She does have a history of CAD-she has had higher PCI but those were a number of years ago-given the fact that she is now going to be on anticoagulation with liquids-we have discontinued all antiplatelet agents. Patient is incredibly frail-demented-and  bleeding risk will be exceedingly high if she is continued on both anti-platelets and anticoagulation.  Addendum-spoke with primary cardiologist-Dr. Janae Sauce with discontinuing anticoagulation-S patient will now be on anticoagulation   Hypothyroidism: Continue Synthroid  Dementia: Pleasantly confused-continue with Celexa and Namenda.  Chronic kidney disease: Creatinine stable and close to usual baseline-follow periodically.  Anemia: Appears to be chronic-further workup deferred to patient's PCP  Right leg DVT: Seen on Doppler ultrasound-see above regarding anticoagulation.  Procedures/Studies: Echo>> - Left ventricle: The cavity size was normal. Wall thickness was   increased in a pattern of moderate LVH. Systolic function was   normal. Wall motion was normal; there were no regional wall   motion abnormalities. - Ventricular septum: The contour showed mild systolic flattening.   These changes are consistent with RV pressure overload. - Mitral valve: There was mild regurgitation. - Left atrium: The atrium was severely dilated. - Right ventricle: The cavity size was dilated. Wall thickness was   normal. Systolic function was mildly to moderately reduced. - Right atrium: The atrium was dilated. - Pulmonary arteries: Systolic pressure was mildly to moderately   increased. PA peak pressure: 40 mm Hg (S). - Pericardium,  extracardiac: A trivial pericardial effusion was   identified.  Discharge Diagnoses:  Active Problems:   Atrial fibrillation with RVR (HCC)   Alzheimer disease   Anxiety   Hypertension   COPD (chronic obstructive pulmonary disease) (HCC)   Chronic kidney disease, stage III (moderate) (HCC)   GERD (gastroesophageal reflux disease)   Dyslipidemia   Hypothyroid   CAD (coronary artery disease)   Fall   Pulmonary emboli [facility])   Discharge Instructions:  Activity:  As tolerated with Full fall precautions use walker/cane & assistance as needed  Discharge Instructions    Diet - low sodium heart healthy   Complete by:  As directed    Discharge instructions   Complete by:  As directed    Follow with Primary MD  Sande Brothers, MD  and other consultant's as instructed your Hospitalist MD  Please get a complete blood count and chemistry panel checked by your Primary MD at your next visit, and again as instructed by your Primary MD.  Get Medicines reviewed and adjusted: Please take all your medications with you for your next visit with your Primary MD  Laboratory/radiological data: Please request your Primary MD to go over all hospital tests and procedure/radiological results at the follow up, please ask your Primary MD to get all Hospital records sent to his/her office.  In some cases, they will be blood work, cultures and biopsy results pending at the time of your discharge. Please request that your primary care M.D. follows up on these results.  Also Note the following: If you experience worsening of your admission symptoms, develop shortness of breath, life threatening emergency, suicidal or homicidal thoughts you must seek medical attention immediately by calling 911 or calling your MD immediately  if symptoms less severe.  You must read complete instructions/literature along with all the possible adverse reactions/side effects for all the Medicines you take and that have been  prescribed to you. Take any new Medicines after you have completely understood and accpet all the possible adverse reactions/side effects.   Do not drive when taking Pain medications or sleeping medications (Benzodaizepines)  Do not take more than prescribed Pain, Sleep and Anxiety Medications. It is not advisable to combine anxiety,sleep and pain medications without talking with your primary care practitioner  Special Instructions: If you have smoked or chewed Tobacco  in the last 2 yrs please stop smoking, stop any regular Alcohol  and or any Recreational drug use.  Wear Seat belts while driving.  Please note: You were cared for by a hospitalist during your hospital stay. Once you are discharged, your primary care physician will handle any further medical issues. Please note that NO REFILLS for any discharge medications will be authorized once you are discharged, as it is imperative that you return to your primary care physician (or establish a relationship with a primary care physician if you do not have one) for your post hospital discharge needs so that they can reassess your need for medications and monitor your lab values.   Increase activity slowly   Complete by:  As directed      Allergies as of 03/09/2017      Reactions   Codeine Nausea And Vomiting   Pt doesn't remember this reaction    Tuberculin Tests    Unknown-MAR      Medication List    STOP taking these medications   clopidogrel 75 MG tablet Commonly known as:  PLAVIX     TAKE these medications   acetaminophen 500 MG tablet Commonly known as:  TYLENOL Take  1,000 mg by mouth every 6 (six) hours as needed for mild pain.   alum & mag hydroxide-simeth 200-200-20 MG/5ML suspension Commonly known as:  MAALOX/MYLANTA Take 30 mLs by mouth every 6 (six) hours as needed for indigestion or heartburn.   apixaban 5 MG Tabs tablet Commonly known as:  ELIQUIS Take 2 tablets (10 mg total) by mouth 2 (two) times daily. 7 days  from 03/08/17 and then switch to 5 mg by mouth twice a day from 12/11   apixaban 5 MG Tabs tablet Commonly known as:  ELIQUIS Take 1 tablet (5 mg total) by mouth 2 (two) times daily. First dose on 03/15/17 at 10 AM. Start taking on:  03/15/2017   citalopram 10 MG tablet Commonly known as:  CELEXA Take 10 mg by mouth daily.   diltiazem 180 MG 24 hr capsule Commonly known as:  DILACOR XR Take 1 capsule (180 mg total) by mouth daily.   docusate sodium 100 MG capsule Commonly known as:  COLACE Take 100 mg by mouth daily.   guaifenesin 100 MG/5ML syrup Commonly known as:  ROBITUSSIN Take 200 mg by mouth every 6 (six) hours as needed for cough.   hydrocortisone cream 1 % Apply 1 application topically 2 (two) times daily as needed for itching.   levothyroxine 75 MCG tablet Commonly known as:  SYNTHROID, LEVOTHROID Take 37.5 mcg by mouth daily before breakfast.   loperamide 2 MG capsule Commonly known as:  IMODIUM Take 2 mg by mouth as needed for diarrhea or loose stools.   magnesium hydroxide 400 MG/5ML suspension Commonly known as:  MILK OF MAGNESIA Take 30 mLs by mouth at bedtime as needed for mild constipation.   montelukast 10 MG tablet Commonly known as:  SINGULAIR Take 10 mg by mouth at bedtime.   NAMENDA XR 28 MG Cp24 24 hr capsule Generic drug:  memantine Take 28 mg by mouth daily.   neomycin-bacitracin-polymyxin ointment Commonly known as:  NEOSPORIN Apply 1 application topically every 12 (twelve) hours. As needed for skin tears   nitroGLYCERIN 0.4 MG SL tablet Commonly known as:  NITROSTAT Place 0.4 mg under the tongue every 5 (five) minutes as needed for chest pain.   pantoprazole 40 MG tablet Commonly known as:  PROTONIX Take 1 tablet (40 mg total) by mouth daily.   PRESCRIPTION MEDICATION Take 1 Scoop by mouth daily.   Vitamin D-3 5000 units Tabs Take 1 tablet by mouth daily.            Durable Medical Equipment  (From admission, onward)          Start     Ordered   03/09/17 0956  DME Oxygen  Once    Question Answer Comment  Mode or (Route) Nasal cannula   Liters per Minute 2   Frequency Continuous (stationary and portable oxygen unit needed)   Oxygen conserving device Yes   Oxygen delivery system Gas      03/09/17 0955     Follow-up Information    Sande Brothers, MD. Schedule an appointment as soon as possible for a visit in 1 week(s).   Specialty:  Internal Medicine Contact information: [address] [place] [address] [phone]          Allergies  Allergen Reactions  . Codeine Nausea And Vomiting    Pt doesn't remember this reaction   . Tuberculin Tests     Unknown-MAR    Consultations:   pulmonary/intensive care   Other Procedures/Studies: Dg Chest 2  View  Result Date: 03/05/2017 CLINICAL DATA:  Per order- syncope Staff at [facility] saw pt. To have an episode of emesis at breakfast ?choked?, at which time pt. Was seen to "pass out" and fall. PT HX: ex smoker 1960, COPD, HTN Best images obtained due to mental statu.*comment was truncated* EXAM: CHEST  2 VIEW COMPARISON:  Radiograph 05/30/2016 FINDINGS: Normal cardiac silhouette. Low lung volumes. Density projecting over the LEFT lower lobe is unchanged from prior likely represents a benign rib lesion. IMPRESSION: Low lung volumes.  No acute cardiopulmonary findings Electronically Signed   By: Suzy Bouchard M.D.   On: 03/05/2017 10:53   Ct Angio Chest Pe W And/or Wo Contrast  Result Date: 03/05/2017 CLINICAL DATA:  Shortness of breath. Syncopal episode today. Clinical suspicion for pulmonary embolism. EXAM: CT ANGIOGRAPHY CHEST WITH CONTRAST TECHNIQUE: Multidetector CT imaging of the chest was performed using the standard protocol during bolus administration of intravenous contrast. Multiplanar CT image reconstructions and MIPs were obtained to evaluate the vascular anatomy. CONTRAST:  136mL ISOVUE-370 IOPAMIDOL (ISOVUE-370) INJECTION 76%  COMPARISON:  02/01/2007 FINDINGS: Cardiovascular: Extensive pulmonary embolism is seen in both pulmonary arteries and all lobar branches. RV/LV ratio is 1.4, consistent with right heart strain. No evidence of saddle embolus. Mild cardiomegaly, without pericardial effusion. Aortic and coronary artery atherosclerosis. Mediastinum/Nodes: No evidence of lymphadenopathy or other soft tissue masses. Lungs/Pleura: Mild atelectasis seen in both lower lobes and right upper lobe. No evidence of pulmonary consolidation or pleural effusion. No masses identified. Upper abdomen: No acute findings. Musculoskeletal: No suspicious bone lesions identified. Review of the MIP images confirms the above findings. IMPRESSION: Extensive bilateral pulmonary embolism. CT evidence of right heart strain (RV/LV Ratio = 1.4) consistent with at least submassive (intermediate risk) PE. The presence of right heart strain has been associated with an increased risk of morbidity and mortality. Please activate Code PE by paging [phone]. Critical Value/emergent results were called by telephone at the time of interpretation on 03/05/2017 at 4:16 pm to Dr. Gareth Morgan , who verbally acknowledged these results. Electronically Signed   By: Earle Gell M.D.   On: 03/05/2017 16:18     TODAY-DAY OF DISCHARGE:  Subjective:   Harly Rames today is lying comfortably in bed-per RN no major issues. She remains pleasantly confused  Objective:   Blood pressure 123/87, pulse 65, temperature (!) 97.5 F (36.4 C), temperature source Oral, resp. rate 18, height 5\' 4"  (1.626 m), weight 64.6 kg (142 lb 6.7 oz), SpO2 98 %.  Intake/Output Summary (Last 24 hours) at 03/09/2017 1007 Last data filed at 03/09/2017 0700 Gross per 24 hour  Intake 1490 ml  Output 150 ml  Net 1340 ml   Filed Weights   03/05/17 1730 03/05/17 1900  Weight: 62.1 kg (137 lb) 64.6 kg (142 lb 6.7 oz)    Exam: Awake-pleasantly confused, No new F.N deficits, Normal  affect [place].AT,PERRAL Supple Neck,No JVD, No cervical lymphadenopathy appriciated.  Symmetrical Chest wall movement, Good air movement bilaterally, CTAB RRR,No Gallops,Rubs or new Murmurs, No Parasternal Heave +ve B.Sounds, Abd Soft, Non tender, No organomegaly appriciated, No rebound -guarding or rigidity. No Cyanosis, Clubbing or edema, No new Rash or bruise   PERTINENT RADIOLOGIC STUDIES: Dg Chest 2 View  Result Date: 03/05/2017 CLINICAL DATA:  Per order- syncope Staff at [facility] [employer] saw pt. To have an episode of emesis at breakfast ?choked?, at which time pt. Was seen to "pass out" and fall. PT HX: ex smoker 1960, COPD, HTN Best images  obtained due to mental statu.*comment was truncated* EXAM: CHEST  2 VIEW COMPARISON:  Radiograph 05/30/2016 FINDINGS: Normal cardiac silhouette. Low lung volumes. Density projecting over the LEFT lower lobe is unchanged from prior likely represents a benign rib lesion. IMPRESSION: Low lung volumes.  No acute cardiopulmonary findings Electronically Signed   By: Suzy Bouchard M.D.   On: 03/05/2017 10:53   Ct Angio Chest Pe W And/or Wo Contrast  Result Date: 03/05/2017 CLINICAL DATA:  Shortness of breath. Syncopal episode today. Clinical suspicion for pulmonary embolism. EXAM: CT ANGIOGRAPHY CHEST WITH CONTRAST TECHNIQUE: Multidetector CT imaging of the chest was performed using the standard protocol during bolus administration of intravenous contrast. Multiplanar CT image reconstructions and MIPs were obtained to evaluate the vascular anatomy. CONTRAST:  192mL ISOVUE-370 IOPAMIDOL (ISOVUE-370) INJECTION 76% COMPARISON:  02/01/2007 FINDINGS: Cardiovascular: Extensive pulmonary embolism is seen in both pulmonary arteries and all lobar branches. RV/LV ratio is 1.4, consistent with right heart strain. No evidence of saddle embolus. Mild cardiomegaly, without pericardial effusion. Aortic and coronary artery atherosclerosis. Mediastinum/Nodes: No evidence of  lymphadenopathy or other soft tissue masses. Lungs/Pleura: Mild atelectasis seen in both lower lobes and right upper lobe. No evidence of pulmonary consolidation or pleural effusion. No masses identified. Upper abdomen: No acute findings. Musculoskeletal: No suspicious bone lesions identified. Review of the MIP images confirms the above findings. IMPRESSION: Extensive bilateral pulmonary embolism. CT evidence of right heart strain (RV/LV Ratio = 1.4) consistent with at least submassive (intermediate risk) PE. The presence of right heart strain has been associated with an increased risk of morbidity and mortality. Please activate Code PE by paging [phone]. Critical Value/emergent results were called by telephone at the time of interpretation on 03/05/2017 at 4:16 pm to Dr. Gareth Morgan , who verbally acknowledged these results. Electronically Signed   By: Earle Gell M.D.   On: 03/05/2017 16:18     PERTINENT LAB RESULTS: CBC: Recent Labs    03/08/17 0442 03/09/17 0551  WBC 9.5 7.3  HGB 10.6* 10.5*  HCT 32.3* 31.8*  PLT 186 191   CMET CMP     Component Value Date/Time   NA 141 03/06/2017 0425   K 3.8 03/06/2017 0425   CL 107 03/06/2017 0425   CO2 25 03/06/2017 0425   GLUCOSE 133 (H) 03/06/2017 0425   BUN 25 (H) 03/06/2017 0425   CREATININE 0.87 03/06/2017 0425   CALCIUM 8.9 03/06/2017 0425   PROT 6.2 (L) 03/06/2017 0425   ALBUMIN 3.2 (L) 03/06/2017 0425   AST 18 03/06/2017 0425   ALT 12 (L) 03/06/2017 0425   ALKPHOS 70 03/06/2017 0425   BILITOT 1.1 03/06/2017 0425   GFRNONAA 57 (L) 03/06/2017 0425   GFRAA >60 03/06/2017 0425    GFR Estimated Creatinine Clearance: 37.9 mL/min (by C-G formula based on SCr of 0.87 mg/dL). No results for input(s): LIPASE, AMYLASE in the last 72 hours. No results for input(s): CKTOTAL, CKMB, CKMBINDEX, TROPONINI in the last 72 hours. Invalid input(s): POCBNP No results for input(s): DDIMER in the last 72 hours. No results for input(s):  HGBA1C in the last 72 hours. No results for input(s): CHOL, HDL, LDLCALC, TRIG, CHOLHDL, LDLDIRECT in the last 72 hours. No results for input(s): TSH, T4TOTAL, T3FREE, THYROIDAB in the last 72 hours.  Invalid input(s): FREET3 No results for input(s): VITAMINB12, FOLATE, FERRITIN, TIBC, IRON, RETICCTPCT in the last 72 hours. Coags: Recent Labs    03/07/17 1746  INR 1.21   Microbiology: Recent Results (from the past 240  hour(s))  Urine culture     Status: None   Collection Time: 03/05/17 12:10 PM  Result Value Ref Range Status   Specimen Description URINE, RANDOM  Final   Special Requests NONE  Final   Culture   Final    NO GROWTH Performed at [facility], [address], [place], [place] [address]    Report Status 03/06/2017 FINAL  Final  MRSA PCR Screening     Status: None   Collection Time: 03/05/17  6:54 PM  Result Value Ref Range Status   MRSA by PCR NEGATIVE NEGATIVE Final    Comment:        The GeneXpert MRSA Assay (FDA approved for NASAL specimens only), is one component of a comprehensive MRSA colonization surveillance program. It is not intended to diagnose MRSA infection nor to guide or monitor treatment for MRSA infections.     FURTHER DISCHARGE INSTRUCTIONS:  Get Medicines reviewed and adjusted: Please take all your medications with you for your next visit with your Primary MD  Laboratory/radiological data: Please request your Primary MD to go over all hospital tests and procedure/radiological results at the follow up, please ask your Primary MD to get all Hospital records sent to his/her office.  In some cases, they will be blood work, cultures and biopsy results pending at the time of your discharge. Please request that your primary care M.D. goes through all the records of your hospital data and follows up on these results.  Also Note the following: If you experience worsening of your admission symptoms, develop shortness of breath, life  threatening emergency, suicidal or homicidal thoughts you must seek medical attention immediately by calling 911 or calling your MD immediately  if symptoms less severe.  You must read complete instructions/literature along with all the possible adverse reactions/side effects for all the Medicines you take and that have been prescribed to you. Take any new Medicines after you have completely understood and accpet all the possible adverse reactions/side effects.   Do not drive when taking Pain medications or sleeping medications (Benzodaizepines)  Do not take more than prescribed Pain, Sleep and Anxiety Medications. It is not advisable to combine anxiety,sleep and pain medications without talking with your primary care practitioner  Special Instructions: If you have smoked or chewed Tobacco  in the last 2 yrs please stop smoking, stop any regular Alcohol  and or any Recreational drug use.  Wear Seat belts while driving.  Please note: You were cared for by a hospitalist during your hospital stay. Once you are discharged, your primary care physician will handle any further medical issues. Please note that NO REFILLS for any discharge medications will be authorized once you are discharged, as it is imperative that you return to your primary care physician (or establish a relationship with a primary care physician if you do not have one) for your post hospital discharge needs so that they can reassess your need for medications and monitor your lab values.  Total Time spent coordinating discharge including counseling, education and face to face time equals 45 minutes.  Signed: Shanker Ghimire 03/09/2017 10:07 AM

## 2017-03-09 NOTE — Progress Notes (Addendum)
Call placed to [facility], spoke to [place], Emergency planning/management officer regarding medical equipment delivery (oxygen). [facility] representative stated oxygen had not been delivered. Report called, questions, concerns denied.Will follow up.

## 2017-03-09 NOTE — Progress Notes (Signed)
[facility] to [facility] regarding [facility] delivery, awaiting return call.

## 2017-03-10 NOTE — Progress Notes (Signed)
Received update from Charge nurse today that patient did not d/c until 9p from [facility] going to [facility]. [place] Reason [phone] that [facility] did not deliver home 02 concentrator until 6-7p, they did not set up the concentrator. The stafff @ [facility] weere not able to set up concentrator. The facility had a concentrator available that they used on their own. Patient did not arrive to facility until 9p. I have provided [place] w/[facility] [employer] contact person, & tel#. I will update my administration. No further CM involvement.

## 2017-06-02 ENCOUNTER — Emergency Department (HOSPITAL_COMMUNITY)
Admission: EM | Admit: 2017-06-02 | Discharge: 2017-06-02 | Disposition: A | Discharge status: Home / Self Care | Attending: Emergency Medicine | Admitting: Emergency Medicine

## 2017-06-02 ENCOUNTER — Other Ambulatory Visit: Payer: Self-pay

## 2017-06-02 DIAGNOSIS — E039 Hypothyroidism, unspecified: Secondary | ICD-10-CM | POA: Insufficient documentation

## 2017-06-02 DIAGNOSIS — I252 Old myocardial infarction: Secondary | ICD-10-CM | POA: Insufficient documentation

## 2017-06-02 DIAGNOSIS — J449 Chronic obstructive pulmonary disease, unspecified: Secondary | ICD-10-CM | POA: Diagnosis not present

## 2017-06-02 DIAGNOSIS — R791 Abnormal coagulation profile: Secondary | ICD-10-CM | POA: Diagnosis present

## 2017-06-02 DIAGNOSIS — Z79899 Other long term (current) drug therapy: Secondary | ICD-10-CM | POA: Insufficient documentation

## 2017-06-02 DIAGNOSIS — Z87891 Personal history of nicotine dependence: Secondary | ICD-10-CM | POA: Diagnosis not present

## 2017-06-02 DIAGNOSIS — F0280 Dementia in other diseases classified elsewhere without behavioral disturbance: Secondary | ICD-10-CM | POA: Diagnosis not present

## 2017-06-02 DIAGNOSIS — N183 Chronic kidney disease, stage 3 (moderate): Secondary | ICD-10-CM | POA: Insufficient documentation

## 2017-06-02 DIAGNOSIS — Z7901 Long term (current) use of anticoagulants: Secondary | ICD-10-CM | POA: Diagnosis not present

## 2017-06-02 DIAGNOSIS — I129 Hypertensive chronic kidney disease with stage 1 through stage 4 chronic kidney disease, or unspecified chronic kidney disease: Secondary | ICD-10-CM | POA: Diagnosis not present

## 2017-06-02 DIAGNOSIS — I2510 Atherosclerotic heart disease of native coronary artery without angina pectoris: Secondary | ICD-10-CM | POA: Insufficient documentation

## 2017-06-02 DIAGNOSIS — G309 Alzheimer's disease, unspecified: Secondary | ICD-10-CM | POA: Diagnosis not present

## 2017-06-02 LAB — CBC
HCT: 35.5 % — ABNORMAL LOW (ref 36.0–46.0)
HEMOGLOBIN: 11.3 g/dL — AB (ref 12.0–15.0)
MCH: 25 pg — AB (ref 26.0–34.0)
MCHC: 31.8 g/dL (ref 30.0–36.0)
MCV: 78.5 fL (ref 78.0–100.0)
PLATELETS: 263 10*3/uL (ref 150–400)
RBC: 4.52 MIL/uL (ref 3.87–5.11)
RDW: 15.3 % (ref 11.5–15.5)
WBC: 7.2 10*3/uL (ref 4.0–10.5)

## 2017-06-02 LAB — COMPREHENSIVE METABOLIC PANEL
ALBUMIN: 3.3 g/dL — AB (ref 3.5–5.0)
ALK PHOS: 88 U/L (ref 38–126)
ALT: 9 U/L — AB (ref 14–54)
AST: 14 U/L — ABNORMAL LOW (ref 15–41)
Anion gap: 13 (ref 5–15)
BUN: 17 mg/dL (ref 6–20)
CALCIUM: 9.2 mg/dL (ref 8.9–10.3)
CHLORIDE: 97 mmol/L — AB (ref 101–111)
CO2: 28 mmol/L (ref 22–32)
CREATININE: 0.97 mg/dL (ref 0.44–1.00)
GFR calc non Af Amer: 50 mL/min — ABNORMAL LOW (ref >60)
GFR, EST AFRICAN AMERICAN: 58 mL/min — AB (ref >60)
GLUCOSE: 117 mg/dL — AB (ref 65–99)
Potassium: 3.7 mmol/L (ref 3.5–5.1)
SODIUM: 138 mmol/L (ref 135–145)
Total Bilirubin: 0.6 mg/dL (ref 0.3–1.2)
Total Protein: 6.8 g/dL (ref 6.5–8.1)

## 2017-06-02 LAB — PROTIME-INR
INR: 4.33 — AB
PROTHROMBIN TIME: 41.2 s — AB (ref 11.4–15.2)

## 2017-06-02 LAB — APTT: APTT: 66 s — AB (ref 24–36)

## 2017-06-02 NOTE — ED Notes (Signed)
Call back to [facility] for transport back to [facility]

## 2017-06-02 NOTE — ED Notes (Signed)
Call back to [facility] for transport.  They state that [facility] has to call for transport.  Will call hosp[facility] of [facility]

## 2017-06-02 NOTE — ED Notes (Signed)
Called to [facility] to verify that pt is a hosp[facility] of [facility] pt.  Also gave report to staff at [facility]

## 2017-06-02 NOTE — ED Notes (Signed)
Call to [facility] to check about transport back

## 2017-06-02 NOTE — ED Notes (Signed)
Call to [facility] communtication for transport back to [facility].  Notified them that this is a hospice pt.  They state that I have to confirm that it is specifically a hospice of [facility] pt before they can put her down for transport

## 2017-06-02 NOTE — ED Notes (Signed)
Hospice notified of patients status

## 2017-06-02 NOTE — ED Provider Notes (Signed)
[facility] DEPT Provider Note   CSN: [id number] Arrival date & time: 06/02/17  1339     History   Chief Complaint Chief Complaint  Patient presents with  . Abnormal Labs    HPI [name] is a 84 y.o. female with PMH/o Alzheimer's who is currently undergoing hospice care who presents for evaluation of abnormal lab value. Per healthcare worker who is here with patient, patient's INR was report as elevated today. Patient was prompted to go to the ED to get [facility] redrawn. Patient is on coumadin. Healthcare work denies any changes in behavior. She does note pateint has been holding her neck but denies any falls, trauma, injury. Healthcare denies any bleeding.   The history is provided by the nursing home and a caregiver.    Past Medical History:  Diagnosis Date  . Alzheimer disease prolapsed bladder  . Anxiety   . Atrial fibrillation ([place])   . CAD (coronary artery disease), native coronary artery    H/O PTCA, balloon angioplasty Mid LAD in 2001, 4.0x18 vision in proximal RCA 3.2010 and cardiac cath 01/2009 revealed patent PCI sites  . Chronic kidney disease, stage III (moderate) (HCC)   . COPD (chronic obstructive pulmonary disease) ([place])   . Dyslipidemia   . GERD (gastroesophageal reflux disease)   . H/O: GI bleed    Massive rectal bleed 2008. Not a coumadin candidate. A. Fib without recurrence.    Marland Kitchen Hx: UTI (urinary tract infection)   . Hypertension   . Hypothyroid     Patient Active Problem List   Diagnosis Date Noted  . Pulmonary emboli ([place]) 03/05/2017  . Hyperkalemia 05/30/2016  . AKI (acute kidney injury) ([place]) 01/10/2015  . Atrial fibrillation ([place]) 01/09/2015  . Pain in the chest   . Fall 03/21/2011  . Hypokalemia 03/18/2011  . CAD (coronary artery disease) 03/18/2011  . Stented coronary artery 03/18/2011  . Atrial fibrillation with RVR ([place])   . Alzheimer disease   . Anxiety   . Hypertension   . COPD (chronic obstructive  pulmonary disease) ([place])   . Chronic kidney disease, stage III (moderate) (HCC)   . GERD (gastroesophageal reflux disease)   . Dyslipidemia   . Hypothyroid     Past Surgical History:  Procedure Laterality Date  . ABDOMINAL HYSTERECTOMY    . CORONARY ANGIOPLASTY WITH STENT PLACEMENT  bladder tacking  . myocardial infarct    . TOTAL VAGINAL HYSTERECTOMY      OB History    Gravida Para Term Preterm AB Living   4 4 4     4    SAB TAB Ectopic Multiple Live Births                   Home Medications    Prior to Admission medications   Medication Sig Start Date End Date Taking? Authorizing Provider  acetaminophen (TYLENOL) 500 MG tablet Take 1,000 mg by mouth every 6 (six) hours as needed for mild pain.    Yes [provider]  alum & mag hydroxide-simeth (MAALOX/MYLANTA) 200-200-20 MG/5ML suspension Take 30 mLs by mouth every 6 (six) hours as needed for indigestion or heartburn.   Yes [provider]  citalopram (CELEXA) 10 MG tablet Take 10 mg by mouth daily.     Yes [provider]  diltiazem (DILACOR XR) 180 MG 24 hr capsule Take 1 capsule (180 mg total) by mouth daily. 01/11/15  Yes Burns, Arloa Koh, MD  docusate sodium (COLACE) 100 MG  capsule Take 100 mg by mouth daily.   Yes [provider]  guaifenesin (ROBITUSSIN) 100 MG/5ML syrup Take 200 mg by mouth every 6 (six) hours as needed for cough.    Yes [provider]  hydrocortisone cream 1 % Apply 1 application topically 2 (two) times daily as needed for itching.   Yes [provider]  levothyroxine (SYNTHROID, LEVOTHROID) 75 MCG tablet Take 37.5 mcg by mouth daily before breakfast.   Yes [provider]  loperamide (IMODIUM) 2 MG capsule Take 2 mg by mouth as needed for diarrhea or loose stools.   Yes [provider]  magnesium hydroxide (MILK OF MAGNESIA) 400 MG/5ML suspension Take 30 mLs by mouth at bedtime as needed for mild constipation.    Yes [provider]  Memantine HCl ER (NAMENDA XR) 28 MG CP24 Take 28 mg by mouth daily.   Yes [provider]  neomycin-bacitracin-polymyxin (NEOSPORIN) ointment Apply 1 application topically every 12 (twelve) hours. As needed for skin tears   Yes [provider]  nitroGLYCERIN (NITROSTAT) 0.4 MG SL tablet Place 0.4 mg under the tongue every 5 (five) minutes as needed for chest pain.   Yes [provider]  Nutritional Supplements (NUTRITIONAL SHAKE PO) Take 1 Bottle by mouth 4 (four) times daily.   Yes [provider]  pantoprazole (PROTONIX) 40 MG tablet Take 1 tablet (40 mg total) by mouth daily. 03/09/17 03/09/18 Yes Ghimire, Henreitta Leber, MD  Saline (NASOGEL) GEL Place 1 application into the nose 3 (three) times daily.   Yes [provider]  warfarin (COUMADIN) 1 MG tablet Take 1 mg by mouth See admin instructions. Take 1 mg by mouth every Monday and Thursday, take 2.5 mg by mouth every Sunday, Tuesday, Wednesday, Friday and Saturday   Yes [provider]  warfarin (COUMADIN) 2.5 MG tablet Take 2.5 mg by mouth See admin instructions. Take 1 mg by mouth every Monday and Thursday, take 2.5 mg by mouth every Sunday, Tuesday, Wednesday, Friday and Saturday   Yes [provider]  apixaban (ELIQUIS) 5 MG TABS tablet Take 1 tablet (5 mg total) by mouth 2 (two) times daily. First dose on 03/15/17 at 10 AM. 03/15/17   Ghimire, Henreitta Leber, MD  apixaban (ELIQUIS) 5 MG TABS tablet Take 2 tablets (10 mg total) by mouth 2 (two) times daily. 7 days from 03/08/17 and then switch to 5 mg by mouth twice a day from 12/11 03/09/17   Jonetta Osgood, MD    Family History No family history on file.  Social History Social History   Tobacco Use  . Smoking status: Former Smoker    Last attempt to quit: 03/18/1959    Years since quitting: 58.2  . Smokeless tobacco: Never Used  Substance Use Topics  . Alcohol use: No    Alcohol/week: 0.6 oz    Types: 1  Glasses of wine per week  . Drug use: No     Allergies   Codeine and Tuberculin tests   Review of Systems Review of Systems  Unable to perform ROS: Dementia     Physical Exam Updated Vital Signs BP 111/61 (BP Location: Right Arm)   Pulse 97   Temp 99 F (37.2 C) (Oral)   Resp 13   SpO2 100%   Physical Exam  Constitutional: She appears well-developed and well-nourished.  Frail and elderly appearing   HENT:  Head: Normocephalic and atraumatic.  Mouth/Throat: Oropharynx is clear and moist and mucous membranes  are normal.  No tenderness to palpation of skull. No deformities or crepitus noted. No open wounds, abrasions or lacerations.   Eyes: Conjunctivae, EOM and lids are normal. Pupils are equal, round, and reactive to light.  Neck: Full passive range of motion without pain.  Full flexion/extension and lateral movement of neck fully intact. No bony midline tenderness. No deformities or crepitus.   Cardiovascular: Normal rate, regular rhythm, normal heart sounds and normal pulses. Exam reveals no gallop and no friction rub.  No murmur heard. Pulmonary/Chest: Effort normal and breath sounds normal.  Abdominal: Soft. Normal appearance. There is no tenderness. There is no rigidity and no guarding.  Musculoskeletal: Normal range of motion.  Neurological: She is alert.  Alert and oriented to person and place, not time.  Follows commands, Moves all extremities  5/5 strength to BUE and BLE  Sensation intact throughout all major nerve distributions  Skin: Skin is warm and dry. Capillary refill takes less than 2 seconds.  Psychiatric: She has a normal mood and affect. Her speech is normal.  Nursing note and vitals reviewed.    ED Treatments / Results  Labs (all labs ordered are listed, but only abnormal results are displayed) Labs Reviewed  APTT - Abnormal; Notable for the following components:      Result Value   aPTT 66 (*)    All other components within normal limits    PROTIME-INR - Abnormal; Notable for the following components:   Prothrombin Time 41.2 (*)    INR 4.33 (*)    All other components within normal limits  CBC - Abnormal; Notable for the following components:   Hemoglobin 11.3 (*)    HCT 35.5 (*)    MCH 25.0 (*)    All other components within normal limits  COMPREHENSIVE METABOLIC PANEL - Abnormal; Notable for the following components:   Chloride 97 (*)    Glucose, Bld 117 (*)    Albumin 3.3 (*)    AST 14 (*)    ALT 9 (*)    GFR calc non Af Amer 50 (*)    GFR calc Af Amer 58 (*)    All other components within normal limits    EKG  EKG Interpretation None       Radiology No results found.  Procedures Procedures (including critical care time)  Medications Ordered in ED Medications - No data to display   Initial Impression / Assessment and Plan / ED Course  I have reviewed the triage vital signs and the nursing notes.  Pertinent labs & imaging results that were available during my care of the patient were reviewed by me and considered in my medical decision making (see chart for details).     84 y.o. F with PMH/o Alzheimer's who is currently in hospice care presents for evaluation of elevated INR. INR was 7 at [facility] facility. Patient currently on Coumadin. No bleeding. Patient is afebrile, non-toxic appearing, sitting comfortably on examination table. Vital signs reviewed and stable. Plan to repeat labs in the ED and will check INR in the ED.   CMP unremarkable. CBC shows stable anemia. INR is 4.33. Instructed to have patinet's coumadin held for 24 hours and repeat INR. Discussed patient with Dr. Marcha Dutton who agrees with plan.   Final Clinical Impressions(s) / ED Diagnoses   Final diagnoses:  Elevated INR    ED Discharge Orders    None       Volanda Napoleon, PA-C 06/03/17 0433    Mabe,  Forbes Cellar, MD 06/04/17 [phone]

## 2017-06-02 NOTE — ED Notes (Signed)
Pt has been in no distress, being transported back by ems

## 2017-06-02 NOTE — ED Triage Notes (Signed)
Per EMS: Pt BIB EMS from [place]. Pt is a hospice pt. Pt has a hx of alzheimers, COPD, and thyroid disease.  Pt denies any complaints. Pt denies any pains.   Facility sent pt because her labs showed that her INR level was abnormal and they wanted a recheck.

## 2017-06-02 NOTE — Progress Notes (Addendum)
[facility] Liaison:  RN visit  Patient alert and disoriented when I met with her at bedside.  MD has not yet seen the patient.  Patient holding her neck on R back side.  She advised she is having some pain and seems very uncomfortable.  I straightened up her pillow and she did say "thank you".  She didn't converse beyond that.    Hospital RN was at bedside to draw labs for patient.  Waiting on INR results at this time.    Offered emotional support and patient seemed to appreciate.  We will continue to monitor while patient is in the hospital.  If patient discharges back to facility, please use [facility] for transport as patient is an already established [facility] patient.  Thank you.  Edyth Gunnels, RN, BSN [facility] Liaison [phone]  All hospital liaisons are now in [place].

## 2017-06-02 NOTE — ED Notes (Signed)
INR was reported at 7.4

## 2017-06-02 NOTE — Discharge Instructions (Signed)
Patient's coumadin should be held today and tomorrow and she should have her INR rechecked tomorrow. If INR begins to normalize, patient can resume her Coudmadin schedule.   Return to the Emergency Department for any any bleeding or any other conerns.

## 2017-06-02 NOTE — ED Notes (Signed)
Bed: [facility] Expected date:  Expected time:  Means of arrival:  Comments: 

## 2018-01-03 DEATH — deceased
